# Patient Record
Sex: Female | Born: 1975 | Race: White | Hispanic: No | Marital: Married | Smoking: Never smoker
Health system: Southern US, Community
[De-identification: ages and names within clinical notes are randomized; demographics above are authoritative.]

## PROBLEM LIST (undated history)

## (undated) DIAGNOSIS — R51 Headache: Secondary | ICD-10-CM

## (undated) DIAGNOSIS — R12 Heartburn: Secondary | ICD-10-CM

## (undated) DIAGNOSIS — Z87410 Personal history of cervical dysplasia: Secondary | ICD-10-CM

## (undated) DIAGNOSIS — K219 Gastro-esophageal reflux disease without esophagitis: Secondary | ICD-10-CM

## (undated) DIAGNOSIS — C801 Malignant (primary) neoplasm, unspecified: Secondary | ICD-10-CM

## (undated) DIAGNOSIS — N893 Dysplasia of vagina, unspecified: Secondary | ICD-10-CM

## (undated) DIAGNOSIS — Z86018 Personal history of other benign neoplasm: Secondary | ICD-10-CM

## (undated) DIAGNOSIS — D179 Benign lipomatous neoplasm, unspecified: Secondary | ICD-10-CM

## (undated) DIAGNOSIS — R519 Headache, unspecified: Secondary | ICD-10-CM

## (undated) HISTORY — DX: Benign lipomatous neoplasm, unspecified: D17.9

## (undated) HISTORY — DX: Heartburn: R12

## (undated) HISTORY — PX: DILATION AND CURETTAGE OF UTERUS: SHX78

## (undated) HISTORY — PX: LUMBAR DISC SURGERY: SHX700

## (undated) HISTORY — PX: LAPAROSCOPIC SALPINGO OOPHERECTOMY: SHX5927

## (undated) HISTORY — PX: WISDOM TOOTH EXTRACTION: SHX21

## (undated) HISTORY — DX: Dysplasia of vagina, unspecified: N89.3

## (undated) HISTORY — PX: LAPAROSCOPIC TUBAL LIGATION W/ FILCHIE CLIPS: SHX1938

---

## 1995-06-25 HISTORY — PX: WISDOM TOOTH EXTRACTION: SHX21

## 1999-10-30 ENCOUNTER — Other Ambulatory Visit: Admission: RE | Admit: 1999-10-30 | Discharge: 1999-10-30 | Payer: Self-pay | Admitting: Family Medicine

## 2000-03-21 ENCOUNTER — Encounter: Admission: RE | Admit: 2000-03-21 | Discharge: 2000-03-21 | Payer: Self-pay | Admitting: Family Medicine

## 2000-03-21 ENCOUNTER — Encounter: Payer: Self-pay | Admitting: Family Medicine

## 2000-10-29 ENCOUNTER — Other Ambulatory Visit: Admission: RE | Admit: 2000-10-29 | Discharge: 2000-10-29 | Payer: Self-pay | Admitting: Family Medicine

## 2000-12-17 ENCOUNTER — Other Ambulatory Visit: Admission: RE | Admit: 2000-12-17 | Discharge: 2000-12-17 | Payer: Self-pay | Admitting: *Deleted

## 2000-12-18 ENCOUNTER — Encounter (INDEPENDENT_AMBULATORY_CARE_PROVIDER_SITE_OTHER): Payer: Self-pay

## 2000-12-18 ENCOUNTER — Other Ambulatory Visit: Admission: RE | Admit: 2000-12-18 | Discharge: 2000-12-18 | Payer: Self-pay | Admitting: *Deleted

## 2001-09-15 ENCOUNTER — Other Ambulatory Visit: Admission: RE | Admit: 2001-09-15 | Discharge: 2001-09-15 | Payer: Self-pay | Admitting: Family Medicine

## 2002-03-09 ENCOUNTER — Encounter: Payer: Self-pay | Admitting: Family Medicine

## 2002-03-09 ENCOUNTER — Encounter: Admission: RE | Admit: 2002-03-09 | Discharge: 2002-03-09 | Payer: Self-pay | Admitting: Family Medicine

## 2002-03-15 ENCOUNTER — Other Ambulatory Visit: Admission: RE | Admit: 2002-03-15 | Discharge: 2002-03-15 | Payer: Self-pay | Admitting: Obstetrics and Gynecology

## 2002-04-06 ENCOUNTER — Encounter: Payer: Self-pay | Admitting: Orthopaedic Surgery

## 2002-04-06 ENCOUNTER — Ambulatory Visit (HOSPITAL_COMMUNITY): Admission: RE | Admit: 2002-04-06 | Discharge: 2002-04-06 | Payer: Self-pay | Admitting: Orthopaedic Surgery

## 2002-11-25 ENCOUNTER — Other Ambulatory Visit: Admission: RE | Admit: 2002-11-25 | Discharge: 2002-11-25 | Payer: Self-pay | Admitting: Obstetrics and Gynecology

## 2002-11-26 ENCOUNTER — Other Ambulatory Visit: Admission: RE | Admit: 2002-11-26 | Discharge: 2002-11-26 | Payer: Self-pay | Admitting: Obstetrics & Gynecology

## 2003-03-12 ENCOUNTER — Inpatient Hospital Stay (HOSPITAL_COMMUNITY): Admission: AD | Admit: 2003-03-12 | Discharge: 2003-03-12 | Payer: Self-pay | Admitting: Obstetrics and Gynecology

## 2003-04-28 ENCOUNTER — Inpatient Hospital Stay (HOSPITAL_COMMUNITY): Admission: AD | Admit: 2003-04-28 | Discharge: 2003-04-29 | Payer: Self-pay | Admitting: Obstetrics & Gynecology

## 2003-05-24 ENCOUNTER — Inpatient Hospital Stay (HOSPITAL_COMMUNITY): Admission: AD | Admit: 2003-05-24 | Discharge: 2003-05-27 | Payer: Self-pay | Admitting: Obstetrics and Gynecology

## 2003-06-03 ENCOUNTER — Encounter: Admission: RE | Admit: 2003-06-03 | Discharge: 2003-07-03 | Payer: Self-pay | Admitting: Obstetrics and Gynecology

## 2003-12-29 ENCOUNTER — Other Ambulatory Visit: Admission: RE | Admit: 2003-12-29 | Discharge: 2003-12-29 | Payer: Self-pay | Admitting: Obstetrics and Gynecology

## 2004-11-29 ENCOUNTER — Other Ambulatory Visit: Admission: RE | Admit: 2004-11-29 | Discharge: 2004-11-29 | Payer: Self-pay | Admitting: Obstetrics and Gynecology

## 2005-05-25 ENCOUNTER — Emergency Department (HOSPITAL_COMMUNITY): Admission: EM | Admit: 2005-05-25 | Discharge: 2005-05-25 | Payer: Self-pay | Admitting: Emergency Medicine

## 2006-08-30 ENCOUNTER — Encounter
Admission: RE | Admit: 2006-08-30 | Discharge: 2006-08-30 | Payer: Self-pay | Admitting: Physical Medicine and Rehabilitation

## 2006-09-18 ENCOUNTER — Encounter
Admission: RE | Admit: 2006-09-18 | Discharge: 2006-11-06 | Payer: Self-pay | Admitting: Physical Medicine and Rehabilitation

## 2007-01-30 ENCOUNTER — Ambulatory Visit (HOSPITAL_COMMUNITY): Admission: RE | Admit: 2007-01-30 | Discharge: 2007-01-31 | Payer: Self-pay | Admitting: Orthopaedic Surgery

## 2007-01-30 HISTORY — PX: BACK SURGERY: SHX140

## 2007-10-13 ENCOUNTER — Encounter: Admission: RE | Admit: 2007-10-13 | Discharge: 2007-10-13 | Payer: Self-pay | Admitting: Obstetrics & Gynecology

## 2008-12-03 ENCOUNTER — Encounter: Admission: RE | Admit: 2008-12-03 | Discharge: 2008-12-03 | Payer: Self-pay | Admitting: Orthopaedic Surgery

## 2009-01-02 ENCOUNTER — Encounter: Admission: RE | Admit: 2009-01-02 | Discharge: 2009-01-02 | Payer: Self-pay | Admitting: Obstetrics and Gynecology

## 2009-08-05 ENCOUNTER — Ambulatory Visit (HOSPITAL_COMMUNITY): Admission: AD | Admit: 2009-08-05 | Discharge: 2009-08-05 | Payer: Self-pay | Admitting: Obstetrics and Gynecology

## 2009-08-05 HISTORY — PX: DILATION AND CURETTAGE OF UTERUS: SHX78

## 2010-02-12 ENCOUNTER — Encounter: Admission: RE | Admit: 2010-02-12 | Discharge: 2010-02-12 | Payer: Self-pay | Admitting: Obstetrics and Gynecology

## 2010-03-20 ENCOUNTER — Ambulatory Visit (HOSPITAL_COMMUNITY): Admission: RE | Admit: 2010-03-20 | Discharge: 2010-03-20 | Payer: Self-pay | Admitting: Obstetrics and Gynecology

## 2010-03-20 HISTORY — PX: TUBAL LIGATION: SHX77

## 2010-09-06 LAB — CBC
HCT: 44 % (ref 36.0–46.0)
MCV: 92.1 fL (ref 78.0–100.0)
Platelets: 307 10*3/uL (ref 150–400)
RBC: 4.77 MIL/uL (ref 3.87–5.11)
WBC: 7.1 10*3/uL (ref 4.0–10.5)

## 2010-09-06 LAB — SURGICAL PCR SCREEN: MRSA, PCR: NEGATIVE

## 2010-09-06 LAB — PREGNANCY, URINE: Preg Test, Ur: NEGATIVE

## 2010-09-12 LAB — CBC
HCT: 42.1 % (ref 36.0–46.0)
Platelets: 266 10*3/uL (ref 150–400)
WBC: 10 10*3/uL (ref 4.0–10.5)

## 2010-09-12 LAB — DIFFERENTIAL
Basophils Absolute: 0 10*3/uL (ref 0.0–0.1)
Eosinophils Relative: 1 % (ref 0–5)
Lymphocytes Relative: 28 % (ref 12–46)
Lymphs Abs: 2.8 10*3/uL (ref 0.7–4.0)
Neutro Abs: 6.6 10*3/uL (ref 1.7–7.7)
Neutrophils Relative %: 66 % (ref 43–77)

## 2010-09-12 LAB — TYPE AND SCREEN
ABO/RH(D): B POS
Antibody Screen: NEGATIVE

## 2010-09-12 LAB — ABO/RH: ABO/RH(D): B POS

## 2010-10-05 ENCOUNTER — Emergency Department (HOSPITAL_COMMUNITY)
Admission: EM | Admit: 2010-10-05 | Discharge: 2010-10-05 | Disposition: A | Discharge status: Home / Self Care | Payer: Medicaid Other | Attending: Emergency Medicine | Admitting: Emergency Medicine

## 2010-10-05 DIAGNOSIS — R55 Syncope and collapse: Secondary | ICD-10-CM | POA: Insufficient documentation

## 2010-10-05 DIAGNOSIS — R110 Nausea: Secondary | ICD-10-CM | POA: Insufficient documentation

## 2010-10-05 DIAGNOSIS — R42 Dizziness and giddiness: Secondary | ICD-10-CM | POA: Insufficient documentation

## 2010-10-05 DIAGNOSIS — R61 Generalized hyperhidrosis: Secondary | ICD-10-CM | POA: Insufficient documentation

## 2010-10-05 DIAGNOSIS — R5383 Other fatigue: Secondary | ICD-10-CM | POA: Insufficient documentation

## 2010-10-05 DIAGNOSIS — R5381 Other malaise: Secondary | ICD-10-CM | POA: Insufficient documentation

## 2010-10-25 ENCOUNTER — Other Ambulatory Visit (HOSPITAL_COMMUNITY): Payer: Self-pay | Admitting: Family Medicine

## 2010-10-25 DIAGNOSIS — L989 Disorder of the skin and subcutaneous tissue, unspecified: Secondary | ICD-10-CM

## 2010-10-30 ENCOUNTER — Ambulatory Visit (HOSPITAL_COMMUNITY)
Admission: RE | Admit: 2010-10-30 | Discharge: 2010-10-30 | Disposition: A | Discharge status: Home / Self Care | Payer: Medicaid Other | Source: Ambulatory Visit | Attending: Family Medicine | Admitting: Family Medicine

## 2010-10-30 DIAGNOSIS — R229 Localized swelling, mass and lump, unspecified: Secondary | ICD-10-CM | POA: Insufficient documentation

## 2010-10-30 DIAGNOSIS — L989 Disorder of the skin and subcutaneous tissue, unspecified: Secondary | ICD-10-CM

## 2010-11-06 NOTE — Op Note (Signed)
NAMEASHLEY, Maria Velasquez                ACCOUNT NO.:  [id number]   MEDICAL RECORD NO.:  [id number]          PATIENT TYPE:  OIB   LOCATION:  5015                         FACILITY:  [facility]   PHYSICIAN:  Mark C. Ophelia Charter, M.D.    DATE OF BIRTH:  August 09, 1975   DATE OF PROCEDURE:  01/30/2007  DATE OF DISCHARGE:                               OPERATIVE REPORT   PREOPERATIVE DIAGNOSIS:  Left L4-5, L5-S1 herniated nucleus pulposus.   POSTOPERATIVE DIAGNOSIS:  Left L4-5, L5-S1 herniated nucleus pulposus.   PROCEDURES:  1. Left L4-5 microdiskectomy and foraminotomy.  2. Left L5-S1 microdiskectomy and foraminotomy.  (Two-level      procedure).   SURGEON:  Annell Greening, MD.   ANESTHESIA:  GOT plus Marcaine skin local.   ASSISTANT:  Maud Deed, PA-C.   ESTIMATED BLOOD LOSS:  Less than 100 mL.   This 35 year old female has HNP central and left at 4-5, 5-1 with  foraminal stenosis not responsive to a rest, anti-inflammatories, muscle  relaxants, pain medication, physical therapy and repeat epidural steroid  injections.  She has elected to proceed with microdiskectomy and  foraminotomy for relief of the nerve root compression.   PROCEDURE:  After induction of general anesthesia and time-out  procedure, the patient placed in kneeling position on the [place] frame.  Careful padding and positioning.  Area squared with towels after  Betadine preparation with the DuraPrep and Betadine, application  laminectomy sheets, drapes.  Incision was made based on palpable  landmarks.  Exposure of 4-5, 5-1 was performed and a spinal needle was  placed at the 5-1 interspace and Penfield IV was placed down between the  lamina and the 4 and 5 and cross-table lateral x-ray confirmed this  appropriate two levels.  Foraminotomy was performed at both levels, 5-1  level was surgically treated first.  The ligament was divided and then  removed with the [place].  There was significant facet overhang  displacing the nerve  root toward the midline and this was trimmed back.  This nerve root went underneath the pedicle.  There was overhang from  the facets more significant than seen on radiographs.  Disk was bulging  annulus, was incised.  Passes were made with the micropituitary then  regular pituitary.  With the upbiting micropituitary, a large glob of  disk was removed which was left paracentral.  Epstein curettes were  used.  Once the ligament was removed from the gutter and facet overhang  spur, the nerve root was decompressed, a hockey stick was able to sweep  anterior to the dura with no areas of compression.  Small veins were  coagulated.  Once the nerve root was completely free and palpation above  and below, behind and on top of the nerve root had been performed,  a  Taylor retractor was placed at the 4-5 space.  Identical procedure was  performed at this level.  Again there was facet overhang that was  causing nerve root compression in addition to the bulging disk which was  left paracentral.  Annulus was incised and disk material was removed as  well  as the ligaments in the lateral gutter.  There were multiple small  veins requiring use of the bipolar.  The nerve hook was used for  palpation along the lateral edge and bone was removed out to the level  of the pedicle.  Foramen was enlarged, nerve root was free, no bulging  of the disk in the midline after slipping underneath the ligament and  pushing down the disk material in the center of the disk and then  removing it with the pituitaries.  A 180 degree sweep was made anterior  to the dura, no areas of compression, nerve root was free.  After  irrigation with saline solution, operative field was dry.  Deep fascia  was closed with 0 Vicryl, 2-0 Vicryl in subcutaneous tissue, 4-0 Vicryl  subcuticular closure.  Tincture of Benzoin and Steri-Strips, Marcaine  infiltration, postop dressing and transferred to her room.  Instrument,  needle count and  pad count was correct.      Mark C. Ophelia Charter, M.D.  Electronically Signed     MCY/MEDQ  D:  01/30/2007  T:  01/30/2007  Job:  [id number]

## 2010-12-10 ENCOUNTER — Other Ambulatory Visit: Payer: Self-pay | Admitting: Obstetrics and Gynecology

## 2010-12-10 DIAGNOSIS — N6320 Unspecified lump in the left breast, unspecified quadrant: Secondary | ICD-10-CM

## 2010-12-17 ENCOUNTER — Ambulatory Visit
Admission: RE | Admit: 2010-12-17 | Discharge: 2010-12-17 | Disposition: A | Discharge status: Home / Self Care | Payer: Medicaid Other | Source: Ambulatory Visit | Attending: Obstetrics and Gynecology | Admitting: Obstetrics and Gynecology

## 2010-12-17 DIAGNOSIS — N6320 Unspecified lump in the left breast, unspecified quadrant: Secondary | ICD-10-CM

## 2011-01-18 ENCOUNTER — Other Ambulatory Visit: Payer: Self-pay | Admitting: Obstetrics and Gynecology

## 2011-01-18 DIAGNOSIS — Z1231 Encounter for screening mammogram for malignant neoplasm of breast: Secondary | ICD-10-CM

## 2011-02-14 ENCOUNTER — Ambulatory Visit
Admission: RE | Admit: 2011-02-14 | Discharge: 2011-02-14 | Disposition: A | Discharge status: Home / Self Care | Payer: Medicaid Other | Source: Ambulatory Visit | Attending: Obstetrics and Gynecology | Admitting: Obstetrics and Gynecology

## 2011-02-14 DIAGNOSIS — Z1231 Encounter for screening mammogram for malignant neoplasm of breast: Secondary | ICD-10-CM

## 2011-04-08 LAB — COMPREHENSIVE METABOLIC PANEL
AST: 21
Albumin: 4.4
Alkaline Phosphatase: 74
Chloride: 104
GFR calc Af Amer: >60
Potassium: 4.4
Sodium: 138
Total Bilirubin: 0.7
Total Protein: 7

## 2011-04-08 LAB — URINE MICROSCOPIC-ADD ON

## 2011-04-08 LAB — DIFFERENTIAL
Basophils Absolute: 0
Basophils Relative: 0
Eosinophils Relative: 1
Monocytes Absolute: 0.5
Monocytes Relative: 6
Neutro Abs: 5

## 2011-04-08 LAB — CBC
Platelets: 326
RDW: 12.3
WBC: 8.7

## 2011-04-08 LAB — URINALYSIS, ROUTINE W REFLEX MICROSCOPIC
Bilirubin Urine: NEGATIVE
Hgb urine dipstick: NEGATIVE
Nitrite: NEGATIVE
Specific Gravity, Urine: 1.02
Urobilinogen, UA: 0.2
pH: 6

## 2011-04-08 LAB — APTT: aPTT: 28

## 2011-04-08 LAB — PROTIME-INR: INR: 0.9

## 2012-01-14 ENCOUNTER — Other Ambulatory Visit: Payer: Self-pay | Admitting: Obstetrics and Gynecology

## 2012-01-14 DIAGNOSIS — Z1231 Encounter for screening mammogram for malignant neoplasm of breast: Secondary | ICD-10-CM

## 2012-01-27 ENCOUNTER — Other Ambulatory Visit (HOSPITAL_COMMUNITY): Payer: Self-pay | Admitting: *Deleted

## 2012-01-27 DIAGNOSIS — G43909 Migraine, unspecified, not intractable, without status migrainosus: Secondary | ICD-10-CM

## 2012-01-29 ENCOUNTER — Ambulatory Visit (HOSPITAL_COMMUNITY): Payer: Medicaid Other

## 2012-01-31 ENCOUNTER — Ambulatory Visit (HOSPITAL_COMMUNITY)
Admission: RE | Admit: 2012-01-31 | Discharge: 2012-01-31 | Disposition: A | Discharge status: Home / Self Care | Payer: Medicaid Other | Source: Ambulatory Visit | Attending: *Deleted | Admitting: *Deleted

## 2012-01-31 DIAGNOSIS — R209 Unspecified disturbances of skin sensation: Secondary | ICD-10-CM | POA: Insufficient documentation

## 2012-01-31 DIAGNOSIS — G43909 Migraine, unspecified, not intractable, without status migrainosus: Secondary | ICD-10-CM | POA: Insufficient documentation

## 2012-02-21 ENCOUNTER — Ambulatory Visit: Payer: Medicaid Other

## 2012-02-25 ENCOUNTER — Ambulatory Visit: Payer: Medicaid Other

## 2012-03-09 ENCOUNTER — Ambulatory Visit
Admission: RE | Admit: 2012-03-09 | Discharge: 2012-03-09 | Disposition: A | Discharge status: Home / Self Care | Payer: Medicaid Other | Source: Ambulatory Visit | Attending: Obstetrics and Gynecology | Admitting: Obstetrics and Gynecology

## 2012-03-09 DIAGNOSIS — Z1231 Encounter for screening mammogram for malignant neoplasm of breast: Secondary | ICD-10-CM

## 2013-02-24 ENCOUNTER — Other Ambulatory Visit: Payer: Self-pay | Admitting: Obstetrics and Gynecology

## 2013-02-24 DIAGNOSIS — N63 Unspecified lump in unspecified breast: Secondary | ICD-10-CM

## 2013-03-16 ENCOUNTER — Ambulatory Visit
Admission: RE | Admit: 2013-03-16 | Discharge: 2013-03-16 | Disposition: A | Discharge status: Home / Self Care | Payer: Medicaid Other | Source: Ambulatory Visit | Attending: Obstetrics and Gynecology | Admitting: Obstetrics and Gynecology

## 2013-03-16 ENCOUNTER — Other Ambulatory Visit: Payer: Self-pay | Admitting: Obstetrics and Gynecology

## 2013-03-16 DIAGNOSIS — N63 Unspecified lump in unspecified breast: Secondary | ICD-10-CM

## 2014-03-01 ENCOUNTER — Other Ambulatory Visit: Payer: Self-pay

## 2014-03-01 DIAGNOSIS — Z1231 Encounter for screening mammogram for malignant neoplasm of breast: Secondary | ICD-10-CM

## 2014-03-21 ENCOUNTER — Ambulatory Visit
Admission: RE | Admit: 2014-03-21 | Discharge: 2014-03-21 | Disposition: A | Discharge status: Home / Self Care | Payer: No Typology Code available for payment source | Source: Ambulatory Visit

## 2014-03-21 DIAGNOSIS — Z1231 Encounter for screening mammogram for malignant neoplasm of breast: Secondary | ICD-10-CM

## 2014-11-04 ENCOUNTER — Other Ambulatory Visit: Payer: Self-pay | Admitting: Obstetrics and Gynecology

## 2014-11-12 NOTE — Patient Instructions (Addendum)
   Your procedure is scheduled on:  Wednesday, May 25  Enter through the [employer] [facility] at: 9:45 AM Pick up the phone at the desk and dial [phone] and inform us of your arrival.  Please call this number if you have any problems the morning of surgery: [phone]  Remember: Do not eat or drink after midnight: Tuesday Take these medicines the morning of surgery with a SIP OF WATER:  NONE  Do not wear jewelry, make-up, or FINGER nail polish No metal in your hair or on your body. Do not wear lotions, powders, perfumes.  You may wear deodorant.  Do not bring valuables to the hospital. Contacts, dentures or bridgework may not be worn into surgery.  Leave suitcase in the car. After Surgery it may be brought to your room. For patients being admitted to the hospital, checkout time is 11:00am the day of discharge.  Home with [place] cell [phone].

## 2014-11-15 ENCOUNTER — Encounter (HOSPITAL_COMMUNITY)
Admission: RE | Admit: 2014-11-15 | Discharge: 2014-11-15 | Disposition: A | Discharge status: Home / Self Care | Payer: Medicaid Other | Source: Ambulatory Visit | Attending: Obstetrics and Gynecology | Admitting: Obstetrics and Gynecology

## 2014-11-15 ENCOUNTER — Encounter (HOSPITAL_COMMUNITY): Payer: Self-pay

## 2014-11-15 DIAGNOSIS — D259 Leiomyoma of uterus, unspecified: Secondary | ICD-10-CM | POA: Insufficient documentation

## 2014-11-15 DIAGNOSIS — Z01818 Encounter for other preprocedural examination: Secondary | ICD-10-CM | POA: Insufficient documentation

## 2014-11-15 HISTORY — DX: Headache, unspecified: R51.9

## 2014-11-15 HISTORY — DX: Headache: R51

## 2014-11-15 LAB — CBC
HEMATOCRIT: 41.2 % (ref 36.0–46.0)
HEMOGLOBIN: 14.8 g/dL (ref 12.0–15.0)
MCH: 31.4 pg (ref 26.0–34.0)
MCHC: 35.9 g/dL (ref 30.0–36.0)
MCV: 87.5 fL (ref 78.0–100.0)
PLATELETS: 277 10*3/uL (ref 150–400)
RBC: 4.71 MIL/uL (ref 3.87–5.11)
RDW: 12.6 % (ref 11.5–15.5)
WBC: 7.1 10*3/uL (ref 4.0–10.5)

## 2014-11-15 MED ORDER — DEXTROSE 5 % IV SOLN
2.0000 g | INTRAVENOUS | Status: AC
  Administered 2014-11-16: 2 g via INTRAVENOUS
  Filled 2014-11-15: qty 2

## 2014-11-15 NOTE — H&P (Signed)
39 y.o. yo complains of menometrarrhagia.  Bled since last visit- irregular bleeding before this. Having severe pain with bleeding. Has had irregular bleeding and has thickened endometrium. Allergic to Percocet- feels like bugs crawling. Will do Vicodin. EMB was benign.  Now periods lasting total 6 days and then stop for 2 days- then restart with just spotting for another week. HA coming with periods. Unable to have SA secondary bleeding and pain like he is hitting something. Non smoker. Wanted her to try OCPs in 2013 but didn't take them. Getting married in September.   [place] 8x6x8; three fibroids 2-4 cm. EM 1.62 cm. Ovaries normal with no cysts 2 cm each.   Past Medical History  Diagnosis Date  . SVD (spontaneous vaginal delivery)     x 1  . Headache     otc med prn   Past Surgical History  Procedure Laterality Date  . Dilation and curettage of uterus  08/05/2009    missed abortion  . Back surgery  01/30/2007    L4-L5, L5-S1 herniated disk  . Wisdom tooth extraction    . Tubal ligation  03/20/2010    filshie clips    History   Social History  . Marital Status: Single    Spouse Name: N/A  . Number of Children: N/A  . Years of Education: N/A   Occupational History  . Not on file.   Social History Main Topics  . Smoking status: Never Smoker   . Smokeless tobacco: Never Used  . Alcohol Use: Yes     Comment: socially  . Drug Use: No  . Sexual Activity: Yes    Birth Control/ Protection: Surgical   Other Topics Concern  . Not on file   Social History Narrative  . No narrative on file    No current facility-administered medications on file prior to encounter.   No current outpatient prescriptions on file prior to encounter.    Allergies  Allergen Reactions  . Oxycodone Itching and Other (See Comments)    Causes the sensation of skin crawling.    @VITALS2 @  Lungs: clear to ascultation Cor:  RRR Abdomen:  soft, nontender, nondistended. Ex:  no cords,  erythema Pelvic:  NEFG. 10 week size uterus.   A:  Menometrarrhagia and unable to tolerate OCPs.  Fibroids and desires definitive therapy especially since she has dyspareunia.  For Robo TLH/salpingectomies/poss BSO.   P: All risks, benefits and alternatives d/w patient and she desires to proceed.  Patient has undergone a modified bowel prep and will receive preop antibiotics and SCDs during the operation.     Vauda Salvucci A

## 2014-11-16 ENCOUNTER — Ambulatory Visit (HOSPITAL_COMMUNITY): Payer: Medicaid Other

## 2014-11-16 ENCOUNTER — Encounter (HOSPITAL_COMMUNITY): Payer: Self-pay | Admitting: Anesthesiology

## 2014-11-16 ENCOUNTER — Encounter (HOSPITAL_COMMUNITY)
Admission: RE | Disposition: A | Discharge status: Home / Self Care | Payer: Self-pay | Source: Ambulatory Visit | Attending: Obstetrics and Gynecology

## 2014-11-16 ENCOUNTER — Ambulatory Visit (HOSPITAL_COMMUNITY)
Admission: RE | Admit: 2014-11-16 | Discharge: 2014-11-17 | Disposition: A | Discharge status: Home / Self Care | Payer: Medicaid Other | Source: Ambulatory Visit | Attending: Obstetrics and Gynecology | Admitting: Obstetrics and Gynecology

## 2014-11-16 DIAGNOSIS — N921 Excessive and frequent menstruation with irregular cycle: Secondary | ICD-10-CM | POA: Insufficient documentation

## 2014-11-16 DIAGNOSIS — Z885 Allergy status to narcotic agent status: Secondary | ICD-10-CM | POA: Diagnosis not present

## 2014-11-16 DIAGNOSIS — D259 Leiomyoma of uterus, unspecified: Secondary | ICD-10-CM | POA: Diagnosis not present

## 2014-11-16 DIAGNOSIS — N840 Polyp of corpus uteri: Secondary | ICD-10-CM | POA: Insufficient documentation

## 2014-11-16 DIAGNOSIS — Z9889 Other specified postprocedural states: Secondary | ICD-10-CM

## 2014-11-16 DIAGNOSIS — N838 Other noninflammatory disorders of ovary, fallopian tube and broad ligament: Secondary | ICD-10-CM | POA: Insufficient documentation

## 2014-11-16 HISTORY — PX: ROBOTIC ASSISTED BILATERAL SALPINGO OOPHERECTOMY: SHX6078

## 2014-11-16 HISTORY — PX: ROBOTIC ASSISTED TOTAL HYSTERECTOMY: SHX6085

## 2014-11-16 LAB — PREGNANCY, URINE: Preg Test, Ur: NEGATIVE

## 2014-11-16 SURGERY — ROBOTIC ASSISTED TOTAL HYSTERECTOMY
Anesthesia: Choice

## 2014-11-16 MED ORDER — NEOSTIGMINE METHYLSULFATE 10 MG/10ML IV SOLN
INTRAVENOUS | Status: AC
  Filled 2014-11-16: qty 1

## 2014-11-16 MED ORDER — ONDANSETRON HCL 4 MG PO TABS: 4.0000 mg | ORAL_TABLET | Freq: Four times a day (QID) | ORAL | Status: DC | PRN

## 2014-11-16 MED ORDER — MIDAZOLAM HCL 2 MG/2ML IJ SOLN
INTRAMUSCULAR | Status: AC
  Filled 2014-11-16: qty 2

## 2014-11-16 MED ORDER — ONDANSETRON HCL 4 MG/2ML IJ SOLN
INTRAMUSCULAR | Status: AC
  Filled 2014-11-16: qty 2

## 2014-11-16 MED ORDER — FENTANYL CITRATE (PF) 250 MCG/5ML IJ SOLN
INTRAMUSCULAR | Status: AC
  Filled 2014-11-16: qty 5

## 2014-11-16 MED ORDER — FENTANYL CITRATE (PF) 100 MCG/2ML IJ SOLN
INTRAMUSCULAR | Status: AC
  Administered 2014-11-16: 25 ug via INTRAVENOUS
  Filled 2014-11-16: qty 2

## 2014-11-16 MED ORDER — SODIUM CHLORIDE 0.9 % IJ SOLN
INTRAMUSCULAR | Status: AC
  Filled 2014-11-16: qty 50

## 2014-11-16 MED ORDER — BUPIVACAINE LIPOSOME 1.3 % IJ SUSP
INTRAMUSCULAR | Status: DC | PRN
  Administered 2014-11-16: 50 mL

## 2014-11-16 MED ORDER — METOCLOPRAMIDE HCL 5 MG/ML IJ SOLN: 10.0000 mg | Freq: Once | INTRAMUSCULAR | Status: DC | PRN

## 2014-11-16 MED ORDER — ONDANSETRON HCL 4 MG/2ML IJ SOLN: 4.0000 mg | Freq: Four times a day (QID) | INTRAMUSCULAR | Status: DC | PRN

## 2014-11-16 MED ORDER — SCOPOLAMINE 1 MG/3DAYS TD PT72
1.0000 | MEDICATED_PATCH | Freq: Once | TRANSDERMAL | Status: DC
  Administered 2014-11-16: 1.5 mg via TRANSDERMAL

## 2014-11-16 MED ORDER — FENTANYL CITRATE (PF) 100 MCG/2ML IJ SOLN
INTRAMUSCULAR | Status: DC | PRN
  Administered 2014-11-16: 50 ug via INTRAVENOUS
  Administered 2014-11-16: 100 ug via INTRAVENOUS
  Administered 2014-11-16: 50 ug via INTRAVENOUS
  Administered 2014-11-16: 100 ug via INTRAVENOUS
  Administered 2014-11-16 (×2): 50 ug via INTRAVENOUS

## 2014-11-16 MED ORDER — STERILE WATER FOR IRRIGATION IR SOLN
Status: DC | PRN
  Administered 2014-11-16: 1000 mL

## 2014-11-16 MED ORDER — IBUPROFEN 800 MG PO TABS
800.0000 mg | ORAL_TABLET | Freq: Three times a day (TID) | ORAL | Status: DC | PRN
  Administered 2014-11-16 – 2014-11-17 (×3): 800 mg via ORAL
  Filled 2014-11-16 (×3): qty 1

## 2014-11-16 MED ORDER — DEXAMETHASONE SODIUM PHOSPHATE 10 MG/ML IJ SOLN
INTRAMUSCULAR | Status: DC | PRN
  Administered 2014-11-16: 4 mg via INTRAVENOUS

## 2014-11-16 MED ORDER — FENTANYL CITRATE (PF) 100 MCG/2ML IJ SOLN
INTRAMUSCULAR | Status: AC
  Filled 2014-11-16: qty 2

## 2014-11-16 MED ORDER — DEXAMETHASONE SODIUM PHOSPHATE 4 MG/ML IJ SOLN
INTRAMUSCULAR | Status: AC
  Filled 2014-11-16: qty 1

## 2014-11-16 MED ORDER — LIDOCAINE HCL (CARDIAC) 20 MG/ML IV SOLN
INTRAVENOUS | Status: DC | PRN
  Administered 2014-11-16: 50 mg via INTRAVENOUS

## 2014-11-16 MED ORDER — MENTHOL 3 MG MT LOZG
1.0000 | LOZENGE | OROMUCOSAL | Status: DC | PRN
  Filled 2014-11-16: qty 9

## 2014-11-16 MED ORDER — BUPIVACAINE LIPOSOME 1.3 % IJ SUSP
20.0000 mL | Freq: Once | INTRAMUSCULAR | Status: DC
  Filled 2014-11-16: qty 20

## 2014-11-16 MED ORDER — GLYCOPYRROLATE 0.2 MG/ML IJ SOLN
INTRAMUSCULAR | Status: DC | PRN
  Administered 2014-11-16: .8 mg via INTRAVENOUS

## 2014-11-16 MED ORDER — GLYCOPYRROLATE 0.2 MG/ML IJ SOLN
INTRAMUSCULAR | Status: AC
  Filled 2014-11-16: qty 5

## 2014-11-16 MED ORDER — FENTANYL CITRATE (PF) 100 MCG/2ML IJ SOLN
25.0000 ug | INTRAMUSCULAR | Status: DC | PRN
  Administered 2014-11-16 (×2): 50 ug via INTRAVENOUS
  Administered 2014-11-16 (×2): 25 ug via INTRAVENOUS

## 2014-11-16 MED ORDER — HYDROCODONE-ACETAMINOPHEN 5-325 MG PO TABS
1.0000 | ORAL_TABLET | ORAL | Status: DC | PRN
  Administered 2014-11-16 – 2014-11-17 (×4): 2 via ORAL
  Filled 2014-11-16 (×4): qty 2

## 2014-11-16 MED ORDER — ROCURONIUM BROMIDE 100 MG/10ML IV SOLN
INTRAVENOUS | Status: DC | PRN
  Administered 2014-11-16: 50 mg via INTRAVENOUS
  Administered 2014-11-16: 10 mg via INTRAVENOUS

## 2014-11-16 MED ORDER — PROPOFOL 10 MG/ML IV BOLUS
INTRAVENOUS | Status: AC
  Filled 2014-11-16: qty 20

## 2014-11-16 MED ORDER — LACTATED RINGERS IR SOLN
Status: DC | PRN
  Administered 2014-11-16: 3000 mL

## 2014-11-16 MED ORDER — NEOSTIGMINE METHYLSULFATE 10 MG/10ML IV SOLN
INTRAVENOUS | Status: DC | PRN
Start: 2014-11-16 — End: 2014-11-16
  Administered 2014-11-16: 4 mg via INTRAVENOUS

## 2014-11-16 MED ORDER — ONDANSETRON HCL 4 MG/2ML IJ SOLN
INTRAMUSCULAR | Status: DC | PRN
  Administered 2014-11-16: 4 mg via INTRAVENOUS

## 2014-11-16 MED ORDER — KETOROLAC TROMETHAMINE 30 MG/ML IJ SOLN
INTRAMUSCULAR | Status: DC | PRN
Start: 2014-11-16 — End: 2014-11-16
  Administered 2014-11-16: 30 mg via INTRAVENOUS

## 2014-11-16 MED ORDER — KETOROLAC TROMETHAMINE 30 MG/ML IJ SOLN: 30.0000 mg | Freq: Four times a day (QID) | INTRAMUSCULAR | Status: DC

## 2014-11-16 MED ORDER — ROCURONIUM BROMIDE 100 MG/10ML IV SOLN
INTRAVENOUS | Status: AC
  Filled 2014-11-16: qty 1

## 2014-11-16 MED ORDER — SCOPOLAMINE 1 MG/3DAYS TD PT72
MEDICATED_PATCH | TRANSDERMAL | Status: AC
  Administered 2014-11-16: 1.5 mg via TRANSDERMAL
  Filled 2014-11-16: qty 1

## 2014-11-16 MED ORDER — LIDOCAINE HCL (CARDIAC) 20 MG/ML IV SOLN
INTRAVENOUS | Status: AC
  Filled 2014-11-16: qty 5

## 2014-11-16 MED ORDER — KETOROLAC TROMETHAMINE 30 MG/ML IJ SOLN
INTRAMUSCULAR | Status: AC
  Filled 2014-11-16: qty 1

## 2014-11-16 MED ORDER — MEPERIDINE HCL 25 MG/ML IJ SOLN: 6.2500 mg | INTRAMUSCULAR | Status: DC | PRN

## 2014-11-16 MED ORDER — PROPOFOL 10 MG/ML IV BOLUS
INTRAVENOUS | Status: DC | PRN
  Administered 2014-11-16: 200 mg via INTRAVENOUS

## 2014-11-16 MED ORDER — LACTATED RINGERS IV SOLN
INTRAVENOUS | Status: DC
  Administered 2014-11-16 (×2): via INTRAVENOUS
  Administered 2014-11-16: 10 mL/h via INTRAVENOUS

## 2014-11-16 MED ORDER — MIDAZOLAM HCL 2 MG/2ML IJ SOLN
INTRAMUSCULAR | Status: DC | PRN
  Administered 2014-11-16: 2 mg via INTRAVENOUS

## 2014-11-16 SURGICAL SUPPLY — 77 items
APL SKNCLS STERI-STRIP NONHPOA (GAUZE/BANDAGES/DRESSINGS) ×2
BARRIER ADHS 3X4 INTERCEED (GAUZE/BANDAGES/DRESSINGS) ×3 IMPLANT
BENZOIN TINCTURE PRP APPL 2/3 (GAUZE/BANDAGES/DRESSINGS) ×3 IMPLANT
BRR ADH 4X3 ABS CNTRL BYND (GAUZE/BANDAGES/DRESSINGS) ×2
CHLORAPREP W/TINT 26ML (MISCELLANEOUS) ×3 IMPLANT
CLOTH BEACON ORANGE TIMEOUT ST (SAFETY) ×3 IMPLANT
CONT PATH 16OZ SNAP LID 3702 (MISCELLANEOUS) ×3 IMPLANT
COVER BACK TABLE 60X90IN (DRAPES) ×6 IMPLANT
COVER TIP SHEARS 8 DVNC (MISCELLANEOUS) ×2 IMPLANT
COVER TIP SHEARS 8MM DA VINCI (MISCELLANEOUS) ×1
DECANTER SPIKE VIAL GLASS SM (MISCELLANEOUS) ×3 IMPLANT
DRAPE WARM FLUID 44X44 (DRAPE) ×3 IMPLANT
DRSG COVADERM PLUS 2X2 (GAUZE/BANDAGES/DRESSINGS) ×12 IMPLANT
DRSG OPSITE POSTOP 3X4 (GAUZE/BANDAGES/DRESSINGS) ×3 IMPLANT
DURAPREP 26ML APPLICATOR (WOUND CARE) ×3 IMPLANT
ELECT REM PT RETURN 9FT ADLT (ELECTROSURGICAL) ×3
ELECTRODE REM PT RTRN 9FT ADLT (ELECTROSURGICAL) ×2 IMPLANT
GAUZE VASELINE 3X9 (GAUZE/BANDAGES/DRESSINGS) IMPLANT
GLOVE BIO SURGEON STRL SZ 6.5 (GLOVE) ×3 IMPLANT
GLOVE BIO SURGEON STRL SZ7 (GLOVE) ×6 IMPLANT
GLOVE BIOGEL PI IND STRL 6.5 (GLOVE) ×2 IMPLANT
GLOVE BIOGEL PI IND STRL 7.0 (GLOVE) ×4 IMPLANT
GLOVE BIOGEL PI INDICATOR 6.5 (GLOVE) ×1
GLOVE BIOGEL PI INDICATOR 7.0 (GLOVE) ×2
GLOVE ECLIPSE 6.5 STRL STRAW (GLOVE) ×9 IMPLANT
GRASPER BIPOLAR FEN DA VINCI (INSTRUMENTS)
GRASPER BPLR FEN DVNC (INSTRUMENTS) IMPLANT
GYRUS RUMI II 2.5CM BLUE (DISPOSABLE)
GYRUS RUMI II 3.5CM BLUE (DISPOSABLE)
GYRUS RUMI II 4.0CM BLUE (DISPOSABLE)
KIT ACCESSORY DA VINCI DISP (KITS) ×1
KIT ACCESSORY DVNC DISP (KITS) ×2 IMPLANT
LEGGING LITHOTOMY PAIR STRL (DRAPES) ×3 IMPLANT
LIQUID BAND (GAUZE/BANDAGES/DRESSINGS) ×3 IMPLANT
MANIPULATOR UTERINE 4.5 ZUMI (MISCELLANEOUS) IMPLANT
NEEDLE INSUFFLATION 120MM (ENDOMECHANICALS) ×3 IMPLANT
NS IRRIG 1000ML POUR BTL (IV SOLUTION) ×9 IMPLANT
OCCLUDER COLPOPNEUMO (BALLOONS) ×6 IMPLANT
PACK ROBOT WH (CUSTOM PROCEDURE TRAY) ×3 IMPLANT
PACK ROBOTIC GOWN (GOWN DISPOSABLE) ×3 IMPLANT
PAD POSITIONER PINK NONSTERILE (MISCELLANEOUS) ×3 IMPLANT
PAD PREP 24X48 CUFFED NSTRL (MISCELLANEOUS) ×6 IMPLANT
RUMI II 3.0CM BLUE KOH-EFFICIE (DISPOSABLE) ×3 IMPLANT
RUMI II GYRUS 2.5CM BLUE (DISPOSABLE) IMPLANT
RUMI II GYRUS 3.5CM BLUE (DISPOSABLE) IMPLANT
RUMI II GYRUS 4.0CM BLUE (DISPOSABLE) IMPLANT
SET CYSTO W/LG BORE CLAMP LF (SET/KITS/TRAYS/PACK) ×3 IMPLANT
SET IRRIG TUBING LAPAROSCOPIC (IRRIGATION / IRRIGATOR) ×3 IMPLANT
SET TRI-LUMEN FLTR TB AIRSEAL (TUBING) ×3 IMPLANT
STRIP CLOSURE SKIN 1/2X4 (GAUZE/BANDAGES/DRESSINGS) ×3 IMPLANT
SUT DVC VLOC 180 0 12IN GS21 (SUTURE)
SUT VIC AB 0 CT1 27 (SUTURE) ×15
SUT VIC AB 0 CT1 27XBRD ANBCTR (SUTURE) ×10 IMPLANT
SUT VIC AB 2-0 CT2 27 (SUTURE) ×6 IMPLANT
SUT VIC AB 2-0 UR6 27 (SUTURE) ×3 IMPLANT
SUT VICRYL 0 UR6 27IN ABS (SUTURE) ×6 IMPLANT
SUT VICRYL RAPIDE 3 0 (SUTURE) ×6 IMPLANT
SUT VLOC 180 0 9IN  GS21 (SUTURE)
SUT VLOC 180 0 9IN GS21 (SUTURE) IMPLANT
SUTURE DVC VLC 180 0 12IN GS21 (SUTURE) IMPLANT
SYR 50ML LL SCALE MARK (SYRINGE) ×3 IMPLANT
SYSTEM CONVERTIBLE TROCAR (TROCAR) IMPLANT
TIP RUMI ORANGE 6.7MMX12CM (TIP) IMPLANT
TIP UTERINE 5.1X6CM LAV DISP (MISCELLANEOUS) IMPLANT
TIP UTERINE 6.7X10CM GRN DISP (MISCELLANEOUS) ×3 IMPLANT
TIP UTERINE 6.7X6CM WHT DISP (MISCELLANEOUS) IMPLANT
TIP UTERINE 6.7X8CM BLUE DISP (MISCELLANEOUS) IMPLANT
TOWEL OR 17X24 6PK STRL BLUE (TOWEL DISPOSABLE) ×6 IMPLANT
TRAY FOLEY CATH SILVER 14FR (SET/KITS/TRAYS/PACK) ×3 IMPLANT
TROCAR DILATING TIP 12MM 150MM (ENDOMECHANICALS) ×6 IMPLANT
TROCAR DISP BLADELESS 8 DVNC (TROCAR) ×2 IMPLANT
TROCAR DISP BLADELESS 8MM (TROCAR) ×1
TROCAR OPTI TIP 12M 100M (ENDOMECHANICALS) IMPLANT
TROCAR PORT AIRSEAL 5X120 (TROCAR) ×3 IMPLANT
TROCAR XCEL 12X100 BLDLESS (ENDOMECHANICALS) ×3 IMPLANT
TROCAR XCEL NON-BLD 5MMX100MML (ENDOMECHANICALS) ×3 IMPLANT
WATER STERILE IRR 1000ML POUR (IV SOLUTION) ×9 IMPLANT

## 2014-11-16 NOTE — Progress Notes (Signed)
There has been no change in the patients history, status or exam since the history and physical.  There were no vitals filed for this visit.  Lab Results  Component Value Date   WBC 7.1 11/15/2014   HGB 14.8 11/15/2014   HCT 41.2 11/15/2014   MCV 87.5 11/15/2014   PLT 277 11/15/2014    Perez Dirico A

## 2014-11-16 NOTE — Brief Op Note (Signed)
11/16/2014  11:57 AM  PATIENT:  Maria Velasquez  39 y.o. female  PRE-OPERATIVE DIAGNOSIS:  FIBROIDS  POST-OPERATIVE DIAGNOSIS:  fibroids  PROCEDURE:  Procedure(s) with comments: ROBOTIC ASSISTED TOTAL HYSTERECTOMY (N/A) - TO FOLLOW FIRDT ROBOTIC CASE. tIME NEEDS TO BE REDUCED TO 3 HOURS. ROBOTIC ASSISTED BILATERAL SALPINGECTOMY (Bilateral)  SURGEON:  Surgeon(s) and Role:    * Bobbye Charleston, MD - Primary    * Allyn Kenner, DO - Assisting   ANESTHESIA:   general  EBL:  Total I/O In: 1000 [I.V.:1000] Out: 200 [Blood:200]  LOCAL MEDICATIONS USED:  OTHER Exparel  SPECIMEN:  Source of Specimen:  uterus, cervix, bilateral tubes  DISPOSITION OF SPECIMEN:  PATHOLOGY  COUNTS:  YES  TOURNIQUET:  * No tourniquets in log *  DICTATION: .Note written in EPIC  PLAN OF CARE: Admit for overnight observation  PATIENT DISPOSITION:  PACU - hemodynamically stable.   Delay start of Pharmacological VTE agent (>24hrs) due to surgical blood loss or risk of bleeding: not applicable  Complications:  None.  Findings:  8 weeks size uterus.  R ovary was normal; R tube had small clear plaques.  L ovary had a small cyst.  The ureters were identified during multiple points of the case and were always out of the field of dissection.  On cystoscopy, the bladder was intact and bilateral spill was seen from each ureteral orriface.    Medications:  Ancef.  Exparel.    Technique:  After adequate anesthesia was achieved the patient was positioned, prepped and draped in usual sterile fashion.  A speculum was placed in the vagina and the cervix dilated with pratt dilators.  The 8 cm Rumi and 3 cm Koh ring were assembled and placed in proper fashion.  The  Speculum was removed and the bladder catheterized with a foley.    Attention was turned to the abdomen where a 1 cm incision was made 1 cm above the umbilicus.  The veress needle was introduced without aspiration of bowel contents or blood and the  abdomen insufflated. The long 12 mm trocar was placed and the other three trocar sites were marked out, all approximately 10 cm from each other and the camera.  Two 8.5 mm trocars were placed on either side of the camera port and a 5 mm assistant port was placed 3 cm above the L iliac crest.  All trocars were inserted under direct visualization of the camera.  The patient was placed in trendelenburg and then the Robot docked.  The PK forceps were placed on arm 2 and the Hot shears on arm 1 and introduced under direct visualization of the camera.  I then broke scrub and sat down at the console.  The above findings were noted and the ureters identified well out of the field of dissection.  The ureters were identified and dissected and follwed to the uterine arteries. The right fallopian tube was isolated and cauterized with the PK.  The Utero-ovarian ligament was then divided with the PK cautery and shears.  The posterior broad ligament was then divided with the hot shears until the uterosacral ligament.  The Broad and cardinal ligaments were then cauterized against the cervix to the level of the Koh ring, securing the uterine artery.  Each pedicle was then incised with the shears.  The anterior leaf was then incised at the reflection of the vessico-uterine junction and the lateral bladder retracted inferiorly after the round ligament had been divided with the PK forceps.  The  left tube was cauterized with the PK and divided with the shears;  then the left utero-ovarian ligament divided with the PK forceps and the scissors.  The round ligament was divided as well and the posterior leaf of the broad ligament then divided with the hot shears. The broad and cardinal ligaments were then cauterized on the left in the same way.   At the level of the internal os, the uterine arteries were bilaterally cauterized with the PK.  The ureters were identified well out of the field of dissection.  .   The bladder was then able  to be retracted inferiorly and the vesico-uterine fascia was incised in the midline until the bladder was removed one cm below the Koh ring.  The hot shears then circumferentially incised the vagina at the level of the reflection on the [facility] ring.  Once the uterus and cervix were amputated, cautery was used to insure hemostasis of the cuff.  Once hemostasis was achieved, the instruments were changed to the long forceps and mega suture cut needle driver and the cuff was closed with a running stitches of 0-vicryl V loc.  Cautery was used to ensure hemostasis of the left pedicles very superficially after a 2-0 vicryl suture was attempted to help hemostasis.  The ureters were peristalsing bilaterally well and very lateral to the areas of operation.    The Robot was then undocked and I scrubbed back in.    The skin incisions were closed with subcuticular stitches of 3-0 vicryl Rapide and Dermabond.  All instruments were removed from the vagina and cystoscopy performed, revealing an intact bladder and  spill of urine from each ureteral orifice.  The cystoscope was removed and the patient taken to the recovery room in stable condition.  Maria Velasquez A

## 2014-11-16 NOTE — Anesthesia Postprocedure Evaluation (Signed)
  Anesthesia Post-op Note  Patient: Maria Velasquez  Procedure(s) Performed: Procedure(s) with comments: ROBOTIC ASSISTED TOTAL HYSTERECTOMY (N/A) - TO FOLLOW FIRDT ROBOTIC CASE. tIME NEEDS TO BE REDUCED TO 3 HOURS. ROBOTIC ASSISTED BILATERAL SALPINGECTOMY (Bilateral)  Patient Location: PACU  Anesthesia Type:General  Level of Consciousness: awake, alert  and oriented  Airway and Oxygen Therapy: Patient Spontanous Breathing  Post-op Pain: mild  Post-op Assessment: Post-op Vital signs reviewed, Patient's Cardiovascular Status Stable, Respiratory Function Stable, Patent Airway, No signs of Nausea or vomiting and Pain level controlled  Post-op Vital Signs: Reviewed and stable  Last Vitals:  Filed Vitals:   11/16/14 1345  BP: 117/64  Pulse: 73  Temp:   Resp: 16    Complications: No apparent anesthesia complications

## 2014-11-16 NOTE — Anesthesia Procedure Notes (Signed)
Procedure Name: Intubation Date/Time: 11/16/2014 10:14 AM Performed by: Bobbye Charleston Pre-anesthesia Checklist: Patient identified, Emergency Drugs available, Suction available and Patient being monitored Patient Re-evaluated:Patient Re-evaluated prior to inductionOxygen Delivery Method: Circle system utilized Preoxygenation: Pre-oxygenation with 100% oxygen Intubation Type: IV induction Ventilation: Mask ventilation without difficulty Laryngoscope Size: Miller and 2 Grade View: Grade I Tube type: Oral Tube size: 7.0 mm Number of attempts: 1 Airway Equipment and Method: Stylet Secured at: 20 cm Tube secured with: Tape Dental Injury: Teeth and Oropharynx as per pre-operative assessment

## 2014-11-16 NOTE — Transfer of Care (Signed)
Immediate Anesthesia Transfer of Care Note  Patient: Maria Velasquez  Procedure(s) Performed: Procedure(s) with comments: ROBOTIC ASSISTED TOTAL HYSTERECTOMY (N/A) - TO FOLLOW FIRDT ROBOTIC CASE. tIME NEEDS TO BE REDUCED TO 3 HOURS. ROBOTIC ASSISTED BILATERAL SALPINGECTOMY (Bilateral)  Patient Location: PACU  Anesthesia Type:General  Level of Consciousness: awake, alert  and oriented  Airway & Oxygen Therapy: Patient Spontanous Breathing and Patient connected to nasal cannula oxygen  Post-op Assessment: Report given to RN and Post -op Vital signs reviewed and stable  Post vital signs: Reviewed and stable  Last Vitals:  Filed Vitals:   11/16/14 1208  BP: 119/67  Pulse: 84  Temp: 37 C  Resp: 16    Complications: No apparent anesthesia complications

## 2014-11-16 NOTE — Op Note (Signed)
11/16/2014  11:57 AM  PATIENT:  Maria Velasquez  39 y.o. female  PRE-OPERATIVE DIAGNOSIS:  FIBROIDS  POST-OPERATIVE DIAGNOSIS:  fibroids  PROCEDURE:  Procedure(s) with comments: ROBOTIC ASSISTED TOTAL HYSTERECTOMY (N/A) - TO FOLLOW FIRDT ROBOTIC CASE. tIME NEEDS TO BE REDUCED TO 3 HOURS. ROBOTIC ASSISTED BILATERAL SALPINGECTOMY (Bilateral)  SURGEON:  Surgeon(s) and Role:    * Bobbye Charleston, MD - Primary    * Allyn Kenner, DO - Assisting   ANESTHESIA:   general  EBL:  Total I/O In: 1000 [I.V.:1000] Out: 200 [Blood:200]  LOCAL MEDICATIONS USED:  OTHER Exparel  SPECIMEN:  Source of Specimen:  uterus, cervix, bilateral tubes  DISPOSITION OF SPECIMEN:  PATHOLOGY  COUNTS:  YES  TOURNIQUET:  * No tourniquets in log *  DICTATION: .Note written in EPIC  PLAN OF CARE: Admit for overnight observation  PATIENT DISPOSITION:  PACU - hemodynamically stable.   Delay start of Pharmacological VTE agent (>24hrs) due to surgical blood loss or risk of bleeding: not applicable  Complications:  None.  Findings:  8 weeks size uterus.  R ovary was normal; R tube had small clear plaques.  L ovary had a small cyst.  The ureters were identified during multiple points of the case and were always out of the field of dissection.  On cystoscopy, the bladder was intact and bilateral spill was seen from each ureteral orriface.    Medications:  Ancef.  Exparel.    Technique:  After adequate anesthesia was achieved the patient was positioned, prepped and draped in usual sterile fashion.  A speculum was placed in the vagina and the cervix dilated with pratt dilators.  The 8 cm Rumi and 3 cm Koh ring were assembled and placed in proper fashion.  The  Speculum was removed and the bladder catheterized with a foley.    Attention was turned to the abdomen where a 1 cm incision was made 1 cm above the umbilicus.  The veress needle was introduced without aspiration of bowel contents or blood and the  abdomen insufflated. The long 12 mm trocar was placed and the other three trocar sites were marked out, all approximately 10 cm from each other and the camera.  Two 8.5 mm trocars were placed on either side of the camera port and a 5 mm assistant port was placed 3 cm above the L iliac crest.  All trocars were inserted under direct visualization of the camera.  The patient was placed in trendelenburg and then the Robot docked.  The PK forceps were placed on arm 2 and the Hot shears on arm 1 and introduced under direct visualization of the camera.  I then broke scrub and sat down at the console.  The above findings were noted and the ureters identified well out of the field of dissection.  The ureters were identified and dissected and follwed to the uterine arteries. The right fallopian tube was isolated and cauterized with the PK.  The Utero-ovarian ligament was then divided with the PK cautery and shears.  The posterior broad ligament was then divided with the hot shears until the uterosacral ligament.  The Broad and cardinal ligaments were then cauterized against the cervix to the level of the Koh ring, securing the uterine artery.  Each pedicle was then incised with the shears.  The anterior leaf was then incised at the reflection of the vessico-uterine junction and the lateral bladder retracted inferiorly after the round ligament had been divided with the PK forceps.  The  left tube was cauterized with the PK and divided with the shears;  then the left utero-ovarian ligament divided with the PK forceps and the scissors.  The round ligament was divided as well and the posterior leaf of the broad ligament then divided with the hot shears. The broad and cardinal ligaments were then cauterized on the left in the same way.   At the level of the internal os, the uterine arteries were bilaterally cauterized with the PK.  The ureters were identified well out of the field of dissection.  .   The bladder was then able  to be retracted inferiorly and the vesico-uterine fascia was incised in the midline until the bladder was removed one cm below the Koh ring.  The hot shears then circumferentially incised the vagina at the level of the reflection on the [facility] ring.  Once the uterus and cervix were amputated, cautery was used to insure hemostasis of the cuff.  Once hemostasis was achieved, the instruments were changed to the long forceps and mega suture cut needle driver and the cuff was closed with a running stitches of 0-vicryl V loc.  Cautery was used to ensure hemostasis of the left pedicles very superficially after a 2-0 vicryl suture was attempted to help hemostasis.  The ureters were peristalsing bilaterally well and very lateral to the areas of operation.    The Robot was then undocked and I scrubbed back in.    The skin incisions were closed with subcuticular stitches of 3-0 vicryl Rapide and Dermabond.  All instruments were removed from the vagina and cystoscopy performed, revealing an intact bladder and  spill of urine from each ureteral orifice.  The cystoscope was removed and the patient taken to the recovery room in stable condition.  Danny Zimny A

## 2014-11-16 NOTE — Anesthesia Preprocedure Evaluation (Addendum)
Anesthesia Evaluation  Patient identified by MRN, date of birth, ID band Patient awake    Reviewed: Allergy & Precautions, NPO status , Patient's Chart, lab work & pertinent test results  Airway Mallampati: II  TM Distance: >3 FB Neck ROM: Full    Dental no notable dental hx. (+) Teeth Intact   Pulmonary neg pulmonary ROS,  breath sounds clear to auscultation  Pulmonary exam normal       Cardiovascular negative cardio ROS Normal cardiovascular examRhythm:Regular Rate:Normal     Neuro/Psych  Headaches,    GI/Hepatic negative GI ROS, Neg liver ROS,   Endo/Other  negative endocrine ROS  Renal/GU negative Renal ROS  negative genitourinary   Musculoskeletal negative musculoskeletal ROS (+)   Abdominal   Peds  Hematology negative hematology ROS (+)   Anesthesia Other Findings   Reproductive/Obstetrics Symptomatic Uterine fibroids DUB                            Anesthesia Physical Anesthesia Plan  ASA: II  Anesthesia Plan:    Post-op Pain Management:    Induction: Intravenous  Airway Management Planned: Oral ETT  Additional Equipment:   Intra-op Plan:   Post-operative Plan: Extubation in OR  Informed Consent: I have reviewed the patients History and Physical, chart, labs and discussed the procedure including the risks, benefits and alternatives for the proposed anesthesia with the patient or authorized representative who has indicated his/her understanding and acceptance.   Dental advisory given  Plan Discussed with: CRNA, Anesthesiologist and Surgeon  Anesthesia Plan Comments:         Anesthesia Quick Evaluation

## 2014-11-17 ENCOUNTER — Encounter (HOSPITAL_COMMUNITY): Payer: Self-pay | Admitting: Obstetrics and Gynecology

## 2014-11-17 DIAGNOSIS — D259 Leiomyoma of uterus, unspecified: Secondary | ICD-10-CM | POA: Diagnosis not present

## 2014-11-17 LAB — COMPREHENSIVE METABOLIC PANEL
ALK PHOS: 53 U/L (ref 38–126)
ALT: 11 U/L — ABNORMAL LOW (ref 14–54)
AST: 18 U/L (ref 15–41)
Albumin: 3.2 g/dL — ABNORMAL LOW (ref 3.5–5.0)
Anion gap: 5 (ref 5–15)
BILIRUBIN TOTAL: 0.9 mg/dL (ref 0.3–1.2)
BUN: 6 mg/dL (ref 6–20)
CO2: 26 mmol/L (ref 22–32)
CREATININE: 0.74 mg/dL (ref 0.44–1.00)
Calcium: 8.2 mg/dL — ABNORMAL LOW (ref 8.9–10.3)
Chloride: 107 mmol/L (ref 101–111)
GFR calc Af Amer: >60 mL/min (ref >60)
GFR calc non Af Amer: >60 mL/min (ref >60)
Glucose, Bld: 117 mg/dL — ABNORMAL HIGH (ref 65–99)
Potassium: 3.5 mmol/L (ref 3.5–5.1)
Sodium: 138 mmol/L (ref 135–145)
Total Protein: 5.5 g/dL — ABNORMAL LOW (ref 6.5–8.1)

## 2014-11-17 MED ORDER — HYDROCODONE-ACETAMINOPHEN 5-325 MG PO TABS: 1.0000 | ORAL_TABLET | ORAL | Status: DC | PRN

## 2014-11-17 NOTE — Discharge Summary (Signed)
Physician Discharge Summary  Patient ID: Maria Velasquez MRN: [id number] DOB/AGE: Nov 12, 1975 39 y.o.  Admit date: 11/16/2014 Discharge date: 11/17/2014  Admission Diagnoses:symptomatic fibroid uterus  Discharge Diagnoses: same Active Problems:   Postoperative state   Discharged Condition: good  Hospital Course: Uncomplicated surgery and post op.  Consults: None  Significant Diagnostic Studies: labs:  Results for orders placed or performed during the hospital encounter of 11/16/14 (from the past 24 hour(s))  Pregnancy, urine     Status: None   Collection Time: 11/16/14  9:14 AM  Result Value Ref Range   Preg Test, Ur NEGATIVE NEGATIVE  Comprehensive metabolic panel     Status: Abnormal   Collection Time: 11/17/14  5:24 AM  Result Value Ref Range   Sodium 138 135 - 145 mmol/L   Potassium 3.5 3.5 - 5.1 mmol/L   Chloride 107 101 - 111 mmol/L   CO2 26 22 - 32 mmol/L   Glucose, Bld 117 (H) 65 - 99 mg/dL   BUN 6 6 - 20 mg/dL   Creatinine, Ser 4.09 0.44 - 1.00 mg/dL   Calcium 8.2 (L) 8.9 - 10.3 mg/dL   Total Protein 5.5 (L) 6.5 - 8.1 g/dL   Albumin 3.2 (L) 3.5 - 5.0 g/dL   AST 18 15 - 41 U/L   ALT 11 (L) 14 - 54 U/L   Alkaline Phosphatase 53 38 - 126 U/L   Total Bilirubin 0.9 0.3 - 1.2 mg/dL   GFR calc non Af Amer >60 >60 mL/min   GFR calc Af Amer >60 >60 mL/min   Anion gap 5 5 - 15    Treatments: surgery: Robotic TLH/B salpingectomies/cysto  Discharge Exam: Blood pressure 112/65, pulse 58, temperature 99.4 F (37.4 C), temperature source Oral, resp. rate 18, height 5' 2.5" (1.588 m), weight 74.844 kg (165 lb), last menstrual period 10/19/2014, SpO2 98 %.   Disposition: 01-Home or Self Care  Discharge Instructions    Call MD for:  temperature >100.4    Complete by:  As directed      Diet - low sodium heart healthy    Complete by:  As directed      Discharge instructions    Complete by:  As directed   No driving on narcotics, no sexual activity for 2 weeks.     Increase activity slowly    Complete by:  As directed      May shower / Bathe    Complete by:  As directed   Shower, no bath for 2 weeks.     Remove dressing in 24 hours    Complete by:  As directed      Sexual Activity Restrictions    Complete by:  As directed   No sexual activity for 2 weeks.            Medication List    TAKE these medications        HYDROcodone-acetaminophen 5-325 MG per tablet  Commonly known as:  NORCO/VICODIN  Take 1-2 tablets by mouth every 4 (four) hours as needed for severe pain.     ibuprofen 200 MG tablet  Commonly known as:  ADVIL,MOTRIN  Take 200-800 mg by mouth 2 (two) times daily as needed for headache or mild pain.     phentermine 15 MG capsule  Take 15 mg by mouth every morning.           Follow-up Information    Follow up with Rogerio Boutelle A, MD In 2 weeks.  Specialty:  Obstetrics and Gynecology   Contact information:   [address] [place] [address] [phone]       Signed: Alyviah Crandle A 11/17/2014, 6:59 AM

## 2014-11-17 NOTE — Progress Notes (Signed)
Patient is eating, ambulating, and voiding.  Pain control is good.  BP 112/65 mmHg  Pulse 58  Temp(Src) 99.4 F (37.4 C) (Oral)  Resp 18  Ht 5' 2.5" (1.588 m)  Wt 74.844 kg (165 lb)  BMI 29.68 kg/m2  SpO2 98%  LMP 10/19/2014  lungs:   clear to auscultation cor:    RRR Abdomen:  soft, appropriate tenderness, incisions intact and without erythema or exudate. ex:    no cords   Results for orders placed or performed during the hospital encounter of 11/16/14 (from the past 24 hour(s))  Pregnancy, urine     Status: None   Collection Time: 11/16/14  9:14 AM  Result Value Ref Range   Preg Test, Ur NEGATIVE NEGATIVE  Comprehensive metabolic panel     Status: Abnormal   Collection Time: 11/17/14  5:24 AM  Result Value Ref Range   Sodium 138 135 - 145 mmol/L   Potassium 3.5 3.5 - 5.1 mmol/L   Chloride 107 101 - 111 mmol/L   CO2 26 22 - 32 mmol/L   Glucose, Bld 117 (H) 65 - 99 mg/dL   BUN 6 6 - 20 mg/dL   Creatinine, Ser 0.74 0.44 - 1.00 mg/dL   Calcium 8.2 (L) 8.9 - 10.3 mg/dL   Total Protein 5.5 (L) 6.5 - 8.1 g/dL   Albumin 3.2 (L) 3.5 - 5.0 g/dL   AST 18 15 - 41 U/L   ALT 11 (L) 14 - 54 U/L   Alkaline Phosphatase 53 38 - 126 U/L   Total Bilirubin 0.9 0.3 - 1.2 mg/dL   GFR calc non Af Amer >60 >60 mL/min   GFR calc Af Amer >60 >60 mL/min   Anion gap 5 5 - 15    A/P  Routine care.  Expect d/c per plan.

## 2014-11-17 NOTE — Progress Notes (Signed)
Pt discharged to home with fiancee.  Condition stable.  Pt ambulated to car with RN.  No equipment for home ordered at discharge.

## 2015-03-27 ENCOUNTER — Other Ambulatory Visit: Payer: Self-pay

## 2015-03-27 DIAGNOSIS — Z1231 Encounter for screening mammogram for malignant neoplasm of breast: Secondary | ICD-10-CM

## 2015-04-04 ENCOUNTER — Ambulatory Visit
Admission: RE | Admit: 2015-04-04 | Discharge: 2015-04-04 | Disposition: A | Discharge status: Home / Self Care | Payer: No Typology Code available for payment source | Source: Ambulatory Visit

## 2015-04-04 DIAGNOSIS — Z1231 Encounter for screening mammogram for malignant neoplasm of breast: Secondary | ICD-10-CM

## 2016-02-29 ENCOUNTER — Other Ambulatory Visit: Payer: Self-pay | Admitting: Obstetrics and Gynecology

## 2016-02-29 DIAGNOSIS — Z1231 Encounter for screening mammogram for malignant neoplasm of breast: Secondary | ICD-10-CM

## 2016-04-12 ENCOUNTER — Ambulatory Visit
Admission: RE | Admit: 2016-04-12 | Discharge: 2016-04-12 | Disposition: A | Discharge status: Home / Self Care | Payer: Federal, State, Local not specified - PPO | Source: Ambulatory Visit | Attending: Obstetrics and Gynecology | Admitting: Obstetrics and Gynecology

## 2016-04-12 ENCOUNTER — Ambulatory Visit: Payer: Self-pay

## 2016-04-12 DIAGNOSIS — Z1231 Encounter for screening mammogram for malignant neoplasm of breast: Secondary | ICD-10-CM

## 2017-03-21 ENCOUNTER — Other Ambulatory Visit: Payer: Self-pay | Admitting: Obstetrics and Gynecology

## 2017-03-21 DIAGNOSIS — Z1231 Encounter for screening mammogram for malignant neoplasm of breast: Secondary | ICD-10-CM

## 2017-04-14 ENCOUNTER — Ambulatory Visit: Payer: Federal, State, Local not specified - PPO

## 2017-05-05 ENCOUNTER — Ambulatory Visit
Admission: RE | Admit: 2017-05-05 | Discharge: 2017-05-05 | Disposition: A | Discharge status: Home / Self Care | Payer: Federal, State, Local not specified - PPO | Source: Ambulatory Visit | Attending: Obstetrics and Gynecology | Admitting: Obstetrics and Gynecology

## 2017-05-05 DIAGNOSIS — Z1231 Encounter for screening mammogram for malignant neoplasm of breast: Secondary | ICD-10-CM

## 2017-12-29 ENCOUNTER — Other Ambulatory Visit: Payer: Self-pay

## 2017-12-29 ENCOUNTER — Emergency Department (HOSPITAL_COMMUNITY): Payer: Federal, State, Local not specified - PPO

## 2017-12-29 ENCOUNTER — Encounter (HOSPITAL_COMMUNITY): Payer: Self-pay | Admitting: *Deleted

## 2017-12-29 ENCOUNTER — Emergency Department (HOSPITAL_COMMUNITY)
Admission: EM | Admit: 2017-12-29 | Discharge: 2017-12-29 | Disposition: A | Discharge status: Home / Self Care | Payer: Federal, State, Local not specified - PPO | Attending: Emergency Medicine | Admitting: Emergency Medicine

## 2017-12-29 DIAGNOSIS — K5901 Slow transit constipation: Secondary | ICD-10-CM

## 2017-12-29 DIAGNOSIS — Z79899 Other long term (current) drug therapy: Secondary | ICD-10-CM | POA: Diagnosis not present

## 2017-12-29 DIAGNOSIS — R1084 Generalized abdominal pain: Secondary | ICD-10-CM | POA: Insufficient documentation

## 2017-12-29 LAB — COMPREHENSIVE METABOLIC PANEL
ALT: 23 U/L (ref 0–44)
AST: 22 U/L (ref 15–41)
Albumin: 4 g/dL (ref 3.5–5.0)
Alkaline Phosphatase: 74 U/L (ref 38–126)
Anion gap: 6 (ref 5–15)
BUN: 16 mg/dL (ref 6–20)
CALCIUM: 8.7 mg/dL — AB (ref 8.9–10.3)
CO2: 23 mmol/L (ref 22–32)
CREATININE: 0.73 mg/dL (ref 0.44–1.00)
Chloride: 109 mmol/L (ref 98–111)
GFR calc Af Amer: >60 mL/min (ref >60)
Glucose, Bld: 103 mg/dL — ABNORMAL HIGH (ref 70–99)
POTASSIUM: 3.8 mmol/L (ref 3.5–5.1)
Sodium: 138 mmol/L (ref 135–145)
TOTAL PROTEIN: 7.1 g/dL (ref 6.5–8.1)
Total Bilirubin: 0.7 mg/dL (ref 0.3–1.2)

## 2017-12-29 LAB — URINALYSIS, ROUTINE W REFLEX MICROSCOPIC
Bacteria, UA: NONE SEEN
Bilirubin Urine: NEGATIVE
GLUCOSE, UA: NEGATIVE mg/dL
Hgb urine dipstick: NEGATIVE
Ketones, ur: 5 mg/dL — AB
NITRITE: NEGATIVE
Protein, ur: NEGATIVE mg/dL
Specific Gravity, Urine: 1.018 (ref 1.005–1.030)
pH: 5 (ref 5.0–8.0)

## 2017-12-29 LAB — CBC
HEMATOCRIT: 42.5 % (ref 36.0–46.0)
Hemoglobin: 14.8 g/dL (ref 12.0–15.0)
MCH: 31.3 pg (ref 26.0–34.0)
MCHC: 34.8 g/dL (ref 30.0–36.0)
MCV: 89.9 fL (ref 78.0–100.0)
PLATELETS: 272 10*3/uL (ref 150–400)
RBC: 4.73 MIL/uL (ref 3.87–5.11)
RDW: 12.6 % (ref 11.5–15.5)
WBC: 11 10*3/uL — AB (ref 4.0–10.5)

## 2017-12-29 LAB — LIPASE, BLOOD: Lipase: 34 U/L (ref 11–51)

## 2017-12-29 MED ORDER — ONDANSETRON HCL 4 MG/2ML IJ SOLN
4.0000 mg | Freq: Once | INTRAMUSCULAR | Status: AC
  Administered 2017-12-29: 4 mg via INTRAVENOUS
  Filled 2017-12-29: qty 2

## 2017-12-29 MED ORDER — PEG 3350-KCL-NABCB-NACL-NASULF 236 G PO SOLR: ORAL | 0 refills | Status: DC

## 2017-12-29 MED ORDER — MORPHINE SULFATE (PF) 4 MG/ML IV SOLN
4.0000 mg | Freq: Once | INTRAVENOUS | Status: DC
  Filled 2017-12-29: qty 1

## 2017-12-29 MED ORDER — IOPAMIDOL (ISOVUE-300) INJECTION 61%
100.0000 mL | Freq: Once | INTRAVENOUS | Status: AC | PRN
  Administered 2017-12-29: 100 mL via INTRAVENOUS

## 2017-12-29 NOTE — ED Triage Notes (Signed)
Pt c/o right lower abdominal pain that started at 2030 earlier this evening; pt states she feels nauseous but no vomiting and states she has difficulty with urinating and bowel movements

## 2017-12-29 NOTE — ED Provider Notes (Signed)
[facility] EMERGENCY DEPARTMENT Provider Note   CSN: [id number] Arrival date & time: 12/29/17  0207     History   Chief Complaint Chief Complaint  Patient presents with  . Abdominal Pain    HPI Maria Velasquez is a 42 y.o. female.  Patient presents to the emergency department for evaluation of abdominal pain.  Patient reports that her pain began around 830 tonight.  She first had pain in the upper abdomen area but now it is more in the right lower abdomen.  She has had nausea but no vomiting.  She reports that she did have a fever at home but took Tylenol before coming to the ER.     Past Medical History:  Diagnosis Date  . Headache    otc med prn  . SVD (spontaneous vaginal delivery)    x 1    Patient Active Problem List   Diagnosis Date Noted  . Postoperative state 11/16/2014    Past Surgical History:  Procedure Laterality Date  . BACK SURGERY  01/30/2007   L4-L5, L5-S1 herniated disk  . DILATION AND CURETTAGE OF UTERUS  08/05/2009   missed abortion  . ROBOTIC ASSISTED BILATERAL SALPINGO OOPHERECTOMY Bilateral 11/16/2014   Procedure: ROBOTIC ASSISTED BILATERAL SALPINGECTOMY;  Surgeon: Bobbye Charleston, MD;  Location: [facility];  Service: Gynecology;  Laterality: Bilateral;  . ROBOTIC ASSISTED TOTAL HYSTERECTOMY N/A 11/16/2014   Procedure: ROBOTIC ASSISTED TOTAL HYSTERECTOMY;  Surgeon: Bobbye Charleston, MD;  Location: [facility];  Service: Gynecology;  Laterality: N/A;  TO FOLLOW FIRDT ROBOTIC CASE. tIME NEEDS TO BE REDUCED TO 3 HOURS.  . TUBAL LIGATION  03/20/2010   filshie clips  . WISDOM TOOTH EXTRACTION       OB History   None      Home Medications    Prior to Admission medications   Medication Sig Start Date End Date Taking? Authorizing Provider  HYDROcodone-acetaminophen (NORCO/VICODIN) 5-325 MG per tablet Take 1-2 tablets by mouth every 4 (four) hours as needed for severe pain. 11/17/14   Bobbye Charleston, MD  ibuprofen (ADVIL,MOTRIN) 200 MG tablet Take  200-800 mg by mouth 2 (two) times daily as needed for headache or mild pain.    [provider]  phentermine 15 MG capsule Take 15 mg by mouth every morning.    [provider]  polyethylene glycol (GOLYTELY) 236 g solution Drink 8 ounces at a time, every 1-2 hours until you have a bowel movement 12/29/17   Draeden Kellman, Gwenyth Allegra, MD    Family History Family History  Problem Relation Age of Onset  . Breast cancer Mother     Social History Social History   Tobacco Use  . Smoking status: Never Smoker  . Smokeless tobacco: Never Used  Substance Use Topics  . Alcohol use: Yes    Comment: socially  . Drug use: No     Allergies   Oxycodone   Review of Systems Review of Systems  Gastrointestinal: Positive for abdominal pain and nausea.  All other systems reviewed and are negative.    Physical Exam Updated Vital Signs BP 130/84   Pulse 86   Temp 98 F (36.7 C) (Oral)   Resp 17   Ht 5\' 3"  (1.6 m)   Wt 78 kg (172 lb)   LMP 10/19/2014 (Approximate)   SpO2 99%   BMI 30.47 kg/m   Physical Exam  Constitutional: She is oriented to person, place, and time. She appears well-developed and well-nourished. No distress.  HENT:  Head:  Normocephalic and atraumatic.  Right Ear: Hearing normal.  Left Ear: Hearing normal.  Nose: Nose normal.  Mouth/Throat: Oropharynx is clear and moist and mucous membranes are normal.  Eyes: Pupils are equal, round, and reactive to light. Conjunctivae and EOM are normal.  Neck: Normal range of motion. Neck supple.  Cardiovascular: Regular rhythm, S1 normal and S2 normal. Exam reveals no gallop and no friction rub.  No murmur heard. Pulmonary/Chest: Effort normal and breath sounds normal. No respiratory distress. She exhibits no tenderness.  Abdominal: Soft. Normal appearance and bowel sounds are normal. There is no hepatosplenomegaly. There is tenderness (Right-sided tenderness, right lower much greater than right upper) in the  right upper quadrant and right lower quadrant. There is no rebound, no guarding, no tenderness at McBurney's point and negative Murphy's sign. No hernia.  Musculoskeletal: Normal range of motion.  Neurological: She is alert and oriented to person, place, and time. She has normal strength. No cranial nerve deficit or sensory deficit. Coordination normal. GCS eye subscore is 4. GCS verbal subscore is 5. GCS motor subscore is 6.  Skin: Skin is warm, dry and intact. No rash noted. No cyanosis.  Psychiatric: She has a normal mood and affect. Her speech is normal and behavior is normal. Thought content normal.  Nursing note and vitals reviewed.    ED Treatments / Results  Labs (all labs ordered are listed, but only abnormal results are displayed) Labs Reviewed  COMPREHENSIVE METABOLIC PANEL - Abnormal; Notable for the following components:      Result Value   Glucose, Bld 103 (*)    Calcium 8.7 (*)    All other components within normal limits  CBC - Abnormal; Notable for the following components:   WBC 11.0 (*)    All other components within normal limits  URINALYSIS, ROUTINE W REFLEX MICROSCOPIC - Abnormal; Notable for the following components:   Ketones, ur 5 (*)    Leukocytes, UA TRACE (*)    All other components within normal limits  LIPASE, BLOOD    EKG None  Radiology Ct Abdomen Pelvis W Contrast  Result Date: 12/29/2017 CLINICAL DATA:  Right-sided abdominal pain.  Nausea. EXAM: CT ABDOMEN AND PELVIS WITH CONTRAST TECHNIQUE: Multidetector CT imaging of the abdomen and pelvis was performed using the standard protocol following bolus administration of intravenous contrast. CONTRAST:  157mL ISOVUE-300 IOPAMIDOL (ISOVUE-300) INJECTION 61% COMPARISON:  None. FINDINGS: Lower chest: Linear atelectasis in both lower lobes. No confluent consolidation. Heart is normal in size. Hepatobiliary: Innumerable subcentimeter hypodensities scattered throughout the liver may be small cysts or biliary  hamartomas. Gallbladder partially distended. No calcified gallstone. No biliary dilatation. Pancreas: No ductal dilatation or inflammation. Spleen: Normal in size without focal abnormality. Adrenals/Urinary Tract: No adrenal nodule. No hydronephrosis or perinephric edema. Homogeneous renal enhancement with symmetric excretion on delayed phase imaging. Urinary bladder is nondistended and not well evaluated. Stomach/Bowel: Normal appendix, for example coronal image 59, axial image 68. No bowel wall thickening, inflammatory change or obstruction. Fecalization of distal small bowel contents. Moderate diffuse colonic stool burden. No colonic inflammatory change. Vascular/Lymphatic: No significant vascular findings are present. No enlarged abdominal or pelvic lymph nodes. Reproductive: Post hysterectomy. Small peripherally enhancing cyst in the right ovary consistent with corpus luteum. Left ovary is normal. Other: No free air, free fluid, or intra-abdominal fluid collection. Tiny fat containing umbilical hernia. Musculoskeletal: There are no acute or suspicious osseous abnormalities. IMPRESSION: 1. Moderate colonic stool burden with fecalization of small bowel contents suggesting constipation and slow  transit. No obstruction or bowel inflammation. Normal appendix. 2. Multiple subcentimeter low-density lesions throughout the liver may represent small cysts or biliary hamartomas. Electronically Signed   By: Jeb Levering M.D.   On: 12/29/2017 05:23    Procedures Procedures (including critical care time)  Medications Ordered in ED Medications  morphine 4 MG/ML injection 4 mg (4 mg Intravenous Refused 12/29/17 0345)  ondansetron (ZOFRAN) injection 4 mg (4 mg Intravenous Given 12/29/17 0319)  iopamidol (ISOVUE-300) 61 % injection 100 mL (100 mLs Intravenous Contrast Given 12/29/17 0419)     Initial Impression / Assessment and Plan / ED Course  I have reviewed the triage vital signs and the nursing  notes.  Pertinent labs & imaging results that were available during my care of the patient were reviewed by me and considered in my medical decision making (see chart for details).     Presents to the ER for evaluation of abdominal pain.  Patient just got back from a beach vacation.  She reports that she had not had a normal bowel movement during the stay at the beach.  She just started having severe pain this evening which brought her to the ER.  She did have diffuse sided tenderness but she was most tender in the right lower quadrant.  She therefore underwent CT scan.  CT scan does not show any evidence of appendicitis.  She does have significant stool burden including fecal is Asian of her small intestine which explains her current symptoms.  This does fit clinically as well.  Patient will be given a prescription for GoLYTELY bowel prep be used today.  Final Clinical Impressions(s) / ED Diagnoses   Final diagnoses:  Generalized abdominal pain  Slow transit constipation    ED Discharge Orders        Ordered    polyethylene glycol (GOLYTELY) 236 g solution     12/29/17 0546       Orpah Greek, MD 12/29/17 [phone]

## 2017-12-29 NOTE — ED Notes (Signed)
Pt ambulatory to waiting room. Pt verbalized understanding of discharge instructions.   

## 2018-03-30 ENCOUNTER — Other Ambulatory Visit: Payer: Self-pay | Admitting: Obstetrics and Gynecology

## 2018-03-30 DIAGNOSIS — Z1231 Encounter for screening mammogram for malignant neoplasm of breast: Secondary | ICD-10-CM

## 2018-05-11 ENCOUNTER — Ambulatory Visit
Admission: RE | Admit: 2018-05-11 | Discharge: 2018-05-11 | Disposition: A | Discharge status: Home / Self Care | Payer: Federal, State, Local not specified - PPO | Source: Ambulatory Visit | Attending: Obstetrics and Gynecology | Admitting: Obstetrics and Gynecology

## 2018-05-11 DIAGNOSIS — Z1231 Encounter for screening mammogram for malignant neoplasm of breast: Secondary | ICD-10-CM

## 2018-08-23 DIAGNOSIS — N83519 Torsion of ovary and ovarian pedicle, unspecified side: Secondary | ICD-10-CM

## 2018-08-23 HISTORY — DX: Torsion of ovary and ovarian pedicle, unspecified side: N83.519

## 2018-08-28 ENCOUNTER — Observation Stay (HOSPITAL_COMMUNITY)
Admission: EM | Admit: 2018-08-28 | Discharge: 2018-08-28 | Disposition: A | Discharge status: Home / Self Care | Payer: Commercial Managed Care - PPO | Attending: Obstetrics and Gynecology | Admitting: Obstetrics and Gynecology

## 2018-08-28 ENCOUNTER — Ambulatory Visit: Admit: 2018-08-28 | Payer: Commercial Managed Care - PPO | Admitting: Obstetrics and Gynecology

## 2018-08-28 ENCOUNTER — Emergency Department (HOSPITAL_COMMUNITY): Payer: Commercial Managed Care - PPO

## 2018-08-28 ENCOUNTER — Emergency Department (HOSPITAL_COMMUNITY): Payer: Commercial Managed Care - PPO | Admitting: Anesthesiology

## 2018-08-28 ENCOUNTER — Encounter (HOSPITAL_COMMUNITY): Payer: Self-pay

## 2018-08-28 ENCOUNTER — Encounter (HOSPITAL_COMMUNITY)
Admission: EM | Disposition: A | Discharge status: Home / Self Care | Payer: Self-pay | Source: Home / Self Care | Attending: Emergency Medicine

## 2018-08-28 DIAGNOSIS — N83209 Unspecified ovarian cyst, unspecified side: Secondary | ICD-10-CM

## 2018-08-28 DIAGNOSIS — R102 Pelvic and perineal pain unspecified side: Secondary | ICD-10-CM

## 2018-08-28 DIAGNOSIS — N83519 Torsion of ovary and ovarian pedicle, unspecified side: Secondary | ICD-10-CM

## 2018-08-28 DIAGNOSIS — Z90710 Acquired absence of both cervix and uterus: Secondary | ICD-10-CM | POA: Insufficient documentation

## 2018-08-28 DIAGNOSIS — N83511 Torsion of right ovary and ovarian pedicle: Principal | ICD-10-CM | POA: Insufficient documentation

## 2018-08-28 DIAGNOSIS — N8311 Corpus luteum cyst of right ovary: Secondary | ICD-10-CM | POA: Insufficient documentation

## 2018-08-28 DIAGNOSIS — R109 Unspecified abdominal pain: Secondary | ICD-10-CM | POA: Diagnosis present

## 2018-08-28 HISTORY — DX: Unspecified ovarian cyst, unspecified side: N83.209

## 2018-08-28 LAB — CBC WITH DIFFERENTIAL/PLATELET
Abs Immature Granulocytes: 0.06 10*3/uL (ref 0.00–0.07)
BASOS PCT: 0 %
Basophils Absolute: 0 10*3/uL (ref 0.0–0.1)
EOS PCT: 1 %
Eosinophils Absolute: 0.1 10*3/uL (ref 0.0–0.5)
HCT: 43.8 % (ref 36.0–46.0)
Hemoglobin: 14.5 g/dL (ref 12.0–15.0)
Immature Granulocytes: 0 %
Lymphocytes Relative: 17 %
Lymphs Abs: 2.9 10*3/uL (ref 0.7–4.0)
MCH: 29.4 pg (ref 26.0–34.0)
MCHC: 33.1 g/dL (ref 30.0–36.0)
MCV: 88.7 fL (ref 80.0–100.0)
Monocytes Absolute: 0.8 10*3/uL (ref 0.1–1.0)
Monocytes Relative: 4 %
NEUTROS ABS: 13.5 10*3/uL — AB (ref 1.7–7.7)
NRBC: 0 % (ref 0.0–0.2)
Neutrophils Relative %: 78 %
PLATELETS: 321 10*3/uL (ref 150–400)
RBC: 4.94 MIL/uL (ref 3.87–5.11)
RDW: 12.6 % (ref 11.5–15.5)
WBC: 17.3 10*3/uL — AB (ref 4.0–10.5)

## 2018-08-28 LAB — COMPREHENSIVE METABOLIC PANEL
ALT: 14 U/L (ref 0–44)
ANION GAP: 9 (ref 5–15)
AST: 18 U/L (ref 15–41)
Albumin: 4.2 g/dL (ref 3.5–5.0)
Alkaline Phosphatase: 78 U/L (ref 38–126)
BUN: 11 mg/dL (ref 6–20)
CHLORIDE: 105 mmol/L (ref 98–111)
CO2: 22 mmol/L (ref 22–32)
Calcium: 9.4 mg/dL (ref 8.9–10.3)
Creatinine, Ser: 0.8 mg/dL (ref 0.44–1.00)
GFR calc non Af Amer: >60 mL/min (ref >60)
Glucose, Bld: 118 mg/dL — ABNORMAL HIGH (ref 70–99)
POTASSIUM: 3.3 mmol/L — AB (ref 3.5–5.1)
SODIUM: 136 mmol/L (ref 135–145)
Total Bilirubin: 0.6 mg/dL (ref 0.3–1.2)
Total Protein: 7.1 g/dL (ref 6.5–8.1)

## 2018-08-28 LAB — URINALYSIS, ROUTINE W REFLEX MICROSCOPIC
BILIRUBIN URINE: NEGATIVE
Glucose, UA: NEGATIVE mg/dL
Hgb urine dipstick: NEGATIVE
KETONES UR: NEGATIVE mg/dL
LEUKOCYTE UA: NEGATIVE
NITRITE: NEGATIVE
Protein, ur: NEGATIVE mg/dL
SPECIFIC GRAVITY, URINE: 1.009 (ref 1.005–1.030)
pH: 6 (ref 5.0–8.0)

## 2018-08-28 LAB — PREGNANCY, URINE: PREG TEST UR: NEGATIVE

## 2018-08-28 LAB — TYPE AND SCREEN
ABO/RH(D): B POS
ANTIBODY SCREEN: NEGATIVE

## 2018-08-28 LAB — LIPASE, BLOOD: Lipase: 29 U/L (ref 11–51)

## 2018-08-28 SURGERY — OOPHORECTOMY, LAPAROSCOPIC
Anesthesia: General | Site: Abdomen | Laterality: Right

## 2018-08-28 MED ORDER — MIDAZOLAM HCL 5 MG/5ML IJ SOLN
INTRAMUSCULAR | Status: DC | PRN
  Administered 2018-08-28: 2 mg via INTRAVENOUS

## 2018-08-28 MED ORDER — CEFAZOLIN SODIUM-DEXTROSE 2-4 GM/100ML-% IV SOLN
2.0000 g | INTRAVENOUS | Status: AC
  Administered 2018-08-28: 2 g via INTRAVENOUS

## 2018-08-28 MED ORDER — SUGAMMADEX SODIUM 200 MG/2ML IV SOLN
INTRAVENOUS | Status: AC
  Filled 2018-08-28: qty 2

## 2018-08-28 MED ORDER — HYDROCODONE-ACETAMINOPHEN 5-325 MG PO TABS: 1.0000 | ORAL_TABLET | Freq: Four times a day (QID) | ORAL | 0 refills | Status: DC | PRN

## 2018-08-28 MED ORDER — EPHEDRINE 5 MG/ML INJ
INTRAVENOUS | Status: AC
  Filled 2018-08-28: qty 20

## 2018-08-28 MED ORDER — FENTANYL CITRATE (PF) 100 MCG/2ML IJ SOLN
50.0000 ug | Freq: Once | INTRAMUSCULAR | Status: AC
  Administered 2018-08-28: 50 ug via INTRAVENOUS
  Filled 2018-08-28: qty 2

## 2018-08-28 MED ORDER — BUPIVACAINE HCL (PF) 0.5 % IJ SOLN
INTRAMUSCULAR | Status: AC
  Filled 2018-08-28: qty 30

## 2018-08-28 MED ORDER — ROCURONIUM BROMIDE 10 MG/ML (PF) SYRINGE
PREFILLED_SYRINGE | INTRAVENOUS | Status: AC
  Filled 2018-08-28: qty 10

## 2018-08-28 MED ORDER — PROMETHAZINE HCL 25 MG/ML IJ SOLN: 6.2500 mg | INTRAMUSCULAR | Status: DC | PRN

## 2018-08-28 MED ORDER — ONDANSETRON HCL 4 MG/2ML IJ SOLN
INTRAMUSCULAR | Status: AC
  Filled 2018-08-28: qty 2

## 2018-08-28 MED ORDER — MIDAZOLAM HCL 2 MG/2ML IJ SOLN
INTRAMUSCULAR | Status: AC
  Filled 2018-08-28: qty 2

## 2018-08-28 MED ORDER — IOHEXOL 300 MG/ML  SOLN
100.0000 mL | Freq: Once | INTRAMUSCULAR | Status: AC | PRN
  Administered 2018-08-28: 100 mL via INTRAVENOUS

## 2018-08-28 MED ORDER — KETOROLAC TROMETHAMINE 30 MG/ML IJ SOLN
INTRAMUSCULAR | Status: AC
  Filled 2018-08-28: qty 1

## 2018-08-28 MED ORDER — HYDROMORPHONE HCL 1 MG/ML IJ SOLN
1.0000 mg | Freq: Once | INTRAMUSCULAR | Status: AC
  Administered 2018-08-28: 1 mg via INTRAVENOUS
  Filled 2018-08-28: qty 1

## 2018-08-28 MED ORDER — HYDROCODONE-ACETAMINOPHEN 5-325 MG PO TABS
ORAL_TABLET | ORAL | Status: AC
  Filled 2018-08-28: qty 2

## 2018-08-28 MED ORDER — BUPIVACAINE HCL (PF) 0.5 % IJ SOLN
INTRAMUSCULAR | Status: DC | PRN
  Administered 2018-08-28: 16 mL

## 2018-08-28 MED ORDER — SUGAMMADEX SODIUM 200 MG/2ML IV SOLN
INTRAVENOUS | Status: DC | PRN
  Administered 2018-08-28: 200 mg via INTRAVENOUS

## 2018-08-28 MED ORDER — HYDROMORPHONE HCL 1 MG/ML IJ SOLN
1.0000 mg | INTRAMUSCULAR | Status: DC | PRN
  Administered 2018-08-28: 1 mg via INTRAVENOUS
  Filled 2018-08-28: qty 1

## 2018-08-28 MED ORDER — HYDROMORPHONE HCL 1 MG/ML IJ SOLN
0.2500 mg | INTRAMUSCULAR | Status: DC | PRN
  Administered 2018-08-28 (×2): 0.5 mg via INTRAVENOUS
  Filled 2018-08-28 (×2): qty 0.5

## 2018-08-28 MED ORDER — FENTANYL CITRATE (PF) 100 MCG/2ML IJ SOLN
INTRAMUSCULAR | Status: DC | PRN
  Administered 2018-08-28: 50 ug via INTRAVENOUS
  Administered 2018-08-28: 100 ug via INTRAVENOUS
  Administered 2018-08-28: 50 ug via INTRAVENOUS

## 2018-08-28 MED ORDER — KETOROLAC TROMETHAMINE 30 MG/ML IJ SOLN
30.0000 mg | Freq: Once | INTRAMUSCULAR | Status: AC
  Administered 2018-08-28: 30 mg via INTRAVENOUS
  Filled 2018-08-28: qty 1

## 2018-08-28 MED ORDER — PROPOFOL 10 MG/ML IV BOLUS
INTRAVENOUS | Status: DC | PRN
  Administered 2018-08-28: 20 mg via INTRAVENOUS
  Administered 2018-08-28: 150 mg via INTRAVENOUS

## 2018-08-28 MED ORDER — FENTANYL CITRATE (PF) 100 MCG/2ML IJ SOLN
INTRAMUSCULAR | Status: AC
  Filled 2018-08-28: qty 4

## 2018-08-28 MED ORDER — ONDANSETRON HCL 4 MG/2ML IJ SOLN
4.0000 mg | Freq: Once | INTRAMUSCULAR | Status: AC
  Administered 2018-08-28: 4 mg via INTRAVENOUS
  Filled 2018-08-28: qty 2

## 2018-08-28 MED ORDER — HYDROCODONE-ACETAMINOPHEN 5-325 MG PO TABS: 1.0000 | ORAL_TABLET | ORAL | 0 refills | Status: DC | PRN

## 2018-08-28 MED ORDER — HYDROCODONE-ACETAMINOPHEN 5-325 MG PO TABS
2.0000 | ORAL_TABLET | Freq: Once | ORAL | Status: AC
  Administered 2018-08-28: 2 via ORAL

## 2018-08-28 MED ORDER — LACTATED RINGERS IV SOLN
INTRAVENOUS | Status: DC
  Administered 2018-08-28: 1000 mL via INTRAVENOUS

## 2018-08-28 MED ORDER — MIDAZOLAM HCL 2 MG/2ML IJ SOLN: 0.5000 mg | Freq: Once | INTRAMUSCULAR | Status: DC | PRN

## 2018-08-28 MED ORDER — ROCURONIUM BROMIDE 100 MG/10ML IV SOLN
INTRAVENOUS | Status: DC | PRN
  Administered 2018-08-28: 40 mg via INTRAVENOUS

## 2018-08-28 MED ORDER — ARTIFICIAL TEARS OPHTHALMIC OINT
TOPICAL_OINTMENT | OPHTHALMIC | Status: AC
  Filled 2018-08-28: qty 3.5

## 2018-08-28 MED ORDER — 0.9 % SODIUM CHLORIDE (POUR BTL) OPTIME
TOPICAL | Status: DC | PRN
  Administered 2018-08-28: 1000 mL

## 2018-08-28 MED ORDER — SODIUM CHLORIDE 0.9 % IV BOLUS
1000.0000 mL | Freq: Once | INTRAVENOUS | Status: AC
  Administered 2018-08-28: 1000 mL via INTRAVENOUS

## 2018-08-28 MED ORDER — SUCCINYLCHOLINE CHLORIDE 200 MG/10ML IV SOSY
PREFILLED_SYRINGE | INTRAVENOUS | Status: AC
  Filled 2018-08-28: qty 10

## 2018-08-28 MED ORDER — KETOROLAC TROMETHAMINE 30 MG/ML IJ SOLN
30.0000 mg | Freq: Once | INTRAMUSCULAR | Status: AC
  Administered 2018-08-28: 30 mg via INTRAVENOUS

## 2018-08-28 MED ORDER — SODIUM CHLORIDE 0.9 % IR SOLN
Status: DC | PRN
  Administered 2018-08-28: 3000 mL

## 2018-08-28 MED ORDER — CEFAZOLIN SODIUM-DEXTROSE 2-4 GM/100ML-% IV SOLN
INTRAVENOUS | Status: AC
  Filled 2018-08-28: qty 100

## 2018-08-28 MED ORDER — PROPOFOL 10 MG/ML IV BOLUS
INTRAVENOUS | Status: AC
  Filled 2018-08-28: qty 20

## 2018-08-28 MED ORDER — SUCCINYLCHOLINE CHLORIDE 20 MG/ML IJ SOLN
INTRAMUSCULAR | Status: DC | PRN
  Administered 2018-08-28: 140 mg via INTRAVENOUS

## 2018-08-28 SURGICAL SUPPLY — 53 items
APL PRP STRL LF DISP 70% ISPRP (MISCELLANEOUS) ×1
APL SKNCLS STERI-STRIP NONHPOA (GAUZE/BANDAGES/DRESSINGS) ×1
BENZOIN TINCTURE PRP APPL 2/3 (GAUZE/BANDAGES/DRESSINGS) ×2 IMPLANT
BLADE SURG SZ11 CARB STEEL (BLADE) ×2 IMPLANT
CHLORAPREP W/TINT 26 (MISCELLANEOUS) ×2 IMPLANT
CLOTH BEACON ORANGE TIMEOUT ST (SAFETY) ×2 IMPLANT
COVER LIGHT HANDLE STERIS (MISCELLANEOUS) ×4 IMPLANT
COVER WAND RF STERILE (DRAPES) ×2 IMPLANT
DECANTER SPIKE VIAL GLASS SM (MISCELLANEOUS) ×2 IMPLANT
DRSG TEGADERM 2-3/8X2-3/4 SM (GAUZE/BANDAGES/DRESSINGS) ×2 IMPLANT
ELECT REM PT RETURN 9FT ADLT (ELECTROSURGICAL) ×2
ELECTRODE REM PT RTRN 9FT ADLT (ELECTROSURGICAL) ×1 IMPLANT
FILTER SMOKE EVAC LAPAROSHD (FILTER) ×2 IMPLANT
GAUZE SPONGE 4X4 12PLY STRL (GAUZE/BANDAGES/DRESSINGS) ×2 IMPLANT
GLOVE BIO SURGEON STRL SZ7 (GLOVE) ×2 IMPLANT
GLOVE BIOGEL PI IND STRL 7.0 (GLOVE) ×1 IMPLANT
GLOVE BIOGEL PI IND STRL 9 (GLOVE) ×1 IMPLANT
GLOVE BIOGEL PI INDICATOR 7.0 (GLOVE) ×1
GLOVE BIOGEL PI INDICATOR 9 (GLOVE) ×1
GLOVE ECLIPSE 9.0 STRL (GLOVE) ×2 IMPLANT
GOWN SPEC L3 XXLG W/TWL (GOWN DISPOSABLE) ×4 IMPLANT
GOWN STRL REUS W/TWL LRG LVL3 (GOWN DISPOSABLE) ×2 IMPLANT
INST SET LAPROSCOPIC GYN AP (KITS) ×2 IMPLANT
IV NS IRRIG 3000ML ARTHROMATIC (IV SOLUTION) ×2 IMPLANT
KIT TURNOVER CYSTO (KITS) ×2 IMPLANT
MANIFOLD NEPTUNE II (INSTRUMENTS) ×2 IMPLANT
NEEDLE HYPO 18GX1.5 BLUNT FILL (NEEDLE) IMPLANT
NEEDLE HYPO 25X1 1.5 SAFETY (NEEDLE) ×2 IMPLANT
NEEDLE INSUFFLATION 14GA 120MM (NEEDLE) ×2 IMPLANT
NS IRRIG 1000ML POUR BTL (IV SOLUTION) ×2 IMPLANT
PACK PERI GYN (CUSTOM PROCEDURE TRAY) ×2 IMPLANT
PAD ARMBOARD 7.5X6 YLW CONV (MISCELLANEOUS) ×2 IMPLANT
SET BASIN LINEN APH (SET/KITS/TRAYS/PACK) ×2 IMPLANT
SET TUBE IRRIG SUCTION NO TIP (IRRIGATION / IRRIGATOR) ×2 IMPLANT
SHEARS HARMONIC ACE PLUS 36CM (ENDOMECHANICALS) ×2 IMPLANT
SLEEVE ENDOPATH XCEL 5M (ENDOMECHANICALS) IMPLANT
SOLUTION ANTI FOG 6CC (MISCELLANEOUS) ×2 IMPLANT
STRIP CLOSURE SKIN 1/4X3 (GAUZE/BANDAGES/DRESSINGS) ×2 IMPLANT
SUT VIC AB 4-0 PS2 27 (SUTURE) ×2 IMPLANT
SUT VICRYL 0 UR6 27IN ABS (SUTURE) ×2 IMPLANT
SYR 10ML LL (SYRINGE) ×4 IMPLANT
SYR BULB IRRIGATION 50ML (SYRINGE) ×2 IMPLANT
SYR CONTROL 10ML LL (SYRINGE) ×2 IMPLANT
SYS BAG RETRIEVAL 10MM (BASKET) ×2
SYSTEM BAG RETRIEVAL 10MM (BASKET) ×1 IMPLANT
TRAY FOLEY CATH SILVER 16FR (SET/KITS/TRAYS/PACK) ×2 IMPLANT
TRAY FOLEY MTR SLVR 16FR STAT (SET/KITS/TRAYS/PACK) ×2 IMPLANT
TROCAR ENDO BLADELESS 11MM (ENDOMECHANICALS) ×2 IMPLANT
TROCAR XCEL NON-BLD 5MMX100MML (ENDOMECHANICALS) ×2 IMPLANT
TROCAR Z-THAD FIOS HNDL 12X100 (TROCAR) ×2 IMPLANT
TUBING DYE INJECTION 900-617 (MISCELLANEOUS) IMPLANT
TUBING INSUFFLATION (TUBING) ×2 IMPLANT
WARMER LAPAROSCOPE (MISCELLANEOUS) ×2 IMPLANT

## 2018-08-28 NOTE — ED Triage Notes (Signed)
Pt c/o lower abd pain onset 2030, states she has been fairly regular with bm's, actually had a little diarrhea on Sunday, mild nausea, no vomiting.  Pt has had severe constipation in the past

## 2018-08-28 NOTE — ED Notes (Signed)
Call to preop  No response

## 2018-08-28 NOTE — H&P (Signed)
Signed         Show:Clear all [x] Manual[x] Template[] Copied  Added by: [x] Jonnie Kind, MD  [] Hover for details Reason for Consult:severely painful right ovarian cyst, r/o torsion Referring Physician: ED physician  Maria Velasquez is an 43 y.o. female. She is s/p laparoscopic hysterectomy, by Dr Philis Pique, and she is admitted from the ED for Laparoscopic removal of right ovarian cyst that is enlarged, acutely painful since 8 pm last night, with CT confirmation of ovarian enlargement and u/s measurements that show some internal flow, making complete torsion unlikely, but partial or incomplete torsion cannot be ruled out. The patient received IV dilaudid, with transient reduced pain, but finds the pain levels excessive for outpatient monitoring and desires excision.  The opposite ovary is unremarkable. The patient is counseled over surgery, including specific discussion of risks of complications such as inability to perform the procedure laparoscopically, requiring laparotomy, the possibility of adhesions, or other complications involving adjacent organs.    Pertinent Gynecological History: Menses:  Bleeding:  Contraception: status post hysterectomy DES exposure: unknown Blood transfusions: none Sexually transmitted diseases: no past history Previous GYN Procedures: laparoscopic hysterectomy dr Philis Pique. 2016 Last mammogram: normal Date: 04/2018 Last pap: normal Date:  OB History: G, P           Menstrual History: Menarche age:  Patient's last menstrual period was 10/19/2014 (approximate).      Past Medical History:  Diagnosis Date  . Headache    otc med prn  . SVD (spontaneous vaginal delivery)    x 1         Past Surgical History:  Procedure Laterality Date  . BACK SURGERY  01/30/2007   L4-L5, L5-S1 herniated disk  . DILATION AND CURETTAGE OF UTERUS  08/05/2009   missed abortion  . ROBOTIC ASSISTED BILATERAL SALPINGO OOPHERECTOMY Bilateral 11/16/2014     Procedure: ROBOTIC ASSISTED BILATERAL SALPINGECTOMY;  Surgeon: Bobbye Charleston, MD;  Location: [facility];  Service: Gynecology;  Laterality: Bilateral;  . ROBOTIC ASSISTED TOTAL HYSTERECTOMY N/A 11/16/2014   Procedure: ROBOTIC ASSISTED TOTAL HYSTERECTOMY;  Surgeon: Bobbye Charleston, MD;  Location: [facility];  Service: Gynecology;  Laterality: N/A;  TO FOLLOW FIRDT ROBOTIC CASE. tIME NEEDS TO BE REDUCED TO 3 HOURS.  . TUBAL LIGATION  03/20/2010   filshie clips  . WISDOM TOOTH EXTRACTION           Family History  Problem Relation Age of Onset  . Breast cancer Mother     Social History:  reports that she has never smoked. She has never used smokeless tobacco. She reports current alcohol use. She reports that she does not use drugs.  Allergies:       Allergies  Allergen Reactions  . Oxycodone Itching and Other (See Comments)    Causes the sensation of skin crawling.    Medications: I have reviewed the patient's current medications.  ROS  Blood pressure 115/70, pulse 77, temperature 97.6 F (36.4 C), temperature source Oral, resp. rate 17, height 5\' 3"  (1.6 m), weight 75.8 kg, last menstrual period 10/19/2014, SpO2 100 %. Physical Exam  Constitutional: She is oriented to person, place, and time. She appears well-developed and well-nourished. She appears distressed.  HENT:  Head: Normocephalic and atraumatic.  Eyes: Pupils are equal, round, and reactive to light.  Neck: Neck supple. No tracheal deviation present. No thyromegaly present.  Cardiovascular: Normal rate.  Respiratory: Effort normal.  GI: Soft. There is abdominal tenderness. There is no rebound.  Rlq tenderness to palpation  Genitourinary:  Vagina normal.   Musculoskeletal: Normal range of motion.  Neurological: She is alert and oriented to person, place, and time.  Skin: Skin is warm and dry.  Psychiatric: She has a normal mood and affect. Her behavior is normal. Judgment and thought content normal.           Results for orders placed or performed during the hospital encounter of 08/28/18 (from the past 48 hour(s))  CBC with Differential/Platelet     Status: Abnormal   Collection Time: 08/28/18  1:54 AM  Result Value Ref Range   WBC 17.3 (H) 4.0 - 10.5 K/uL   RBC 4.94 3.87 - 5.11 MIL/uL   Hemoglobin 14.5 12.0 - 15.0 g/dL   HCT 43.8 36.0 - 46.0 %   MCV 88.7 80.0 - 100.0 fL   MCH 29.4 26.0 - 34.0 pg   MCHC 33.1 30.0 - 36.0 g/dL   RDW 12.6 11.5 - 15.5 %   Platelets 321 150 - 400 K/uL   nRBC 0.0 0.0 - 0.2 %   Neutrophils Relative % 78 %   Neutro Abs 13.5 (H) 1.7 - 7.7 K/uL   Lymphocytes Relative 17 %   Lymphs Abs 2.9 0.7 - 4.0 K/uL   Monocytes Relative 4 %   Monocytes Absolute 0.8 0.1 - 1.0 K/uL   Eosinophils Relative 1 %   Eosinophils Absolute 0.1 0.0 - 0.5 K/uL   Basophils Relative 0 %   Basophils Absolute 0.0 0.0 - 0.1 K/uL   Immature Granulocytes 0 %   Abs Immature Granulocytes 0.06 0.00 - 0.07 K/uL    Comment: Performed at [facility], [address] [place], [place] [address]  Comprehensive metabolic panel     Status: Abnormal   Collection Time: 08/28/18  1:54 AM  Result Value Ref Range   Sodium 136 135 - 145 mmol/L   Potassium 3.3 (L) 3.5 - 5.1 mmol/L   Chloride 105 98 - 111 mmol/L   CO2 22 22 - 32 mmol/L   Glucose, Bld 118 (H) 70 - 99 mg/dL   BUN 11 6 - 20 mg/dL   Creatinine, Ser 0.80 0.44 - 1.00 mg/dL   Calcium 9.4 8.9 - 10.3 mg/dL   Total Protein 7.1 6.5 - 8.1 g/dL   Albumin 4.2 3.5 - 5.0 g/dL   AST 18 15 - 41 U/L   ALT 14 0 - 44 U/L   Alkaline Phosphatase 78 38 - 126 U/L   Total Bilirubin 0.6 0.3 - 1.2 mg/dL   GFR calc non Af Amer >60 >60 mL/min   GFR calc Af Amer >60 >60 mL/min   Anion gap 9 5 - 15    Comment: Performed at [facility], [address] [place], [place] [address]  Lipase, blood     Status: None   Collection Time: 08/28/18  1:54 AM  Result Value Ref Range   Lipase 29 11 - 51 U/L     Comment: Performed at [facility], [address] [place],  [address]  Urinalysis, Routine w reflex microscopic     Status: None   Collection Time: 08/28/18  3:27 AM  Result Value Ref Range   Color, Urine YELLOW YELLOW   APPearance CLEAR CLEAR   Specific Gravity, Urine 1.009 1.005 - 1.030   pH 6.0 5.0 - 8.0   Glucose, UA NEGATIVE NEGATIVE mg/dL   Hgb urine dipstick NEGATIVE NEGATIVE   Bilirubin Urine NEGATIVE NEGATIVE   Ketones, ur NEGATIVE NEGATIVE mg/dL   Protein, ur NEGATIVE NEGATIVE  mg/dL   Nitrite NEGATIVE NEGATIVE   Leukocytes,Ua NEGATIVE NEGATIVE    Comment: Performed at [facility], [address] [place], [place] [address]  Pregnancy, urine     Status: None   Collection Time: 08/28/18  3:27 AM  Result Value Ref Range   Preg Test, Ur NEGATIVE NEGATIVE    Comment:        THE SENSITIVITY OF THIS METHODOLOGY IS >20 mIU/mL. Performed at [facility], [address] [place], [place] [address]     US Transvaginal Non-ob  Result Date: 08/28/2018 CLINICAL DATA:  Right adnexal mass EXAM: TRANSABDOMINAL AND TRANSVAGINAL ULTRASOUND OF PELVIS DOPPLER ULTRASOUND OF OVARIES TECHNIQUE: Both transabdominal and transvaginal ultrasound examinations of the pelvis were performed. Transabdominal technique was performed for global imaging of the pelvis including uterus, ovaries, adnexal regions, and pelvic cul-de-sac. It was necessary to proceed with endovaginal exam following the transabdominal exam to visualize the ovaries. Color and duplex Doppler ultrasound was utilized to evaluate blood flow to the ovaries. COMPARISON:  Abdominal CT from earlier today FINDINGS: Uterus Surgically absent Right ovary Measurements: 6 x 7 x 4 cm = volume: 90 mL. The ovary has a heterogeneous appearance without discrete mass. Follicles are seen peripherally. Small volume fluid about the right ovary with particulate like echoes. Left ovary Not visualized but normal size on prior abdominal  CT Pulsed Doppler evaluation of both visible right ovary demonstrates normal low-resistance arterial and venous waveforms. IMPRESSION: Only the enlarged right ovary was visualized sonographically and normal ovarian blood flow is present. The ovary is significantly enlarged without discrete underlying mass lesion. Torsion/detorsion is a consideration. There could also be an occult underlying hemorrhagic cyst given there is some small volume particulate pelvic fluid about the right adnexa. A neoplasm is not favored given the CT appearance on 12/29/2017 abdominal CT, but need short term follow-up ultrasound to document normalization. Electronically Signed   By: Monte Fantasia M.D.   On: 08/28/2018 06:08   US Pelvis Complete  Result Date: 08/28/2018 CLINICAL DATA:  Right adnexal mass EXAM: TRANSABDOMINAL AND TRANSVAGINAL ULTRASOUND OF PELVIS DOPPLER ULTRASOUND OF OVARIES TECHNIQUE: Both transabdominal and transvaginal ultrasound examinations of the pelvis were performed. Transabdominal technique was performed for global imaging of the pelvis including uterus, ovaries, adnexal regions, and pelvic cul-de-sac. It was necessary to proceed with endovaginal exam following the transabdominal exam to visualize the ovaries. Color and duplex Doppler ultrasound was utilized to evaluate blood flow to the ovaries. COMPARISON:  Abdominal CT from earlier today FINDINGS: Uterus Surgically absent Right ovary Measurements: 6 x 7 x 4 cm = volume: 90 mL. The ovary has a heterogeneous appearance without discrete mass. Follicles are seen peripherally. Small volume fluid about the right ovary with particulate like echoes. Left ovary Not visualized but normal size on prior abdominal CT Pulsed Doppler evaluation of both visible right ovary demonstrates normal low-resistance arterial and venous waveforms. IMPRESSION: Only the enlarged right ovary was visualized sonographically and normal ovarian blood flow is present. The ovary is  significantly enlarged without discrete underlying mass lesion. Torsion/detorsion is a consideration. There could also be an occult underlying hemorrhagic cyst given there is some small volume particulate pelvic fluid about the right adnexa. A neoplasm is not favored given the CT appearance on 12/29/2017 abdominal CT, but need short term follow-up ultrasound to document normalization. Electronically Signed   By: Monte Fantasia M.D.   On: 08/28/2018 06:08   Ct Abdomen Pelvis W Contrast  Result Date: 08/28/2018 CLINICAL DATA:  Lower  abdominal pain onset 2030 hours. EXAM: CT ABDOMEN AND PELVIS WITH CONTRAST TECHNIQUE: Multidetector CT imaging of the abdomen and pelvis was performed using the standard protocol following bolus administration of intravenous contrast. CONTRAST:  1101mL OMNIPAQUE IOHEXOL 300 MG/ML  SOLN COMPARISON:  01/08/2018 FINDINGS: LOWER CHEST: Lung bases are clear. Included heart size is normal. No pericardial effusion. HEPATOBILIARY: Tiny too small to characterize hypodensities within the liver statistically consistent with cysts. No biliary dilatation. No enhancing mass. The gallbladder is contracted without stones. PANCREAS: Normal. SPLEEN: Normal. ADRENALS/URINARY TRACT: Kidneys are orthotopic, demonstrating symmetric enhancement. No nephrolithiasis, hydronephrosis or solid renal masses. Urinary bladder is partially distended and unremarkable. Normal adrenal glands. STOMACH/BOWEL: The stomach, small and large bowel are normal in course and caliber without inflammatory changes. Normal appendix. VASCULAR/LYMPHATIC: Aortoiliac vessels are normal in course and caliber. No lymphadenopathy by CT size criteria. REPRODUCTIVE: Right adnexal heterogeneous masslike abnormality 5.9 x 3.5 x 4 cm likely representing the right ovary. Scattered areas of hypodensity may represent small follicles or cysts within. The possibility of torsion is not excluded and should be correlated clinically and if necessary by  ultrasound. The left ovary is unremarkable. Status post hysterectomy and bilateral salpingectomy by report in 2016. OTHER: No intraperitoneal free fluid or free air. MUSCULOSKELETAL: Degenerative disc disease L5-S1. IMPRESSION: 1. Heterogeneous, enlarged appearing right adnexal mass likely representing the right ovary measuring 5.9 x 3.5 x 4 cm. Correlation with ultrasound is recommended for further characterization and to exclude the possibility of torsion accounting for its enlargement. The left ovary is normal appearance. The patient is status post hysterectomy. 2. Multiple subcentimeter low-density lesions throughout the liver consistent small cysts or biliary hamartomas. Electronically Signed   By: Ashley Royalty M.D.   On: 08/28/2018 03:54   [place] Art/ven Flow Abd Pelv Doppler  Result Date: 08/28/2018 CLINICAL DATA:  Right adnexal mass EXAM: TRANSABDOMINAL AND TRANSVAGINAL ULTRASOUND OF PELVIS DOPPLER ULTRASOUND OF OVARIES TECHNIQUE: Both transabdominal and transvaginal ultrasound examinations of the pelvis were performed. Transabdominal technique was performed for global imaging of the pelvis including uterus, ovaries, adnexal regions, and pelvic cul-de-sac. It was necessary to proceed with endovaginal exam following the transabdominal exam to visualize the ovaries. Color and duplex Doppler ultrasound was utilized to evaluate blood flow to the ovaries. COMPARISON:  Abdominal CT from earlier today FINDINGS: Uterus Surgically absent Right ovary Measurements: 6 x 7 x 4 cm = volume: 90 mL. The ovary has a heterogeneous appearance without discrete mass. Follicles are seen peripherally. Small volume fluid about the right ovary with particulate like echoes. Left ovary Not visualized but normal size on prior abdominal CT Pulsed Doppler evaluation of both visible right ovary demonstrates normal low-resistance arterial and venous waveforms. IMPRESSION: Only the enlarged right ovary was visualized sonographically and  normal ovarian blood flow is present. The ovary is significantly enlarged without discrete underlying mass lesion. Torsion/detorsion is a consideration. There could also be an occult underlying hemorrhagic cyst given there is some small volume particulate pelvic fluid about the right adnexa. A neoplasm is not favored given the CT appearance on 12/29/2017 abdominal CT, but need short term follow-up ultrasound to document normalization. Electronically Signed   By: Monte Fantasia M.D.   On: 08/28/2018 06:08    Assessment/Plan: Hemorhagic Ovary on right,  Rule out incomplete torsion  Counselled over requested removal of right ovary( and tube if present.) NPO To OR as add on this early afternoon. Jonnie Kind 08/28/2018

## 2018-08-28 NOTE — Anesthesia Procedure Notes (Signed)
Procedure Name: Intubation Date/Time: 08/28/2018 1:49 PM Performed by: Vista Deck, CRNA Pre-anesthesia Checklist: Patient identified, Patient being monitored, Timeout performed, Emergency Drugs available and Suction available Patient Re-evaluated:Patient Re-evaluated prior to induction Oxygen Delivery Method: Circle System Utilized Preoxygenation: Pre-oxygenation with 100% oxygen Induction Type: IV induction Ventilation: Mask ventilation without difficulty Laryngoscope Size: Mac and 3 Grade View: Grade I Tube type: Oral Tube size: 7.0 mm Number of attempts: 1 Airway Equipment and Method: stylet and Oral airway Placement Confirmation: ETT inserted through vocal cords under direct vision,  positive ETCO2 and breath sounds checked- equal and bilateral Secured at: 21 cm Tube secured with: Tape Dental Injury: Teeth and Oropharynx as per pre-operative assessment

## 2018-08-28 NOTE — Transfer of Care (Signed)
Immediate Anesthesia Transfer of Care Note  Patient: Maria Velasquez  Procedure(s) Performed: LAPAROSCOPIC OOPHORECTOMY (Right Abdomen)  Patient Location: PACU  Anesthesia Type:General  Level of Consciousness: awake and patient cooperative  Airway & Oxygen Therapy: Patient Spontanous Breathing and non-rebreather face mask  Post-op Assessment: Report given to RN and Post -op Vital signs reviewed and stable  Post vital signs: Reviewed and stable  Last Vitals:  Vitals Value Taken Time  BP    Temp    Pulse    Resp    SpO2      Last Pain:  Vitals:   08/28/18 1243  TempSrc: Oral  PainSc: 5       Patients Stated Pain Goal: 8 (76/[id number]/41 [id number])  Complications: No apparent anesthesia complications

## 2018-08-28 NOTE — ED Provider Notes (Signed)
[facility] EMERGENCY DEPARTMENT Provider Note   CSN: [id number] Arrival date & time: 08/28/18  0120    History   Chief Complaint Chief Complaint  Patient presents with  . Abdominal Pain    HPI Maria Velasquez is a 43 y.o. female.     Patient presents with severe lower abdominal pain onset about 8:30 PM.  States the pain is constant and radiates to her right low back.  She reports trying to use an enema at home tonight because she thought she could have been constipated but did not have much relief.  She actually had diarrhea about 4 days ago and had a normal bowel movement 2 days ago.  She states she has been fairly regular normally.  She denies vomiting but has had nausea.  No pain with urination or blood in the urine.  No vaginal bleeding or discharge.  No chest pain or shortness of breath.  She reports this pain is similar to when she was seen in July and diagnosed with constipation.  Patient with history of hysterectomy and bilateral salpingo-oophorectomy.  Still has appendix.  The history is provided by the patient.  Abdominal Pain  Associated symptoms: constipation, diarrhea, dysuria and nausea   Associated symptoms: no chest pain, no shortness of breath and no vomiting     Past Medical History:  Diagnosis Date  . Headache    otc med prn  . SVD (spontaneous vaginal delivery)    x 1    Patient Active Problem List   Diagnosis Date Noted  . Postoperative state 11/16/2014    Past Surgical History:  Procedure Laterality Date  . BACK SURGERY  01/30/2007   L4-L5, L5-S1 herniated disk  . DILATION AND CURETTAGE OF UTERUS  08/05/2009   missed abortion  . ROBOTIC ASSISTED BILATERAL SALPINGO OOPHERECTOMY Bilateral 11/16/2014   Procedure: ROBOTIC ASSISTED BILATERAL SALPINGECTOMY;  Surgeon: Bobbye Charleston, MD;  Location: [facility];  Service: Gynecology;  Laterality: Bilateral;  . ROBOTIC ASSISTED TOTAL HYSTERECTOMY N/A 11/16/2014   Procedure: ROBOTIC ASSISTED TOTAL HYSTERECTOMY;   Surgeon: Bobbye Charleston, MD;  Location: [facility];  Service: Gynecology;  Laterality: N/A;  TO FOLLOW FIRDT ROBOTIC CASE. tIME NEEDS TO BE REDUCED TO 3 HOURS.  . TUBAL LIGATION  03/20/2010   filshie clips  . WISDOM TOOTH EXTRACTION       OB History   No obstetric history on file.      Home Medications    Prior to Admission medications   Medication Sig Start Date End Date Taking? Authorizing Provider  HYDROcodone-acetaminophen (NORCO/VICODIN) 5-325 MG per tablet Take 1-2 tablets by mouth every 4 (four) hours as needed for severe pain. 11/17/14   Bobbye Charleston, MD  ibuprofen (ADVIL,MOTRIN) 200 MG tablet Take 200-800 mg by mouth 2 (two) times daily as needed for headache or mild pain.    [provider]  phentermine 15 MG capsule Take 15 mg by mouth every morning.    [provider]  polyethylene glycol (GOLYTELY) 236 g solution Drink 8 ounces at a time, every 1-2 hours until you have a bowel movement 12/29/17   Pollina, Gwenyth Allegra, MD    Family History Family History  Problem Relation Age of Onset  . Breast cancer Mother     Social History Social History   Tobacco Use  . Smoking status: Never Smoker  . Smokeless tobacco: Never Used  Substance Use Topics  . Alcohol use: Yes    Comment: socially  . Drug use: No  Allergies   Oxycodone   Review of Systems Review of Systems  Constitutional: Positive for activity change and appetite change.  HENT: Negative for congestion.   Respiratory: Negative for chest tightness and shortness of breath.   Cardiovascular: Negative for chest pain.  Gastrointestinal: Positive for abdominal pain, constipation, diarrhea and nausea. Negative for vomiting.  Genitourinary: Positive for dysuria and frequency. Negative for urgency.  Musculoskeletal: Positive for back pain. Negative for arthralgias and myalgias.  Skin: Negative for rash.  Neurological: Negative for dizziness, weakness and headaches.   all other systems  are negative except as noted in the HPI and PMH.     Physical Exam Updated Vital Signs BP 119/77 (BP Location: Left Arm)   Pulse 74   Temp 97.6 F (36.4 C) (Oral)   Resp 18   Ht 5\' 3"  (1.6 m)   Wt 75.8 kg   LMP 10/19/2014 (Approximate)   SpO2 100%   BMI 29.58 kg/m   Physical Exam Vitals signs and nursing note reviewed.  Constitutional:      General: She is not in acute distress.    Appearance: She is well-developed.     Comments: Uncomfortable  HENT:     Head: Normocephalic and atraumatic.     Mouth/Throat:     Pharynx: No oropharyngeal exudate.  Eyes:     Conjunctiva/sclera: Conjunctivae normal.     Pupils: Pupils are equal, round, and reactive to light.  Neck:     Musculoskeletal: Normal range of motion and neck supple.     Comments: No meningismus. Cardiovascular:     Rate and Rhythm: Normal rate and regular rhythm.     Heart sounds: Normal heart sounds. No murmur.  Pulmonary:     Effort: Pulmonary effort is normal. No respiratory distress.     Breath sounds: Normal breath sounds.  Abdominal:     Palpations: Abdomen is soft.     Tenderness: There is abdominal tenderness. There is guarding. There is no rebound.     Comments: Periumbilical and right lower quadrant guarding with tenderness.  Musculoskeletal: Normal range of motion.        General: Tenderness present.     Comments: Right paraspinal lumbar tenderness  Skin:    General: Skin is warm.  Neurological:     Mental Status: She is alert and oriented to person, place, and time.     Cranial Nerves: No cranial nerve deficit.     Motor: No abnormal muscle tone.     Coordination: Coordination normal.     Comments: No ataxia on finger to nose bilaterally. No pronator drift. 5/5 strength throughout. CN 2-12 intact.Equal grip strength. Sensation intact.   Psychiatric:        Behavior: Behavior normal.      ED Treatments / Results  Labs (all labs ordered are listed, but only abnormal results are  displayed) Labs Reviewed  CBC WITH DIFFERENTIAL/PLATELET - Abnormal; Notable for the following components:      Result Value   WBC 17.3 (*)    Neutro Abs 13.5 (*)    All other components within normal limits  COMPREHENSIVE METABOLIC PANEL - Abnormal; Notable for the following components:   Potassium 3.3 (*)    Glucose, Bld 118 (*)    All other components within normal limits  URINALYSIS, ROUTINE W REFLEX MICROSCOPIC  PREGNANCY, URINE  LIPASE, BLOOD    EKG None  Radiology US Transvaginal Non-ob  Result Date: 08/28/2018 CLINICAL DATA:  Right adnexal mass EXAM: TRANSABDOMINAL AND  TRANSVAGINAL ULTRASOUND OF PELVIS DOPPLER ULTRASOUND OF OVARIES TECHNIQUE: Both transabdominal and transvaginal ultrasound examinations of the pelvis were performed. Transabdominal technique was performed for global imaging of the pelvis including uterus, ovaries, adnexal regions, and pelvic cul-de-sac. It was necessary to proceed with endovaginal exam following the transabdominal exam to visualize the ovaries. Color and duplex Doppler ultrasound was utilized to evaluate blood flow to the ovaries. COMPARISON:  Abdominal CT from earlier today FINDINGS: Uterus Surgically absent Right ovary Measurements: 6 x 7 x 4 cm = volume: 90 mL. The ovary has a heterogeneous appearance without discrete mass. Follicles are seen peripherally. Small volume fluid about the right ovary with particulate like echoes. Left ovary Not visualized but normal size on prior abdominal CT Pulsed Doppler evaluation of both visible right ovary demonstrates normal low-resistance arterial and venous waveforms. IMPRESSION: Only the enlarged right ovary was visualized sonographically and normal ovarian blood flow is present. The ovary is significantly enlarged without discrete underlying mass lesion. Torsion/detorsion is a consideration. There could also be an occult underlying hemorrhagic cyst given there is some small volume particulate pelvic fluid about  the right adnexa. A neoplasm is not favored given the CT appearance on 12/29/2017 abdominal CT, but need short term follow-up ultrasound to document normalization. Electronically Signed   By: Monte Fantasia M.D.   On: 08/28/2018 06:08   US Pelvis Complete  Result Date: 08/28/2018 CLINICAL DATA:  Right adnexal mass EXAM: TRANSABDOMINAL AND TRANSVAGINAL ULTRASOUND OF PELVIS DOPPLER ULTRASOUND OF OVARIES TECHNIQUE: Both transabdominal and transvaginal ultrasound examinations of the pelvis were performed. Transabdominal technique was performed for global imaging of the pelvis including uterus, ovaries, adnexal regions, and pelvic cul-de-sac. It was necessary to proceed with endovaginal exam following the transabdominal exam to visualize the ovaries. Color and duplex Doppler ultrasound was utilized to evaluate blood flow to the ovaries. COMPARISON:  Abdominal CT from earlier today FINDINGS: Uterus Surgically absent Right ovary Measurements: 6 x 7 x 4 cm = volume: 90 mL. The ovary has a heterogeneous appearance without discrete mass. Follicles are seen peripherally. Small volume fluid about the right ovary with particulate like echoes. Left ovary Not visualized but normal size on prior abdominal CT Pulsed Doppler evaluation of both visible right ovary demonstrates normal low-resistance arterial and venous waveforms. IMPRESSION: Only the enlarged right ovary was visualized sonographically and normal ovarian blood flow is present. The ovary is significantly enlarged without discrete underlying mass lesion. Torsion/detorsion is a consideration. There could also be an occult underlying hemorrhagic cyst given there is some small volume particulate pelvic fluid about the right adnexa. A neoplasm is not favored given the CT appearance on 12/29/2017 abdominal CT, but need short term follow-up ultrasound to document normalization. Electronically Signed   By: Monte Fantasia M.D.   On: 08/28/2018 06:08   Ct Abdomen Pelvis W  Contrast  Result Date: 08/28/2018 CLINICAL DATA:  Lower abdominal pain onset 2030 hours. EXAM: CT ABDOMEN AND PELVIS WITH CONTRAST TECHNIQUE: Multidetector CT imaging of the abdomen and pelvis was performed using the standard protocol following bolus administration of intravenous contrast. CONTRAST:  164mL OMNIPAQUE IOHEXOL 300 MG/ML  SOLN COMPARISON:  01/08/2018 FINDINGS: LOWER CHEST: Lung bases are clear. Included heart size is normal. No pericardial effusion. HEPATOBILIARY: Tiny too small to characterize hypodensities within the liver statistically consistent with cysts. No biliary dilatation. No enhancing mass. The gallbladder is contracted without stones. PANCREAS: Normal. SPLEEN: Normal. ADRENALS/URINARY TRACT: Kidneys are orthotopic, demonstrating symmetric enhancement. No nephrolithiasis, hydronephrosis or solid renal masses.  Urinary bladder is partially distended and unremarkable. Normal adrenal glands. STOMACH/BOWEL: The stomach, small and large bowel are normal in course and caliber without inflammatory changes. Normal appendix. VASCULAR/LYMPHATIC: Aortoiliac vessels are normal in course and caliber. No lymphadenopathy by CT size criteria. REPRODUCTIVE: Right adnexal heterogeneous masslike abnormality 5.9 x 3.5 x 4 cm likely representing the right ovary. Scattered areas of hypodensity may represent small follicles or cysts within. The possibility of torsion is not excluded and should be correlated clinically and if necessary by ultrasound. The left ovary is unremarkable. Status post hysterectomy and bilateral salpingectomy by report in 2016. OTHER: No intraperitoneal free fluid or free air. MUSCULOSKELETAL: Degenerative disc disease L5-S1. IMPRESSION: 1. Heterogeneous, enlarged appearing right adnexal mass likely representing the right ovary measuring 5.9 x 3.5 x 4 cm. Correlation with ultrasound is recommended for further characterization and to exclude the possibility of torsion accounting for its  enlargement. The left ovary is normal appearance. The patient is status post hysterectomy. 2. Multiple subcentimeter low-density lesions throughout the liver consistent small cysts or biliary hamartomas. Electronically Signed   By: Ashley Royalty M.D.   On: 08/28/2018 03:54   [place] Art/ven Flow Abd Pelv Doppler  Result Date: 08/28/2018 CLINICAL DATA:  Right adnexal mass EXAM: TRANSABDOMINAL AND TRANSVAGINAL ULTRASOUND OF PELVIS DOPPLER ULTRASOUND OF OVARIES TECHNIQUE: Both transabdominal and transvaginal ultrasound examinations of the pelvis were performed. Transabdominal technique was performed for global imaging of the pelvis including uterus, ovaries, adnexal regions, and pelvic cul-de-sac. It was necessary to proceed with endovaginal exam following the transabdominal exam to visualize the ovaries. Color and duplex Doppler ultrasound was utilized to evaluate blood flow to the ovaries. COMPARISON:  Abdominal CT from earlier today FINDINGS: Uterus Surgically absent Right ovary Measurements: 6 x 7 x 4 cm = volume: 90 mL. The ovary has a heterogeneous appearance without discrete mass. Follicles are seen peripherally. Small volume fluid about the right ovary with particulate like echoes. Left ovary Not visualized but normal size on prior abdominal CT Pulsed Doppler evaluation of both visible right ovary demonstrates normal low-resistance arterial and venous waveforms. IMPRESSION: Only the enlarged right ovary was visualized sonographically and normal ovarian blood flow is present. The ovary is significantly enlarged without discrete underlying mass lesion. Torsion/detorsion is a consideration. There could also be an occult underlying hemorrhagic cyst given there is some small volume particulate pelvic fluid about the right adnexa. A neoplasm is not favored given the CT appearance on 12/29/2017 abdominal CT, but need short term follow-up ultrasound to document normalization. Electronically Signed   By: Monte Fantasia  M.D.   On: 08/28/2018 06:08    Procedures Procedures (including critical care time)  Medications Ordered in ED Medications  fentaNYL (SUBLIMAZE) injection 50 mcg (50 mcg Intravenous Given 08/28/18 0230)  ondansetron (ZOFRAN) injection 4 mg (4 mg Intravenous Given 08/28/18 0230)  sodium chloride 0.9 % bolus 1,000 mL (1,000 mLs Intravenous New Bag/Given 08/28/18 0228)     Initial Impression / Assessment and Plan / ED Course  I have reviewed the triage vital signs and the nursing notes.  Pertinent labs & imaging results that were available during my care of the patient were reviewed by me and considered in my medical decision making (see chart for details).       Patient with sudden onset of lower abdominal pain with nausea.  History of constipation in the past with similar pain but states she has been regular.  No urinary or vaginal symptoms.  Patient appears uncomfortable.  Her urinalysis is negative CT scan obtained to evaluate for kidney stone versus appendicitis. Pain was rather acute onset.  CT shows right adnexal mass which is large and torsion cannot be ruled out.  We will proceed with emergent ultrasound.  Patient's chart documents that patient had bilateral salpingo-oophorectomy in 2016 with her hysterectomy.  However patient reports that her ovaries were left in place.  Ultrasound shows normal blood flow but enlarged ovary with concern for torsion/detorsion.  This was discussed with Dr. Glo Herring of gynecology who came to evaluate patient.  Patient has seen Dr. Philis Pique of [facility] in the past.   Dr. Glo Herring planning on likely taking patient to the OR. Continue NPO and pain control.   CRITICAL CARE Performed by: Ezequiel Essex Total critical care time: 60 minutes Critical care time was exclusive of separately billable procedures and treating other patients. Critical care was necessary to treat or prevent imminent or life-threatening deterioration. Critical care  was time spent personally by me on the following activities: development of treatment plan with patient and/or surrogate as well as nursing, discussions with consultants, evaluation of patient's response to treatment, examination of patient, obtaining history from patient or surrogate, ordering and performing treatments and interventions, ordering and review of laboratory studies, ordering and review of radiographic studies, pulse oximetry and re-evaluation of patient's condition.   Final Clinical Impressions(s) / ED Diagnoses   Final diagnoses:  Pelvic pain in female  Ovarian torsion    ED Discharge Orders    None       Prosper Paff, Annie Main, MD 08/28/18 [phone]

## 2018-08-28 NOTE — Consult Note (Signed)
Reason for Consult:severely painful right ovarian cyst, r/o torsion Referring Physician: ED physician  Maria Velasquez is an 43 y.o. female. She is s/p laparoscopic hysterectomy, by Dr Philis Pique, and she is admitted from the ED for Laparoscopic removal of right ovarian cyst that is enlarged, acutely painful since 8 pm last night, with CT confirmation of ovarian enlargement and u/s measurements that show some internal flow, making complete torsion unlikely, but partial or incomplete torsion cannot be ruled out. The patient received IV dilaudid, with transient reduced pain, but finds the pain levels excessive for outpatient monitoring and desires excision.  The opposite ovary is unremarkable. The patient is counseled over surgery, including specific discussion of risks of complications such as inability to perform the procedure laparoscopically, requiring laparotomy, the possibility of adhesions, or other complications involving adjacent organs.    Pertinent Gynecological History: Menses:  Bleeding:  Contraception: status post hysterectomy DES exposure: unknown Blood transfusions: none Sexually transmitted diseases: no past history Previous GYN Procedures: laparoscopic hysterectomy dr Philis Pique. 2016 Last mammogram: normal Date: 04/2018 Last pap: normal Date:  OB History: G, P   Menstrual History: Menarche age:  Patient's last menstrual period was 10/19/2014 (approximate).    Past Medical History:  Diagnosis Date  . Headache    otc med prn  . SVD (spontaneous vaginal delivery)    x 1    Past Surgical History:  Procedure Laterality Date  . BACK SURGERY  01/30/2007   L4-L5, L5-S1 herniated disk  . DILATION AND CURETTAGE OF UTERUS  08/05/2009   missed abortion  . ROBOTIC ASSISTED BILATERAL SALPINGO OOPHERECTOMY Bilateral 11/16/2014   Procedure: ROBOTIC ASSISTED BILATERAL SALPINGECTOMY;  Surgeon: Bobbye Charleston, MD;  Location: [facility];  Service: Gynecology;  Laterality: Bilateral;  .  ROBOTIC ASSISTED TOTAL HYSTERECTOMY N/A 11/16/2014   Procedure: ROBOTIC ASSISTED TOTAL HYSTERECTOMY;  Surgeon: Bobbye Charleston, MD;  Location: [facility];  Service: Gynecology;  Laterality: N/A;  TO FOLLOW FIRDT ROBOTIC CASE. tIME NEEDS TO BE REDUCED TO 3 HOURS.  . TUBAL LIGATION  03/20/2010   filshie clips  . WISDOM TOOTH EXTRACTION      Family History  Problem Relation Age of Onset  . Breast cancer Mother     Social History:  reports that she has never smoked. She has never used smokeless tobacco. She reports current alcohol use. She reports that she does not use drugs.  Allergies:  Allergies  Allergen Reactions  . Oxycodone Itching and Other (See Comments)    Causes the sensation of skin crawling.    Medications: I have reviewed the patient's current medications.  ROS  Blood pressure 115/70, pulse 77, temperature 97.6 F (36.4 C), temperature source Oral, resp. rate 17, height 5\' 3"  (1.6 m), weight 75.8 kg, last menstrual period 10/19/2014, SpO2 100 %. Physical Exam  Constitutional: She is oriented to person, place, and time. She appears well-developed and well-nourished. She appears distressed.  HENT:  Head: Normocephalic and atraumatic.  Eyes: Pupils are equal, round, and reactive to light.  Neck: Neck supple. No tracheal deviation present. No thyromegaly present.  Cardiovascular: Normal rate.  Respiratory: Effort normal.  GI: Soft. There is abdominal tenderness. There is no rebound.  Rlq tenderness to palpation  Genitourinary:    Vagina normal.   Musculoskeletal: Normal range of motion.  Neurological: She is alert and oriented to person, place, and time.  Skin: Skin is warm and dry.  Psychiatric: She has a normal mood and affect. Her behavior is normal. Judgment and thought content normal.  Results for orders placed or performed during the hospital encounter of 08/28/18 (from the past 48 hour(s))  CBC with Differential/Platelet     Status: Abnormal   Collection Time:  08/28/18  1:54 AM  Result Value Ref Range   WBC 17.3 (H) 4.0 - 10.5 K/uL   RBC 4.94 3.87 - 5.11 MIL/uL   Hemoglobin 14.5 12.0 - 15.0 g/dL   HCT 43.8 36.0 - 46.0 %   MCV 88.7 80.0 - 100.0 fL   MCH 29.4 26.0 - 34.0 pg   MCHC 33.1 30.0 - 36.0 g/dL   RDW 12.6 11.5 - 15.5 %   Platelets 321 150 - 400 K/uL   nRBC 0.0 0.0 - 0.2 %   Neutrophils Relative % 78 %   Neutro Abs 13.5 (H) 1.7 - 7.7 K/uL   Lymphocytes Relative 17 %   Lymphs Abs 2.9 0.7 - 4.0 K/uL   Monocytes Relative 4 %   Monocytes Absolute 0.8 0.1 - 1.0 K/uL   Eosinophils Relative 1 %   Eosinophils Absolute 0.1 0.0 - 0.5 K/uL   Basophils Relative 0 %   Basophils Absolute 0.0 0.0 - 0.1 K/uL   Immature Granulocytes 0 %   Abs Immature Granulocytes 0.06 0.00 - 0.07 K/uL    Comment: Performed at [facility], [address], [place], [place] [address]  Comprehensive metabolic panel     Status: Abnormal   Collection Time: 08/28/18  1:54 AM  Result Value Ref Range   Sodium 136 135 - 145 mmol/L   Potassium 3.3 (L) 3.5 - 5.1 mmol/L   Chloride 105 98 - 111 mmol/L   CO2 22 22 - 32 mmol/L   Glucose, Bld 118 (H) 70 - 99 mg/dL   BUN 11 6 - 20 mg/dL   Creatinine, Ser 0.80 0.44 - 1.00 mg/dL   Calcium 9.4 8.9 - 10.3 mg/dL   Total Protein 7.1 6.5 - 8.1 g/dL   Albumin 4.2 3.5 - 5.0 g/dL   AST 18 15 - 41 U/L   ALT 14 0 - 44 U/L   Alkaline Phosphatase 78 38 - 126 U/L   Total Bilirubin 0.6 0.3 - 1.2 mg/dL   GFR calc non Af Amer >60 >60 mL/min   GFR calc Af Amer >60 >60 mL/min   Anion gap 9 5 - 15    Comment: Performed at [facility], [address], [place], [place] [address]  Lipase, blood     Status: None   Collection Time: 08/28/18  1:54 AM  Result Value Ref Range   Lipase 29 11 - 51 U/L    Comment: Performed at [facility], [address], [place], [place] [address]  Urinalysis, Routine w reflex microscopic     Status: None   Collection Time: 08/28/18  3:27 AM  Result Value Ref Range   Color, Urine YELLOW YELLOW   APPearance  CLEAR CLEAR   Specific Gravity, Urine 1.009 1.005 - 1.030   pH 6.0 5.0 - 8.0   Glucose, UA NEGATIVE NEGATIVE mg/dL   Hgb urine dipstick NEGATIVE NEGATIVE   Bilirubin Urine NEGATIVE NEGATIVE   Ketones, ur NEGATIVE NEGATIVE mg/dL   Protein, ur NEGATIVE NEGATIVE mg/dL   Nitrite NEGATIVE NEGATIVE   Leukocytes,Ua NEGATIVE NEGATIVE    Comment: Performed at [facility], [address], [place], [place] [address]  Pregnancy, urine     Status: None   Collection Time: 08/28/18  3:27 AM  Result Value Ref Range   Preg Test, Ur NEGATIVE NEGATIVE  Comment:        THE SENSITIVITY OF THIS METHODOLOGY IS >20 mIU/mL. Performed at [facility], [address] [place], [place] [address]     US Transvaginal Non-ob  Result Date: 08/28/2018 CLINICAL DATA:  Right adnexal mass EXAM: TRANSABDOMINAL AND TRANSVAGINAL ULTRASOUND OF PELVIS DOPPLER ULTRASOUND OF OVARIES TECHNIQUE: Both transabdominal and transvaginal ultrasound examinations of the pelvis were performed. Transabdominal technique was performed for global imaging of the pelvis including uterus, ovaries, adnexal regions, and pelvic cul-de-sac. It was necessary to proceed with endovaginal exam following the transabdominal exam to visualize the ovaries. Color and duplex Doppler ultrasound was utilized to evaluate blood flow to the ovaries. COMPARISON:  Abdominal CT from earlier today FINDINGS: Uterus Surgically absent Right ovary Measurements: 6 x 7 x 4 cm = volume: 90 mL. The ovary has a heterogeneous appearance without discrete mass. Follicles are seen peripherally. Small volume fluid about the right ovary with particulate like echoes. Left ovary Not visualized but normal size on prior abdominal CT Pulsed Doppler evaluation of both visible right ovary demonstrates normal low-resistance arterial and venous waveforms. IMPRESSION: Only the enlarged right ovary was visualized sonographically and normal ovarian blood flow is present. The ovary is significantly  enlarged without discrete underlying mass lesion. Torsion/detorsion is a consideration. There could also be an occult underlying hemorrhagic cyst given there is some small volume particulate pelvic fluid about the right adnexa. A neoplasm is not favored given the CT appearance on 12/29/2017 abdominal CT, but need short term follow-up ultrasound to document normalization. Electronically Signed   By: Monte Fantasia M.D.   On: 08/28/2018 06:08   US Pelvis Complete  Result Date: 08/28/2018 CLINICAL DATA:  Right adnexal mass EXAM: TRANSABDOMINAL AND TRANSVAGINAL ULTRASOUND OF PELVIS DOPPLER ULTRASOUND OF OVARIES TECHNIQUE: Both transabdominal and transvaginal ultrasound examinations of the pelvis were performed. Transabdominal technique was performed for global imaging of the pelvis including uterus, ovaries, adnexal regions, and pelvic cul-de-sac. It was necessary to proceed with endovaginal exam following the transabdominal exam to visualize the ovaries. Color and duplex Doppler ultrasound was utilized to evaluate blood flow to the ovaries. COMPARISON:  Abdominal CT from earlier today FINDINGS: Uterus Surgically absent Right ovary Measurements: 6 x 7 x 4 cm = volume: 90 mL. The ovary has a heterogeneous appearance without discrete mass. Follicles are seen peripherally. Small volume fluid about the right ovary with particulate like echoes. Left ovary Not visualized but normal size on prior abdominal CT Pulsed Doppler evaluation of both visible right ovary demonstrates normal low-resistance arterial and venous waveforms. IMPRESSION: Only the enlarged right ovary was visualized sonographically and normal ovarian blood flow is present. The ovary is significantly enlarged without discrete underlying mass lesion. Torsion/detorsion is a consideration. There could also be an occult underlying hemorrhagic cyst given there is some small volume particulate pelvic fluid about the right adnexa. A neoplasm is not favored given  the CT appearance on 12/29/2017 abdominal CT, but need short term follow-up ultrasound to document normalization. Electronically Signed   By: Monte Fantasia M.D.   On: 08/28/2018 06:08   Ct Abdomen Pelvis W Contrast  Result Date: 08/28/2018 CLINICAL DATA:  Lower abdominal pain onset 2030 hours. EXAM: CT ABDOMEN AND PELVIS WITH CONTRAST TECHNIQUE: Multidetector CT imaging of the abdomen and pelvis was performed using the standard protocol following bolus administration of intravenous contrast. CONTRAST:  150mL OMNIPAQUE IOHEXOL 300 MG/ML  SOLN COMPARISON:  01/08/2018 FINDINGS: LOWER CHEST: Lung bases are clear. Included heart size is normal. No  pericardial effusion. HEPATOBILIARY: Tiny too small to characterize hypodensities within the liver statistically consistent with cysts. No biliary dilatation. No enhancing mass. The gallbladder is contracted without stones. PANCREAS: Normal. SPLEEN: Normal. ADRENALS/URINARY TRACT: Kidneys are orthotopic, demonstrating symmetric enhancement. No nephrolithiasis, hydronephrosis or solid renal masses. Urinary bladder is partially distended and unremarkable. Normal adrenal glands. STOMACH/BOWEL: The stomach, small and large bowel are normal in course and caliber without inflammatory changes. Normal appendix. VASCULAR/LYMPHATIC: Aortoiliac vessels are normal in course and caliber. No lymphadenopathy by CT size criteria. REPRODUCTIVE: Right adnexal heterogeneous masslike abnormality 5.9 x 3.5 x 4 cm likely representing the right ovary. Scattered areas of hypodensity may represent small follicles or cysts within. The possibility of torsion is not excluded and should be correlated clinically and if necessary by ultrasound. The left ovary is unremarkable. Status post hysterectomy and bilateral salpingectomy by report in 2016. OTHER: No intraperitoneal free fluid or free air. MUSCULOSKELETAL: Degenerative disc disease L5-S1. IMPRESSION: 1. Heterogeneous, enlarged appearing right  adnexal mass likely representing the right ovary measuring 5.9 x 3.5 x 4 cm. Correlation with ultrasound is recommended for further characterization and to exclude the possibility of torsion accounting for its enlargement. The left ovary is normal appearance. The patient is status post hysterectomy. 2. Multiple subcentimeter low-density lesions throughout the liver consistent small cysts or biliary hamartomas. Electronically Signed   By: Ashley Royalty M.D.   On: 08/28/2018 03:54   [place] Art/ven Flow Abd Pelv Doppler  Result Date: 08/28/2018 CLINICAL DATA:  Right adnexal mass EXAM: TRANSABDOMINAL AND TRANSVAGINAL ULTRASOUND OF PELVIS DOPPLER ULTRASOUND OF OVARIES TECHNIQUE: Both transabdominal and transvaginal ultrasound examinations of the pelvis were performed. Transabdominal technique was performed for global imaging of the pelvis including uterus, ovaries, adnexal regions, and pelvic cul-de-sac. It was necessary to proceed with endovaginal exam following the transabdominal exam to visualize the ovaries. Color and duplex Doppler ultrasound was utilized to evaluate blood flow to the ovaries. COMPARISON:  Abdominal CT from earlier today FINDINGS: Uterus Surgically absent Right ovary Measurements: 6 x 7 x 4 cm = volume: 90 mL. The ovary has a heterogeneous appearance without discrete mass. Follicles are seen peripherally. Small volume fluid about the right ovary with particulate like echoes. Left ovary Not visualized but normal size on prior abdominal CT Pulsed Doppler evaluation of both visible right ovary demonstrates normal low-resistance arterial and venous waveforms. IMPRESSION: Only the enlarged right ovary was visualized sonographically and normal ovarian blood flow is present. The ovary is significantly enlarged without discrete underlying mass lesion. Torsion/detorsion is a consideration. There could also be an occult underlying hemorrhagic cyst given there is some small volume particulate pelvic fluid about  the right adnexa. A neoplasm is not favored given the CT appearance on 12/29/2017 abdominal CT, but need short term follow-up ultrasound to document normalization. Electronically Signed   By: Monte Fantasia M.D.   On: 08/28/2018 06:08    Assessment/Plan: Hemorhagic Ovary on right,  Rule out incomplete torsion  Counselled over requested removal of right ovary( and tube if present.) NPO To OR as add on this early afternoon. Jonnie Kind 08/28/2018

## 2018-08-28 NOTE — Op Note (Signed)
Please see the brief operative note for surgical details 

## 2018-08-28 NOTE — Anesthesia Preprocedure Evaluation (Addendum)
Anesthesia Evaluation  Patient identified by MRN, date of birth, ID band Patient awake    Reviewed: Allergy & Precautions, NPO status , Patient's Chart, lab work & pertinent test results  Airway Mallampati: II  TM Distance: >3 FB Neck ROM: Full    Dental no notable dental hx. (+) Teeth Intact   Pulmonary neg pulmonary ROS,    Pulmonary exam normal breath sounds clear to auscultation       Cardiovascular Exercise Tolerance: Good negative cardio ROS Normal cardiovascular examI Rhythm:Regular Rate:Normal     Neuro/Psych  Headaches, negative psych ROS   GI/Hepatic negative GI ROS, Neg liver ROS,   Endo/Other  negative endocrine ROS  Renal/GU negative Renal ROS  negative genitourinary   Musculoskeletal negative musculoskeletal ROS (+)   Abdominal   Peds negative pediatric ROS (+)  Hematology negative hematology ROS (+)   Anesthesia Other Findings   Reproductive/Obstetrics negative OB ROS                             Anesthesia Physical Anesthesia Plan  ASA: II  Anesthesia Plan: General   Post-op Pain Management:    Induction: Intravenous  PONV Risk Score and Plan:   Airway Management Planned: Oral ETT  Additional Equipment:   Intra-op Plan:   Post-operative Plan: Extubation in OR  Informed Consent: I have reviewed the patients History and Physical, chart, labs and discussed the procedure including the risks, benefits and alternatives for the proposed anesthesia with the patient or authorized representative who has indicated his/her understanding and acceptance.     Dental advisory given  Plan Discussed with: CRNA  Anesthesia Plan Comments:         Anesthesia Quick Evaluation

## 2018-08-28 NOTE — Discharge Instructions (Signed)
Diagnostic Laparoscopy Diagnostic laparoscopy is a procedure to diagnose diseases in the abdomen. It might be done for a variety of reasons, such as to look for scar tissue, cancer, or a reason for abdomen (abdominal) pain. During the procedure, a thin, flexible tube that has a light and a camera on the end (laparoscope) is inserted through an incision in the abdomen. The image from the camera is shown on a monitor to help your surgeon see inside your body. Tell a health care provider about:  Any allergies you have.  All medicines you are taking, including vitamins, herbs, eye drops, creams, and over-the-counter medicines.  Any problems you or family members have had with anesthetic medicines.  Any blood disorders you have.  Any surgeries you have had.  Any medical conditions you have. What are the risks? Generally, this is a safe procedure. However, problems may occur, including:  Infection.  Bleeding.  Allergic reactions to medicines or dyes.  Damage to abdominal structures or organs, such as the intestines, liver, stomach, or spleen. What happens before the procedure? Medicines  Ask your health care provider about: ? Changing or stopping your regular medicines. This is especially important if you are taking diabetes medicines or blood thinners. ? Taking medicines such as aspirin and ibuprofen. These medicines can thin your blood. Do not take these medicines unless your health care provider tells you to take them. ? Taking over-the-counter medicines, vitamins, herbs, and supplements.  You may be given antibiotic medicine to help prevent infection. Staying hydrated Follow instructions from your health care provider about hydration, which may include:  Up to 2 hours before the procedure - you may continue to drink clear liquids, such as water, clear fruit juice, black coffee, and plain tea. Eating and drinking restrictions Follow instructions from your health care provider  about eating and drinking, which may include:  8 hours before the procedure - stop eating heavy meals or foods such as meat, fried foods, or fatty foods.  6 hours before the procedure - stop eating light meals or foods, such as toast or cereal.  6 hours before the procedure - stop drinking milk or drinks that contain milk.  2 hours before the procedure - stop drinking clear liquids. General instructions  Ask your health care provider how your surgical site will be marked or identified.  You may be asked to shower with a germ-killing soap.  Plan to have someone take you home from the hospital or clinic.  Plan to have a responsible adult care for you for at least 24 hours after you leave the hospital or clinic. This is important. What happens during the procedure?   To lower your risk of infection: ? Your health care team will wash or sanitize their hands. ? Hair may be removed from the surgical area. ? Your skin will be washed with soap.  An IV will be inserted into one of your veins.  You will be given a medicine to make you fall asleep (general anesthetic). You may also be given a medicine to help you relax (sedative).  A breathing tube will be placed down your throat to help you breathe during the procedure.  Your abdomen will be filled with an air-like gas so it expands. This will give the surgeon more room to operate and will make your organs easier to see.  Many small incisions will be made in your abdomen.  A laparoscope and other surgical instruments will be inserted into your abdomen through the  incisions.  A tissue sample may be removed from an organ for examination (biopsy). This will depend on the reason why you are having this procedure.  The laparoscope and other instruments will be removed from your abdomen.  The gas will be released.  Your incisions will be closed with stitches (sutures) and covered with a bandage (dressing).  Your breathing tube will be  removed. The procedure may vary among health care providers and hospitals. What happens after the procedure?   Your blood pressure, heart rate, breathing rate, and blood oxygen level will be monitored until the medicines you were given have worn off.  Do not drive for 24 hours if you were given a sedative during your procedure.  It is up to you to get the results of your procedure. Ask your health care provider, or the department that is doing the procedure, when your results will be ready. Summary  Diagnostic laparoscopy is a way to look for problems in the abdomen using small incisions.  Follow instructions from your health care provider about how to prepare for the procedure.  Plan to have a responsible adult care for you for at least 24 hours after you leave the hospital or clinic. This is important. This information is not intended to replace advice given to you by your health care provider. Make sure you discuss any questions you have with your health care provider. Document Released: 09/16/2000 Document Revised: 05/08/2017 Document Reviewed: 12/04/2016 Elsevier Interactive Patient Education  2019 [employer]  Unilateral Salpingo-Oophorectomy, Care After This sheet gives you information about how to care for yourself after your procedure. Your health care provider may also give you more specific instructions. If you have problems or questions, contact your health care provider. What can I expect after the procedure? After the procedure, it is common to have:  Abdominal pain.  Some occasional vaginal bleeding (spotting).  Tiredness. Follow these instructions at home: Incision care   Keep your incision area and your bandage (dressing) clean and dry.  Follow instructions from your health care provider about how to take care of your incision. Make sure you: ? Wash your hands with soap and water before you change your dressing. If soap and water are not available, use hand  sanitizer. ? Change your dressing as told by your health care provider. ? Leave stitches (sutures), staples, skin glue, or adhesive strips in place. These skin closures may need to stay in place for 2 weeks or longer. If adhesive strip edges start to loosen and curl up, you may trim the loose edges. Do not remove adhesive strips completely unless your health care provider tells you to do that.  Check your incision area every day for signs of infection. Check for: ? Redness, swelling, or pain. ? Fluid or blood. ? Warmth. ? Pus or a bad smell. Activity  Do not drive or use heavy machinery while taking prescription pain medicine.  Do not drive for 24 hours if you received a medicine to help you relax (sedative).  Take frequent, short walks throughout the day. Rest when you get tired. Ask your health care provider what activities are safe for you.  Avoid activities that require great effort. Also, avoid heavy lifting. Do not lift anything that is heavier than 5 lb (2.3 kg), or the limit that your health care provider tells you, until he or she says that it is safe to do so.  Do not douche, use tampons, or have sex until your health care  provider approves. General instructions  To prevent or treat constipation while you are taking prescription pain medicine, your health care provider may recommend that you: ? Drink enough fluid to keep your urine pale yellow. ? Take over-the-counter or prescription medicines. ? Eat foods that are high in fiber, such as fresh fruits and vegetables, whole grains, and beans. ? Limit foods that are high in fat and processed sugars, such as fried and sweet foods.  Take over-the-counter and prescription medicines only as told by your health care provider.  Do not take baths, swim, or use a hot tub until your health care provider approves. Ask your health care provider if you may take showers. You may only be allowed to take sponge baths.  Wear compression  stockings as told by your health care provider. These stockings help to prevent blood clots and reduce swelling in your legs.  Keep all follow-up visits as told by your health care provider. This is important. Contact a health care provider if:  You have pain when you urinate.  You have pus or a bad smelling discharge coming from your vagina.  You have redness, swelling, or pain around your incision.  You have fluid or blood coming from your incision.  Your incision feels warm to the touch.  You have pus or a bad smell coming from your incision.  You have a fever.  Your incision starts to break open.  You have abdominal pain that gets worse or does not get better with medicine.  You develop a rash.  You develop nausea and vomiting.  You feel lightheaded. Get help right away if:  You develop pain in your chest or leg.  You develop shortness of breath.  You faint.  You have increased bleeding from your vagina. Summary  After the procedure, it is common to have pain, tiredness, and occasional bleeding from the vagina.  Follow instructions from your health care provider about how to take care of your incision.  Check your incision every day for signs of infection and report any symptoms to your health care provider.  Follow instructions from your health care provider about activities and restrictions. This information is not intended to replace advice given to you by your health care provider. Make sure you discuss any questions you have with your health care provider. Document Released: 04/06/2009 Document Revised: 09/19/2016 Document Reviewed: 09/19/2016 Elsevier Interactive Patient Education  2019 [employer].

## 2018-08-28 NOTE — Anesthesia Postprocedure Evaluation (Signed)
Anesthesia Post Note  Patient: Maria Velasquez  Procedure(s) Performed: LAPAROSCOPIC OOPHORECTOMY (Right Abdomen)  Patient location during evaluation: PACU Anesthesia Type: General Level of consciousness: awake and alert and patient cooperative Pain management: satisfactory to patient Vital Signs Assessment: post-procedure vital signs reviewed and stable Respiratory status: spontaneous breathing Cardiovascular status: stable Postop Assessment: no apparent nausea or vomiting Anesthetic complications: no     Last Vitals:  Vitals:   08/28/18 1550 08/28/18 1552  BP: 109/63 100/65  Pulse: 77 85  Resp: 16 20  Temp:    SpO2:      Last Pain:  Vitals:   08/28/18 1550  TempSrc:   PainSc: 3                  Woodrow Drab

## 2018-08-28 NOTE — Brief Op Note (Signed)
08/28/2018  3:15 PM  PATIENT:  Maria Velasquez  43 y.o. female  PRE-OPERATIVE DIAGNOSIS:  right ovarian cyst, r/o torsion  POST-OPERATIVE DIAGNOSIS:  right ovarian torsion  PROCEDURE:  Procedure(s): LAPAROSCOPIC OOPHORECTOMY (Right)  SURGEON:  Surgeon(s) and Role:    Maria Kind, MD - Primary  PHYSICIAN ASSISTANT:   ASSISTANTS: none   ANESTHESIA:   local and general  EBL:  100 mL, old blood, minimal acute blood loss  BLOOD ADMINISTERED:none  DRAINS: none   LOCAL MEDICATIONS USED:  MARCAINE    and Amount: 18 ml  SPECIMEN:  Source of Specimen:  Right ovary  DISPOSITION OF SPECIMEN:  PATHOLOGY  COUNTS:  YES  TOURNIQUET:  * No tourniquets in log *  DICTATION: .Dragon Dictation  PLAN OF CARE: Discharge to home after PACU  PATIENT DISPOSITION:  PACU - hemodynamically stable.   Delay start of Pharmacological VTE agent (>24hrs) due to surgical blood loss or risk of bleeding: not applicable Details of procedure: Patient was taken the operating room prepped and draped for abdominal procedure with legs in low lithotomy support, with Foley catheter in place and timeout conducted procedure confirmed by surgical team.  Ancef was administered 2 g intravenous.  Attention was then directed to the umbilicus where a infraumbilical vertical 1 cm skin incision was made with hemostat dissection down the fascia.  Suprapubic transverse 2 cm skin incision was made and right lower quadrant incision was made along the prior robotic hysterectomy incision line in the right lower quadrant.  Veress needle was introduced through the umbilicus while orienting the needle toward the pelvis elevating the abdominal wall and loss-of-resistance technique followed by water droplet technique confirmed intraperitoneal location.  The pneumoperitoneum was achieved under 6 mm intra-abdominal pressure, and then the laparoscopic trocar introduced again orienting toward the pelvis without difficulty.  There was  small amount of old blood visible on the surface of the bowel origin dating from the pelvic structures.  The bowel itself appeared normal.  Photo was taken to document the initial appearance.  Patient was placed in Trendelenburg position the bowel moved away and the right ovary was confirmed to being torsed with the visible loop of twisted tissue right at the pelvic sidewall.  The ureter could be visualized coursing just beneath the IP ligament remnants in the torsion area.  Photodocumentation of the ureter was performed as well.  The ovary was then on kinked and photos taken again (#6) it was apparent that the ovary could be removed and the ureter traced well past the pedicle.  Using the harmonic a 7 through the right lower quadrant trocar site we then performed oophorectomy and placed the ovary in the pelvis.  The left ovary was inspected and was found to be adherent to the sidewall sufficiently that potential for torsion was considered minimal.  Endo Catch bag was placed through the suprapubic trocar site and the specimen scooped and brought to the suprapubic site and extracted through the fascial opening.  A Kelly clamp was used to spread the fascia slightly to allow removal of the specimen inside the Endo Catch bag. Pelvis had been irrigated out and pedicles were inspected and hemostasis confirmed.  The epiploic fat was stuck to the left ovary slightly and this was mobilized and freed up.  The ovary was stuck to the left pelvic sidewall right at the pelvic brim and it was felt unwise to try to mobilize it. Photodocumentation of anatomy was taken. Abdomen was deflated and pedicles inspected  again and confirmed as hemostatic, the trochars were removed, with approximately 75 cc of saline left in the abdomen assist with evacuation of pneumoperitoneum.  She was grasped with Allis clamps while using S retractors to allow excellent visualization of the fascial edges.  The suprapubic and umbilical sites were closed  at the fascia with 0 Vicryl.  All 3 sites were closed at the skin level with subcuticular 4-0 Vicryl. Steri-Strips were applied and pressure dressing applied to the umbilicus and suprapubic site Marcaine was injected at the fascial levels prior to sit skin closure at the all 3 sites. Patient went to recovery room in stable condition will receive Toradol intravenous for pain, has been confirmed that she can take Vicodin.  The patient will be assessed in recovery with plans to send her home today unless pain assessments in recovery room require observation overnight.  Sponge and needle counts were correct, of the radiograph wand was used at the end of the procedure to confirm no evidence of intraperitoneal devices.  Sponge and needle counts were correct

## 2018-08-30 NOTE — Discharge Summary (Signed)
Physician Discharge Summary  Patient ID: Maria Velasquez MRN: [id number] DOB/AGE: 01-26-1976 43 y.o.  Admit date: 08/28/2018 Discharge date: 3/6  Admission Diagnoses:left ovarian cyst , pelvic pain, rule out torsion  Discharge Diagnoses:  Left ovarian torsion  Discharged Condition: good  Hospital Course:  Reason for Consult:severely painful right ovarian cyst, r/o torsion Referring Physician: ED physician  Maria Velasquez is an 43 y.o. female. She is s/p laparoscopic hysterectomy, by Dr Henderson Cloud, and she is admitted from the ED for Laparoscopic removal of right ovarian cyst that is enlarged, acutely painful since 8 pm last night, with CT confirmation of ovarian enlargement and u/s measurements that show some internal flow, making complete torsion unlikely, but partial or incomplete torsion cannot be ruled out. The patient received IV dilaudid, with transient reduced pain, but finds the pain levels excessive for outpatient monitoring and desires excision.  The opposite ovary is unremarkable. The patient is counseled over surgery, including specific discussion of risks of complications such as inability to perform the procedure laparoscopically, requiring laparotomy, the possibility of adhesions, or other complications involving adjacent organs.    Pertinent Gynecological History: Menses:  Bleeding:  Contraception: status post hysterectomy DES exposure: unknown Blood transfusions: none Sexually transmitted diseases: no past history Previous GYN Procedures: laparoscopic hysterectomy dr Henderson Cloud. 2016 Last mammogram: normal Date: 04/2018 Last pap: normal Date:  OB History: G, P           Menstrual History: Menarche age:  Patient's last menstrual period was 10/19/2014 (approximate).      Past Medical History:  Diagnosis Date  . Headache    otc med prn  . SVD (spontaneous vaginal delivery)    x 1         Past Surgical History:  Procedure Laterality Date  . BACK  SURGERY  01/30/2007   L4-L5, L5-S1 herniated disk  . DILATION AND CURETTAGE OF UTERUS  08/05/2009   missed abortion  . ROBOTIC ASSISTED BILATERAL SALPINGO OOPHERECTOMY Bilateral 11/16/2014   Procedure: ROBOTIC ASSISTED BILATERAL SALPINGECTOMY;  Surgeon: Carrington Clamp, MD;  Location: [facility];  Service: Gynecology;  Laterality: Bilateral;  . ROBOTIC ASSISTED TOTAL HYSTERECTOMY N/A 11/16/2014   Procedure: ROBOTIC ASSISTED TOTAL HYSTERECTOMY;  Surgeon: Carrington Clamp, MD;  Location: [facility];  Service: Gynecology;  Laterality: N/A;  TO FOLLOW FIRDT ROBOTIC CASE. tIME NEEDS TO BE REDUCED TO 3 HOURS.  . TUBAL LIGATION  03/20/2010   filshie clips  . WISDOM TOOTH EXTRACTION           Family History  Problem Relation Age of Onset  . Breast cancer Mother     Social History:  reports that she has never smoked. She has never used smokeless tobacco. She reports current alcohol use. She reports that she does not use drugs.  Allergies:       Allergies  Allergen Reactions  . Oxycodone Itching and Other (See Comments)    Causes the sensation of skin crawling.    Medications: I have reviewed the patient's current medications.  ROS  Blood pressure 115/70, pulse 77, temperature 97.6 F (36.4 C), temperature source Oral, resp. rate 17, height 5\' 3"  (1.6 m), weight 75.8 kg, last menstrual period 10/19/2014, SpO2 100 %. Physical Exam  Constitutional: She is oriented to person, place, and time. She appears well-developed and well-nourished. She appears distressed.  HENT:  Head: Normocephalic and atraumatic.  Eyes: Pupils are equal, round, and reactive to light.  Neck: Neck supple. No tracheal deviation present. No thyromegaly present.  Cardiovascular:  Normal rate.  Respiratory: Effort normal.  GI: Soft. There is abdominal tenderness. There is no rebound.  Rlq tenderness to palpation  Genitourinary:    Vagina normal.   Musculoskeletal: Normal range of motion.  Neurological:  She is alert and oriented to person, place, and time.  Skin: Skin is warm and dry.  Psychiatric: She has a normal mood and affect. Her behavior is normal. Judgment and thought content normal.    LabResultsLast48Hours        Results for orders placed or performed during the hospital encounter of 08/28/18 (from the past 48 hour(s))  CBC with Differential/Platelet     Status: Abnormal   Collection Time: 08/28/18  1:54 AM  Result Value Ref Range   WBC 17.3 (H) 4.0 - 10.5 K/uL   RBC 4.94 3.87 - 5.11 MIL/uL   Hemoglobin 14.5 12.0 - 15.0 g/dL   HCT 41.3 24.4 - 01.0 %   MCV 88.7 80.0 - 100.0 fL   MCH 29.4 26.0 - 34.0 pg   MCHC 33.1 30.0 - 36.0 g/dL   RDW 27.2 53.6 - 64.4 %   Platelets 321 150 - 400 K/uL   nRBC 0.0 0.0 - 0.2 %   Neutrophils Relative % 78 %   Neutro Abs 13.5 (H) 1.7 - 7.7 K/uL   Lymphocytes Relative 17 %   Lymphs Abs 2.9 0.7 - 4.0 K/uL   Monocytes Relative 4 %   Monocytes Absolute 0.8 0.1 - 1.0 K/uL   Eosinophils Relative 1 %   Eosinophils Absolute 0.1 0.0 - 0.5 K/uL   Basophils Relative 0 %   Basophils Absolute 0.0 0.0 - 0.1 K/uL   Immature Granulocytes 0 %   Abs Immature Granulocytes 0.06 0.00 - 0.07 K/uL    Comment: Performed at [facility], [address], [place], [place] [address]  Comprehensive metabolic panel     Status: Abnormal   Collection Time: 08/28/18  1:54 AM  Result Value Ref Range   Sodium 136 135 - 145 mmol/L   Potassium 3.3 (L) 3.5 - 5.1 mmol/L   Chloride 105 98 - 111 mmol/L   CO2 22 22 - 32 mmol/L   Glucose, Bld 118 (H) 70 - 99 mg/dL   BUN 11 6 - 20 mg/dL   Creatinine, Ser 2.59 0.44 - 1.00 mg/dL   Calcium 9.4 8.9 - 56.3 mg/dL   Total Protein 7.1 6.5 - 8.1 g/dL   Albumin 4.2 3.5 - 5.0 g/dL   AST 18 15 - 41 U/L   ALT 14 0 - 44 U/L   Alkaline Phosphatase 78 38 - 126 U/L   Total Bilirubin 0.6 0.3 - 1.2 mg/dL   GFR calc non Af Amer >60 >60 mL/min   GFR calc Af Amer >60 >60 mL/min   Anion gap 9  5 - 15    Comment: Performed at [facility], [address] [place], [place] [address]  Lipase, blood     Status: None   Collection Time: 08/28/18  1:54 AM  Result Value Ref Range   Lipase 29 11 - 51 U/L    Comment: Performed at [facility], [address] [place], [place] [address]  Urinalysis, Routine w reflex microscopic     Status: None   Collection Time: 08/28/18  3:27 AM  Result Value Ref Range   Color, Urine YELLOW YELLOW   APPearance CLEAR CLEAR   Specific Gravity, Urine 1.009 1.005 - 1.030   pH 6.0 5.0 - 8.0   Glucose, UA  NEGATIVE NEGATIVE mg/dL   Hgb urine dipstick NEGATIVE NEGATIVE   Bilirubin Urine NEGATIVE NEGATIVE   Ketones, ur NEGATIVE NEGATIVE mg/dL   Protein, ur NEGATIVE NEGATIVE mg/dL   Nitrite NEGATIVE NEGATIVE   Leukocytes,Ua NEGATIVE NEGATIVE    Comment: Performed at [facility], [address], [place], [place] [address]  Pregnancy, urine     Status: None   Collection Time: 08/28/18  3:27 AM  Result Value Ref Range   Preg Test, Ur NEGATIVE NEGATIVE    Comment:        THE SENSITIVITY OF THIS METHODOLOGY IS >20 mIU/mL. Performed at [facility], [address], [place], [place] [address]        ImagingResults(Last48hours)  US Transvaginal Non-ob  Result Date: 08/28/2018 CLINICAL DATA:  Right adnexal mass EXAM: TRANSABDOMINAL AND TRANSVAGINAL ULTRASOUND OF PELVIS DOPPLER ULTRASOUND OF OVARIES TECHNIQUE: Both transabdominal and transvaginal ultrasound examinations of the pelvis were performed. Transabdominal technique was performed for global imaging of the pelvis including uterus, ovaries, adnexal regions, and pelvic cul-de-sac. It was necessary to proceed with endovaginal exam following the transabdominal exam to visualize the ovaries. Color and duplex Doppler ultrasound was utilized to evaluate blood flow to the ovaries. COMPARISON:  Abdominal CT from earlier today FINDINGS: Uterus Surgically absent Right ovary Measurements: 6  x 7 x 4 cm = volume: 90 mL. The ovary has a heterogeneous appearance without discrete mass. Follicles are seen peripherally. Small volume fluid about the right ovary with particulate like echoes. Left ovary Not visualized but normal size on prior abdominal CT Pulsed Doppler evaluation of both visible right ovary demonstrates normal low-resistance arterial and venous waveforms. IMPRESSION: Only the enlarged right ovary was visualized sonographically and normal ovarian blood flow is present. The ovary is significantly enlarged without discrete underlying mass lesion. Torsion/detorsion is a consideration. There could also be an occult underlying hemorrhagic cyst given there is some small volume particulate pelvic fluid about the right adnexa. A neoplasm is not favored given the CT appearance on 12/29/2017 abdominal CT, but need short term follow-up ultrasound to document normalization. Electronically Signed   By: Marnee Spring M.D.   On: 08/28/2018 06:08   US Pelvis Complete  Result Date: 08/28/2018 CLINICAL DATA:  Right adnexal mass EXAM: TRANSABDOMINAL AND TRANSVAGINAL ULTRASOUND OF PELVIS DOPPLER ULTRASOUND OF OVARIES TECHNIQUE: Both transabdominal and transvaginal ultrasound examinations of the pelvis were performed. Transabdominal technique was performed for global imaging of the pelvis including uterus, ovaries, adnexal regions, and pelvic cul-de-sac. It was necessary to proceed with endovaginal exam following the transabdominal exam to visualize the ovaries. Color and duplex Doppler ultrasound was utilized to evaluate blood flow to the ovaries. COMPARISON:  Abdominal CT from earlier today FINDINGS: Uterus Surgically absent Right ovary Measurements: 6 x 7 x 4 cm = volume: 90 mL. The ovary has a heterogeneous appearance without discrete mass. Follicles are seen peripherally. Small volume fluid about the right ovary with particulate like echoes. Left ovary Not visualized but normal size on prior abdominal CT  Pulsed Doppler evaluation of both visible right ovary demonstrates normal low-resistance arterial and venous waveforms. IMPRESSION: Only the enlarged right ovary was visualized sonographically and normal ovarian blood flow is present. The ovary is significantly enlarged without discrete underlying mass lesion. Torsion/detorsion is a consideration. There could also be an occult underlying hemorrhagic cyst given there is some small volume particulate pelvic fluid about the right adnexa. A neoplasm is not favored given the CT appearance on 12/29/2017 abdominal CT, but need short term  follow-up ultrasound to document normalization. Electronically Signed   By: Marnee Spring M.D.   On: 08/28/2018 06:08   Ct Abdomen Pelvis W Contrast  Result Date: 08/28/2018 CLINICAL DATA:  Lower abdominal pain onset 2030 hours. EXAM: CT ABDOMEN AND PELVIS WITH CONTRAST TECHNIQUE: Multidetector CT imaging of the abdomen and pelvis was performed using the standard protocol following bolus administration of intravenous contrast. CONTRAST:  OMNIPAQUE IOHEXOL 300 MG/ML  SOLN COMPARISON:  01/08/2018 FINDINGS: LOWER CHEST: Lung bases are clear. Included heart size is normal. No pericardial effusion. HEPATOBILIARY: Tiny too small to characterize hypodensities within the liver statistically consistent with cysts. No biliary dilatation. No enhancing mass. The gallbladder is contracted without stones. PANCREAS: Normal. SPLEEN: Normal. ADRENALS/URINARY TRACT: Kidneys are orthotopic, demonstrating symmetric enhancement. No nephrolithiasis, hydronephrosis or solid renal masses. Urinary bladder is partially distended and unremarkable. Normal adrenal glands. STOMACH/BOWEL: The stomach, small and large bowel are normal in course and caliber without inflammatory changes. Normal appendix. VASCULAR/LYMPHATIC: Aortoiliac vessels are normal in course and caliber. No lymphadenopathy by CT size criteria. REPRODUCTIVE: Right adnexal heterogeneous  masslike abnormality 5.9 x 3.5 x 4 cm likely representing the right ovary. Scattered areas of hypodensity may represent small follicles or cysts within. The possibility of torsion is not excluded and should be correlated clinically and if necessary by ultrasound. The left ovary is unremarkable. Status post hysterectomy and bilateral salpingectomy by report in 2016. OTHER: No intraperitoneal free fluid or free air. MUSCULOSKELETAL: Degenerative disc disease L5-S1. IMPRESSION: 1. Heterogeneous, enlarged appearing right adnexal mass likely representing the right ovary measuring 5.9 x 3.5 x 4 cm. Correlation with ultrasound is recommended for further characterization and to exclude the possibility of torsion accounting for its enlargement. The left ovary is normal appearance. The patient is status post hysterectomy. 2. Multiple subcentimeter low-density lesions throughout the liver consistent small cysts or biliary hamartomas. Electronically Signed   By: Tollie Eth M.D.   On: 08/28/2018 03:54   [place] Art/ven Flow Abd Pelv Doppler  Result Date: 08/28/2018 CLINICAL DATA:  Right adnexal mass EXAM: TRANSABDOMINAL AND TRANSVAGINAL ULTRASOUND OF PELVIS DOPPLER ULTRASOUND OF OVARIES TECHNIQUE: Both transabdominal and transvaginal ultrasound examinations of the pelvis were performed. Transabdominal technique was performed for global imaging of the pelvis including uterus, ovaries, adnexal regions, and pelvic cul-de-sac. It was necessary to proceed with endovaginal exam following the transabdominal exam to visualize the ovaries. Color and duplex Doppler ultrasound was utilized to evaluate blood flow to the ovaries. COMPARISON:  Abdominal CT from earlier today FINDINGS: Uterus Surgically absent Right ovary Measurements: 6 x 7 x 4 cm = volume: 90 mL. The ovary has a heterogeneous appearance without discrete mass. Follicles are seen peripherally. Small volume fluid about the right ovary with particulate like echoes. Left ovary  Not visualized but normal size on prior abdominal CT Pulsed Doppler evaluation of both visible right ovary demonstrates normal low-resistance arterial and venous waveforms. IMPRESSION: Only the enlarged right ovary was visualized sonographically and normal ovarian blood flow is present. The ovary is significantly enlarged without discrete underlying mass lesion. Torsion/detorsion is a consideration. There could also be an occult underlying hemorrhagic cyst given there is some small volume particulate pelvic fluid about the right adnexa. A neoplasm is not favored given the CT appearance on 12/29/2017 abdominal CT, but need short term follow-up ultrasound to document normalization. Electronically Signed   By: Marnee Spring M.D.   On: 08/28/2018 06:08     Assessment/Plan: Hemorhagic Ovary on right,  Rule out incomplete  torsion  Counselled over requested removal of right ovary( and tube if present.) NPO To OR as add on this early afternoon. Maria Velasquez 08/28/2018          Electronically signed by Maria Burrow, MD at 08/28/2018 8:40 AM    Consults: gynecology  Significant Diagnostic Studies: see above HPI  Treatments: surgery: Laparoscopic right oophorectomy  Discharge Exam: Blood pressure 100/65, pulse 85, temperature 98.7 F (37.1 C), resp. rate 20, height 5\' 3"  (1.6 m), weight 75.8 kg, last menstrual period 10/19/2014, SpO2 100 %. General appearance: alert, cooperative and no distress Head: Normocephalic, without obvious abnormality, atraumatic GI: soft, non-tender; bowel sounds normal; no masses,  no organomegaly and stable postop Extremities: extremities normal, atraumatic, no cyanosis or edema and Homans sign is negative, no sign of DVT  Disposition: home  Discharge Instructions    Call MD for:  severe uncontrolled pain   Complete by:  As directed    Diet - low sodium heart healthy   Complete by:  As directed    Diet - low sodium heart healthy   Complete by:  As  directed    Increase activity slowly   Complete by:  As directed    Increase activity slowly   Complete by:  As directed      Allergies as of 08/28/2018      Reactions   Oxycodone Itching, Other (See Comments)   Causes the sensation of skin crawling.   Oxycodone-acetaminophen Hives   Prednisone Other (See Comments)   Headaches and nausea      Medication List    TAKE these medications   ALPRAZolam 0.5 MG tablet Commonly known as:  XANAX Take 0.5 mg by mouth daily as needed. for anxiety   HYDROcodone-acetaminophen 5-325 MG tablet Commonly known as:  NORCO/VICODIN Take 1-2 tablets by mouth every 4 (four) hours as needed for moderate pain.   HYDROcodone-acetaminophen 5-325 MG tablet Commonly known as:  NORCO/VICODIN Take 1-2 tablets by mouth every 6 (six) hours as needed.   ibuprofen 200 MG tablet Commonly known as:  ADVIL,MOTRIN Take 200-800 mg by mouth as needed for headache or mild pain. Only needs about twice a week   phentermine 37.5 MG tablet Commonly known as:  ADIPEX-P Take 37.5 mg by mouth daily.   tiZANidine 4 MG tablet Commonly known as:  ZANAFLEX Take 2 mg by mouth as needed.      Follow-up Information    Maria Burrow, MD In 1 week.   Specialties:  Obstetrics and Gynecology, Radiology Why:  Postoperative visit Contact information: [address] [place] [address] [phone]           Signed: Tilda Velasquez 08/30/2018, 2:30 PM

## 2018-08-31 ENCOUNTER — Telehealth: Payer: Self-pay | Admitting: Obstetrics and Gynecology

## 2018-08-31 NOTE — OR Nursing (Signed)
Patient requests that we send a note for work to her place of employment.                                          Maria Velasquez was at [facility] where she had surgery done on 08/28/2018. Dr Glo Herring is to see her on Monday 09/04/18 for follow up unless she is feeling better. She is instructed to call [employer] if she wants to return to work before 09/04/2018 and they will see her to determine if that is possible.           If you have any questions please call us at [phone] or contact Dr Mallory Shirk at [phone].                                             Thank You,                                                                                                  Purcell Mouton.

## 2018-08-31 NOTE — Telephone Encounter (Signed)
Phone call to patient; she reports feeling shoulder pain initially , requiring analgesics, otherwise she's doing well. Considering return to work later this week.  Pt is to call office for appt early this week , and can get release to Return To Work at that time.

## 2018-09-02 ENCOUNTER — Other Ambulatory Visit: Payer: Self-pay

## 2018-09-02 ENCOUNTER — Encounter: Payer: Self-pay | Admitting: Obstetrics and Gynecology

## 2018-09-02 ENCOUNTER — Ambulatory Visit (INDEPENDENT_AMBULATORY_CARE_PROVIDER_SITE_OTHER): Payer: Commercial Managed Care - PPO | Admitting: Obstetrics and Gynecology

## 2018-09-02 VITALS — BP 132/87 | HR 80 | Ht 63.0 in | Wt 171.0 lb

## 2018-09-02 DIAGNOSIS — Z9889 Other specified postprocedural states: Secondary | ICD-10-CM

## 2018-09-02 DIAGNOSIS — Z09 Encounter for follow-up examination after completed treatment for conditions other than malignant neoplasm: Secondary | ICD-10-CM

## 2018-09-02 NOTE — Progress Notes (Signed)
Patient ID: Maria Velasquez, female   DOB: December 08, 1975, 43 y.o.   MRN: [id number]  Subjective:  Maria Velasquez is a 43 y.o. female now 5 days status post right laparoscopic oophorectomy.   Is sore and feels pressure at incision site but is doing well. Pain is at 2/10. Is not taking any hydrocodone since Sunday and is not taking any ibuprofen but needs something to get through the day. Was unsure if she could ibuprofen with hydrocodone in her system.   Says she she broke out in a cold sweat Sunday night and at present time of appt face feels flustered. Is unsure but has suspicion of hot flashes  Review of Systems Negative except tender and soreness at incision site   Diet:   normal   Bowel movements : normal.  Pain is controlled without any medications.  Objective:  BP 132/87 (BP Location: Right Arm, Patient Position: Sitting, Cuff Size: Normal)   Pulse 80   Ht 5\' 3"  (1.6 m)   Wt 171 lb (77.6 kg)   LMP 10/19/2014 (Approximate)   BMI 30.29 kg/m  General:Well developed, well nourished.  No acute distress. Abdomen: Bowel sounds normal, soft, non-tender. Pelvic Exam: Not done  Incision(s): Healing well, no drainage, no erythema, no hernia, no swelling, no dehiscence,  Assessment:  Post-Op 5 days s/p right laparoscopic oophorectomy   Doing well postoperatively.   Plan:  1.Wound care discussed : dissolvable stitches 2. . current medications.Ibuprofen 3. Activity restrictions: No lifting if having to strain when breathing 4. return to work: work [employer]ar[employer] provided. 5. Follow up in 2 weeks with Dr Philis Pique, pt has appt.Marland Kitchen Keep scheduled appt in April  6. Discussed hormonal treatments; Vivelle Dot vs pill options if vasomotor sx persist/worsen  By signing my name below, I, Samul Dada, attest that this documentation has been prepared under the direction and in the presence of Jonnie Kind, MD. Electronically Signed: [place]. 09/02/18. 8:52 AM.  I personally  performed the services described in this documentation, which was SCRIBED in my presence. The recorded information has been reviewed and considered accurate. It has been edited as necessary during review. Jonnie Kind, MD

## 2018-09-18 ENCOUNTER — Telehealth: Payer: Self-pay | Admitting: *Deleted

## 2018-09-18 NOTE — Telephone Encounter (Signed)
Pt called requesting medication to treat a UTI. She states that she called her regular GYN and they advised her that she would need to bring a urine sample before treatment. She is asking if we would send an antibiotic since Dr Glo Herring did surgery on her. Advised pt that she should follow the request of her primary GYN provider. Pt verbalized understanding.

## 2018-09-18 NOTE — Telephone Encounter (Signed)
Patient called stating Dr Glo Herring done a surgery on her but she is not a normal patient of his, patient states she thinks she may have a uti and her gyn wants her to take a urine sample there to [place] and patient states she has nothing to put that in and wanted to know if we could treat her for the uti. Please advise [phone] ext [phone]

## 2018-09-19 ENCOUNTER — Telehealth: Payer: Self-pay | Admitting: Obstetrics and Gynecology

## 2018-09-19 MED ORDER — SULFAMETHOXAZOLE-TRIMETHOPRIM 400-80 MG PO TABS: 1.0000 | ORAL_TABLET | Freq: Two times a day (BID) | ORAL | 0 refills | Status: DC

## 2018-09-19 NOTE — Telephone Encounter (Signed)
Septra DS x3d called escribed to [facility].

## 2019-02-10 ENCOUNTER — Other Ambulatory Visit: Payer: Self-pay | Admitting: Obstetrics and Gynecology

## 2019-02-10 DIAGNOSIS — Z1231 Encounter for screening mammogram for malignant neoplasm of breast: Secondary | ICD-10-CM

## 2019-05-19 ENCOUNTER — Other Ambulatory Visit: Payer: Self-pay

## 2019-05-19 ENCOUNTER — Ambulatory Visit
Admission: RE | Admit: 2019-05-19 | Discharge: 2019-05-19 | Disposition: A | Discharge status: Home / Self Care | Payer: Commercial Managed Care - PPO | Source: Ambulatory Visit | Attending: Obstetrics and Gynecology | Admitting: Obstetrics and Gynecology

## 2019-05-19 DIAGNOSIS — Z1231 Encounter for screening mammogram for malignant neoplasm of breast: Secondary | ICD-10-CM

## 2020-04-13 ENCOUNTER — Other Ambulatory Visit: Payer: Self-pay | Admitting: Obstetrics and Gynecology

## 2020-04-13 DIAGNOSIS — Z1231 Encounter for screening mammogram for malignant neoplasm of breast: Secondary | ICD-10-CM

## 2020-05-29 ENCOUNTER — Other Ambulatory Visit: Payer: Self-pay

## 2020-05-29 ENCOUNTER — Ambulatory Visit
Admission: RE | Admit: 2020-05-29 | Discharge: 2020-05-29 | Disposition: A | Discharge status: Home / Self Care | Payer: Commercial Managed Care - PPO | Source: Ambulatory Visit | Attending: Obstetrics and Gynecology | Admitting: Obstetrics and Gynecology

## 2020-05-29 DIAGNOSIS — Z1231 Encounter for screening mammogram for malignant neoplasm of breast: Secondary | ICD-10-CM

## 2020-09-19 ENCOUNTER — Encounter: Payer: Self-pay | Admitting: Internal Medicine

## 2020-10-04 ENCOUNTER — Encounter: Payer: Self-pay | Admitting: Gynecologic Oncology

## 2020-10-05 ENCOUNTER — Inpatient Hospital Stay: Payer: Commercial Managed Care - PPO | Attending: Gynecologic Oncology | Admitting: Gynecologic Oncology

## 2020-10-05 ENCOUNTER — Other Ambulatory Visit: Payer: Self-pay | Admitting: Gynecologic Oncology

## 2020-10-05 ENCOUNTER — Other Ambulatory Visit: Payer: Self-pay

## 2020-10-05 ENCOUNTER — Telehealth: Payer: Self-pay | Admitting: *Deleted

## 2020-10-05 ENCOUNTER — Encounter: Payer: Self-pay | Admitting: Gynecologic Oncology

## 2020-10-05 VITALS — BP 132/82 | HR 66 | Temp 97.4°F | Resp 20 | Wt 176.2 lb

## 2020-10-05 DIAGNOSIS — B9689 Other specified bacterial agents as the cause of diseases classified elsewhere: Secondary | ICD-10-CM

## 2020-10-05 DIAGNOSIS — Z79899 Other long term (current) drug therapy: Secondary | ICD-10-CM | POA: Diagnosis not present

## 2020-10-05 DIAGNOSIS — N893 Dysplasia of vagina, unspecified: Secondary | ICD-10-CM

## 2020-10-05 DIAGNOSIS — N760 Acute vaginitis: Secondary | ICD-10-CM | POA: Diagnosis not present

## 2020-10-05 DIAGNOSIS — A630 Anogenital (venereal) warts: Secondary | ICD-10-CM | POA: Diagnosis not present

## 2020-10-05 DIAGNOSIS — N891 Moderate vaginal dysplasia: Secondary | ICD-10-CM | POA: Insufficient documentation

## 2020-10-05 MED ORDER — SENNOSIDES-DOCUSATE SODIUM 8.6-50 MG PO TABS: 2.0000 | ORAL_TABLET | Freq: Every day | ORAL | 0 refills | Status: DC

## 2020-10-05 MED ORDER — METRONIDAZOLE 500 MG PO TABS: 500.0000 mg | ORAL_TABLET | Freq: Two times a day (BID) | ORAL | 0 refills | Status: AC

## 2020-10-05 MED ORDER — IBUPROFEN 800 MG PO TABS: 800.0000 mg | ORAL_TABLET | Freq: Three times a day (TID) | ORAL | 1 refills | Status: DC | PRN

## 2020-10-05 NOTE — Telephone Encounter (Signed)
PC to patient, informed her Dr. Berline Lopes has sent a prescription for flagyl to her pharmacy to treat her BV.  Prescription is for two tablets/day for a week, patient instructed to finish the entire prescription and to avoid alcohol while taking this medication.  Patient verbalizes understanding.

## 2020-10-05 NOTE — Patient Instructions (Signed)
Preparing for your Surgery  Plan for surgery on Oct 26, 2020 with Dr. Jeral Pinch at [facility]. You will be scheduled for a laser of the vagina, possible excision of the vagina, possible vaginal biopsies.   Pre-operative Testing -You will receive a phone call from presurgical testing at [facility] to discuss pre-operative instructions, labs if needed, and COVID test. The COVID test normally happens 3 days prior to the surgery and they ask that you self quarantine after the test up until surgery to decrease chance of exposure.  -Bring your insurance card, copy of an advanced directive if applicable, medication list  -You should not be taking blood thinners or aspirin at least ten days prior to surgery unless instructed by your surgeon.  -Do not take supplements such as fish oil (omega 3), red yeast rice, turmeric before your surgery. You want to avoid medications with aspirin in them including headache powders such as BC or Goody's), Excedrin migraine.  Day Before Surgery at [facility] will be advised you can have clear liquids up until 3 hours before your surgery.    Your role in recovery Your role is to become active as soon as directed by your doctor, while still giving yourself time to heal.  Rest when you feel tired. You will be asked to do the following in order to speed your recovery:  - Cough and breathe deeply. This helps to clear and expand your lungs and can prevent pneumonia after surgery.  - [place]. Do mild physical activity. Walking or moving your legs help your circulation and body functions return to normal. Do not try to get up or walk alone the first time after surgery.   -If you develop swelling on one leg or the other, pain in the back of your leg, redness/warmth in one of your legs, please call the office or go to the Emergency Room to have a doppler to rule out a blood clot. For shortness of breath, chest  pain-seek care in the Emergency Room as soon as possible. - Actively manage your pain. Managing your pain lets you move in comfort. We will ask you to rate your pain on a scale of zero to 10. It is your responsibility to tell your doctor or nurse where and how much you hurt so your pain can be treated.  Pain Management After Surgery -Make sure that you have Tylenol and Ibuprofen at home to use on a regular basis after surgery for pain control. We recommend alternating the medications every hour to six hours since they work differently and are processed in the body differently for pain relief.  Bowel Regimen -You have been prescribed Sennakot-S to take nightly to prevent constipation especially if you are taking the narcotic pain medication intermittently.  It is important to prevent constipation and drink adequate amounts of liquids. You can stop taking this medication when you are not taking pain medication and you are back on your normal bowel routine.  Risks of Surgery Risks of surgery are low but include bleeding, infection, damage to surrounding structures, re-operation, blood clots, and very rarely death.  AFTER SURGERY INSTRUCTIONS  Activity: 1. Be up and out of the bed during the day.  Take a nap if needed.  You may walk up steps but be careful and use the hand rail.  Stair climbing will tire you more than you think, you may need to stop part way and rest.  2. No lifting or straining for 6 weeks over 10 pounds. No pushing, pulling, straining for 6 weeks.  3. No driving for minimum of 24 hours after the procedure.    4. You can shower as soon as the next day after surgery. No tub baths or submerging your body in water until cleared by your surgeon.   5. No sexual activity and nothing in the vagina for 4-6 weeks.  6. You may experience vaginal spotting or discharge after surgery. The spotting is normal but if you experience heavy bleeding, call our office.  9. Take Tylenol or  ibuprofen for pain.  Monitor your Tylenol intake to a max of 4,000 mg in a 24 hour period. You can alternate these medications after surgery.  Diet: 1. You are cleared to resume your normal (before surgery) diet after your procedure.  2. It is safe to use a laxative, such as Miralax or Colace, if you have difficulty moving your bowels. You have been prescribed Sennakot at bedtime every evening to keep bowel movements regular and to prevent constipation.    Wound Care: 1. Keep clean and dry.  Shower daily.  Reasons to call the Doctor:  Fever - Oral temperature greater than 100.4 degrees Fahrenheit  Foul-smelling vaginal discharge  Difficulty urinating  Nausea and vomiting  Increased pain at the site of the incision that is unrelieved with pain medicine.  Difficulty breathing with or without chest pain  New calf pain especially if only on one side  Sudden, continuing increased vaginal bleeding with or without clots.   Contacts: For questions or concerns you should contact:  Dr. Jeral Pinch at [phone]  Joylene John, NP at [phone]  After Hours: call [phone] and have the GYN Oncologist paged/contacted (after 5 pm or on the weekends).  Messages sent via mychart are for non-urgent matters and are not responded to after hours so for urgent needs, please call the after hours number.

## 2020-10-05 NOTE — Progress Notes (Signed)
GYNECOLOGIC ONCOLOGY NEW PATIENT CONSULTATION   Patient Name: Maria Velasquez  Patient Age: 45 y.o. 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  We discussed recent biopsy findings of vaginal dysplasia.  I discussed treatment options which for vaginal dysplasia include surgical options (excision versus ablation) and topical treatments.  My recommendation, given multifocal areas is for laser ablation.  It is a little bit difficult to tell if the area at the apex of the vagina is desquamative in nature or whether this is due to healing from recent biopsy.  At the time of surgery, we discussed that I may ultimately excise the apical lesion and laser the sidewall lesion.  The goal of surgery is both to treat the dysplasia but also rule out any invasive cancer.  We discussed the role that HPV plays in the development of dysplasia and ultimately cancer.  The patient is still within the age range where she could receive the HPV vaccine.  While that will not prevent disease related to already present HPV, it would help prevent infection with additional strains of HPV moving forward.  I have recommended that she reach out to her gynecologist office to see if this could be covered by her insurance and scheduled with them.  We discussed the plan for exam under anesthesia, possible vaginal biopsies, possible vaginal lesion excision, vaginal laser ablation, and any other indicated procedures.  We discussed the risks of surgery which include but are not limited to bleeding, need for blood transfusion, damage to surrounding structures, need for further surgery, medical complications (including DVT, PE, stroke, cardiac arrest, pneumonia, and  rarely death).  All the patient's questions were answered today.  Perioperative instructions were reviewed with her.  A copy of this note was sent to the patient's referring provider.   Jeral Pinch, MD  Division of Gynecologic Oncology  Department of Obstetrics and Gynecology  [facility]  ___________________________________________  Chief Complaint: No chief complaint on file.   History of Present Illness:  Maria Velasquez is a 45 y.o. y.o. female who is seen in consultation at the request of Dr. Philis Pique for an evaluation of [name].  Patient presents with her husband, Suezanne Jacquet, today for the visit.  She was referred in the setting of a history of HPV and prior dysplasia with recent Pap test and cervical biopsies confirming vaginal dysplasia.  She had a hysterectomy for uterine fibroids in 2016.  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  Patient denies any vaginal bleeding or discharge other than some black-tinged discharge since her vaginoscopy and biopsies.  She has some occasional cramping in her left lower abdomen and is being referred to a gastroenterologist.  Prior pathology and cytology history: Hysterectomy 10/2014: no atypia or malignancy of the cervix noted Pap 02/2013: negative, HPV testing not performed Pap 11/2010: negative, HPV testing not performed Pap 11/2009: negative, HPV testing not performed Pap 09/2008: negative, HPV testing not performed [place] history of a LEEP procedure for cervical dysplasia in approximately 2010 Colposcopy 11/2000: cervical biopsy at 69 o'c with koilocytotic atypia c/w HPV  effect; vaginal fornix biopsy with squamous mucosa, ECC benign endocervical mucosa  Lives in [place] with husband and son. She works at a desk job. She denies  any tobacco use.  Family history is significant for pancreatic cancer in her maternal grandfather, breast cancer in her mother, and colon cancer in her paternal grandfather.  She notes having recent genetic testing (she thinks this was a multi gene panel (and did not have any pathogenic mutations.  PAST MEDICAL HISTORY:  Past Medical History:  Diagnosis Date  . Headache    otc med prn  . Lipoma   . Ovarian torsion 08/2018  . SVD (spontaneous vaginal delivery)    x 1  . Vaginal dysplasia      PAST SURGICAL HISTORY:  Past Surgical History:  Procedure Laterality Date  . BACK SURGERY  01/30/2007   L4-L5, L5-S1 herniated disk  . DILATION AND CURETTAGE OF UTERUS  08/05/2009   missed abortion  . RIGHT OOPHORECTOMY Right   . ROBOTIC ASSISTED BILATERAL SALPINGO OOPHERECTOMY Bilateral 11/16/2014   Procedure: ROBOTIC ASSISTED BILATERAL SALPINGECTOMY;  Surgeon: Bobbye Charleston, MD;  Location: [facility];  Service: Gynecology;  Laterality: Bilateral;  . ROBOTIC ASSISTED TOTAL HYSTERECTOMY N/A 11/16/2014   w/ BS  . TUBAL LIGATION  03/20/2010   filshie clips  . WISDOM TOOTH EXTRACTION      OB/GYN HISTORY:  OB History  Gravida Para Term Preterm AB Living  2 1   1 1 1   SAB IAB Ectopic Multiple Live Births  1       1    # Outcome Date GA Lbr Len/2nd Weight Sex Delivery Anes PTL Lv  2 SAB           1 Preterm     M Vag-Spont   LIV    Patient's last menstrual period was 10/19/2014 (approximate).  Age at menarche: 45 Age at menopause: N/A, menses stopped with her.  Has infrequent hot flashes Hx of HRT: Denies Hx of STDs: HPV Last pap: See HPI History of abnormal pap smears: Yes, per HPI  SCREENING STUDIES:  Last mammogram: 2021  Last colonoscopy: N/A  MEDICATIONS: Outpatient Encounter Medications as of 10/05/2020  Medication Sig  . ALPRAZolam (XANAX) 0.5 MG tablet Take 0.5 mg by mouth daily as needed. for anxiety  . ibuprofen (ADVIL) 800 MG tablet Take 1 tablet (800 mg total) by mouth  every 8 (eight) hours as needed for moderate pain. For AFTER surgery  . ibuprofen (ADVIL,MOTRIN) 200 MG tablet Take 200-800 mg by mouth as needed for headache or mild pain. Only needs about twice a week  . metroNIDAZOLE (FLAGYL) 500 MG tablet Take 1 tablet (500 mg total) by mouth 2 (two) times daily for 7 days.  Marland Kitchen senna-docusate (SENOKOT-S) 8.6-50 MG tablet Take 2 tablets by mouth at bedtime. For AFTER surgery, do not take if having diarrhea  . HYDROcodone-acetaminophen (NORCO/VICODIN) 5-325 MG tablet Take 1-2 tablets by mouth every 4 (four) hours as needed for moderate pain. (Patient not taking: Reported on 10/04/2020)  . phentermine (ADIPEX-P) 37.5 MG tablet Take 37.5 mg by mouth daily. (Patient not taking: Reported on 10/04/2020)  . Simethicone (GAS-X PO) Take by mouth as needed. (Patient not taking: Reported on 10/04/2020)  . sulfamethoxazole-trimethoprim (BACTRIM) 400-80 MG tablet Take 1 tablet by mouth 2 (two) times daily. Twice daily for uti (Patient not taking: Reported on 10/04/2020)  . tiZANidine (ZANAFLEX) 4 MG tablet Take 2 mg by mouth as needed. (Patient not taking: Reported on 10/04/2020)  No facility-administered encounter medications on file as of 10/05/2020.    ALLERGIES:  Allergies  Allergen Reactions  . Ciprofloxacin   . Oxycodone Itching and Other (See Comments)    Causes the sensation of skin crawling.  . Oxycodone-Acetaminophen Hives  . Prednisone Other (See Comments)    Headaches and nausea     FAMILY HISTORY:  Family History  Problem Relation Age of Onset  . Breast cancer Mother   . Pancreatic cancer Maternal Grandfather   . Colon cancer Paternal Grandfather      SOCIAL HISTORY:  Social Connections: Not on file    REVIEW OF SYSTEMS:  Denies appetite changes, fevers, chills, fatigue, unexplained weight changes. Denies hearing loss, neck lumps or masses, mouth sores, ringing in ears or voice changes. Denies cough or wheezing.  Denies shortness of  breath. Denies chest pain or palpitations. Denies leg swelling. Denies abdominal distention, pain, blood in stools, constipation, diarrhea, nausea, vomiting, or early satiety. Denies pain with intercourse, dysuria, frequency, hematuria or incontinence. Denies hot flashes, pelvic pain, vaginal bleeding or vaginal discharge.   Denies joint pain, back pain or muscle pain/cramps. Denies itching, rash, or wounds. Denies dizziness, headaches, numbness or seizures. Denies swollen lymph nodes or glands, denies easy bruising or bleeding. Denies anxiety, depression, confusion, or decreased concentration.  Physical Exam:  Vital Signs for this encounter:  Blood pressure 132/82, pulse 66, temperature (!) 97.4 F (36.3 C), temperature source Tympanic, resp. rate 20, weight 176 lb 4 oz (79.9 kg), last menstrual period 10/19/2014, SpO2 100 %. Body mass index is 31.22 kg/m. General: Alert, oriented, no acute distress.  HEENT: Normocephalic, atraumatic. Sclera anicteric.  Chest: Clear to auscultation bilaterally. No wheezes, rhonchi, or rales. Cardiovascular: Regular rate and rhythm, no murmurs, rubs, or gallops.  Abdomen: Normoactive bowel sounds. Soft, nondistended, nontender to palpation. No masses or hepatosplenomegaly appreciated. No palpable fluid wave. Well healed laparoscopic incisions. Extremities: Grossly normal range of motion. Warm, well perfused. No edema bilaterally.  Skin: No rashes or lesions.  Lymphatics: No cervical, supraclavicular, or inguinal adenopathy.  GU:  Normal external female genitalia. No lesions. No discharge or bleeding.             Bladder/urethra:  No lesions or masses, well supported bladder             Vagina: well rugated vaginal mucosa. Approximately 1cm mildly erythematous and ulcerated lesion along the vaginal cuff at the midline (c/w prior biopsy site), hyperkeratotic lesion along right vaginal sidewall near cuff. No nodularity or masses appreciated on bimanual  exam.             Cervix/uterus: surgically absent.             Adnexa: No masses appreciated.  Rectal: Deferred.  LABORATORY AND RADIOLOGIC DATA:  Outside medical records were reviewed to synthesize the above history, along with the history and physical obtained during the visit.   Lab Results  Component Value Date   WBC 17.3 (H) 08/28/2018   HGB 14.5 08/28/2018   HCT 43.8 08/28/2018   PLT 321 08/28/2018   GLUCOSE 118 (H) 08/28/2018   ALT 14 08/28/2018   AST 18 08/28/2018   NA 136 08/28/2018   K 3.3 (L) 08/28/2018   CL 105 08/28/2018   CREATININE 0.80 08/28/2018   BUN 11 08/28/2018   CO2 22 08/28/2018   INR 0.9 01/29/2007

## 2020-10-05 NOTE — H&P (View-Only) (Signed)
GYNECOLOGIC ONCOLOGY NEW PATIENT CONSULTATION   Patient Name: Maria Velasquez  Patient Age: 45 y.o. 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  We discussed recent biopsy findings of vaginal dysplasia.  I discussed treatment options which for vaginal dysplasia include surgical options (excision versus ablation) and topical treatments.  My recommendation, given multifocal areas is for laser ablation.  It is a little bit difficult to tell if the area at the apex of the vagina is desquamative in nature or whether this is due to healing from recent biopsy.  At the time of surgery, we discussed that I may ultimately excise the apical lesion and laser the sidewall lesion.  The goal of surgery is both to treat the dysplasia but also rule out any invasive cancer.  We discussed the role that HPV plays in the development of dysplasia and ultimately cancer.  The patient is still within the age range where she could receive the HPV vaccine.  While that will not prevent disease related to already present HPV, it would help prevent infection with additional strains of HPV moving forward.  I have recommended that she reach out to her gynecologist office to see if this could be covered by her insurance and scheduled with them.  We discussed the plan for exam under anesthesia, possible vaginal biopsies, possible vaginal lesion excision, vaginal laser ablation, and any other indicated procedures.  We discussed the risks of surgery which include but are not limited to bleeding, need for blood transfusion, damage to surrounding structures, need for further surgery, medical complications (including DVT, PE, stroke, cardiac arrest, pneumonia, and  rarely death).  All the patient's questions were answered today.  Perioperative instructions were reviewed with her.  A copy of this note was sent to the patient's referring provider.   Jeral Pinch, MD  Division of Gynecologic Oncology  Department of Obstetrics and Gynecology  [facility]  ___________________________________________  Chief Complaint: No chief complaint on file.   History of Present Illness:  Maria Velasquez is a 45 y.o. y.o. female who is seen in consultation at the request of Dr. Philis Pique for an evaluation of Maria Velasquez.  Patient presents with her husband, Maria Velasquez, today for the visit.  She was referred in the setting of a history of HPV and prior dysplasia with recent Pap test and cervical biopsies confirming vaginal dysplasia.  She had a hysterectomy for uterine fibroids in 2016.  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  Patient denies any vaginal bleeding or discharge other than some black-tinged discharge since her vaginoscopy and biopsies.  She has some occasional cramping in her left lower abdomen and is being referred to a gastroenterologist.  Prior pathology and cytology history: Hysterectomy 10/2014: no atypia or malignancy of the cervix noted Pap 02/2013: negative, HPV testing not performed Pap 11/2010: negative, HPV testing not performed Pap 11/2009: negative, HPV testing not performed Pap 09/2008: negative, HPV testing not performed [place] history of a LEEP procedure for cervical dysplasia in approximately 2010 Colposcopy 11/2000: cervical biopsy at 10 o'c with koilocytotic atypia c/w HPV  effect; vaginal fornix biopsy with squamous mucosa, ECC benign endocervical mucosa  Lives in [place] with husband and son. She works at a desk job. She denies  any tobacco use.  Family history is significant for pancreatic cancer in her maternal grandfather, breast cancer in her mother, and colon cancer in her paternal grandfather.  She notes having recent genetic testing (she thinks this was a multi gene panel (and did not have any pathogenic mutations.  PAST MEDICAL HISTORY:  Past Medical History:  Diagnosis Date  . Headache    otc med prn  . Lipoma   . Ovarian torsion 08/2018  . SVD (spontaneous vaginal delivery)    x 1  . Vaginal dysplasia      PAST SURGICAL HISTORY:  Past Surgical History:  Procedure Laterality Date  . BACK SURGERY  01/30/2007   L4-L5, L5-S1 herniated disk  . DILATION AND CURETTAGE OF UTERUS  08/05/2009   missed abortion  . RIGHT OOPHORECTOMY Right   . ROBOTIC ASSISTED BILATERAL SALPINGO OOPHERECTOMY Bilateral 11/16/2014   Procedure: ROBOTIC ASSISTED BILATERAL SALPINGECTOMY;  Surgeon: Bobbye Charleston, MD;  Location: [facility];  Service: Gynecology;  Laterality: Bilateral;  . ROBOTIC ASSISTED TOTAL HYSTERECTOMY N/A 11/16/2014   w/ BS  . TUBAL LIGATION  03/20/2010   filshie clips  . WISDOM TOOTH EXTRACTION      OB/GYN HISTORY:  OB History  Gravida Para Term Preterm AB Living  2 1   1 1 1   SAB IAB Ectopic Multiple Live Births  1       1    # Outcome Date GA Lbr Len/2nd Weight Sex Delivery Anes PTL Lv  2 SAB           1 Preterm     M Vag-Spont   LIV    Patient's last menstrual period was 10/19/2014 (approximate).  Age at menarche: 35 Age at menopause: N/A, menses stopped with her.  Has infrequent hot flashes Hx of HRT: Denies Hx of STDs: HPV Last pap: See HPI History of abnormal pap smears: Yes, per HPI  SCREENING STUDIES:  Last mammogram: 2021  Last colonoscopy: N/A  MEDICATIONS: Outpatient Encounter Medications as of 10/05/2020  Medication Sig  . ALPRAZolam (XANAX) 0.5 MG tablet Take 0.5 mg by mouth daily as needed. for anxiety  . ibuprofen (ADVIL) 800 MG tablet Take 1 tablet (800 mg total) by mouth  every 8 (eight) hours as needed for moderate pain. For AFTER surgery  . ibuprofen (ADVIL,MOTRIN) 200 MG tablet Take 200-800 mg by mouth as needed for headache or mild pain. Only needs about twice a week  . metroNIDAZOLE (FLAGYL) 500 MG tablet Take 1 tablet (500 mg total) by mouth 2 (two) times daily for 7 days.  Marland Kitchen senna-docusate (SENOKOT-S) 8.6-50 MG tablet Take 2 tablets by mouth at bedtime. For AFTER surgery, do not take if having diarrhea  . HYDROcodone-acetaminophen (NORCO/VICODIN) 5-325 MG tablet Take 1-2 tablets by mouth every 4 (four) hours as needed for moderate pain. (Patient not taking: Reported on 10/04/2020)  . phentermine (ADIPEX-P) 37.5 MG tablet Take 37.5 mg by mouth daily. (Patient not taking: Reported on 10/04/2020)  . Simethicone (GAS-X PO) Take by mouth as needed. (Patient not taking: Reported on 10/04/2020)  . sulfamethoxazole-trimethoprim (BACTRIM) 400-80 MG tablet Take 1 tablet by mouth 2 (two) times daily. Twice daily for uti (Patient not taking: Reported on 10/04/2020)  . tiZANidine (ZANAFLEX) 4 MG tablet Take 2 mg by mouth as needed. (Patient not taking: Reported on 10/04/2020)  No facility-administered encounter medications on file as of 10/05/2020.    ALLERGIES:  Allergies  Allergen Reactions  . Ciprofloxacin   . Oxycodone Itching and Other (See Comments)    Causes the sensation of skin crawling.  . Oxycodone-Acetaminophen Hives  . Prednisone Other (See Comments)    Headaches and nausea     FAMILY HISTORY:  Family History  Problem Relation Age of Onset  . Breast cancer Mother   . Pancreatic cancer Maternal Grandfather   . Colon cancer Paternal Grandfather      SOCIAL HISTORY:  Social Connections: Not on file    REVIEW OF SYSTEMS:  Denies appetite changes, fevers, chills, fatigue, unexplained weight changes. Denies hearing loss, neck lumps or masses, mouth sores, ringing in ears or voice changes. Denies cough or wheezing.  Denies shortness of  breath. Denies chest pain or palpitations. Denies leg swelling. Denies abdominal distention, pain, blood in stools, constipation, diarrhea, nausea, vomiting, or early satiety. Denies pain with intercourse, dysuria, frequency, hematuria or incontinence. Denies hot flashes, pelvic pain, vaginal bleeding or vaginal discharge.   Denies joint pain, back pain or muscle pain/cramps. Denies itching, rash, or wounds. Denies dizziness, headaches, numbness or seizures. Denies swollen lymph nodes or glands, denies easy bruising or bleeding. Denies anxiety, depression, confusion, or decreased concentration.  Physical Exam:  Vital Signs for this encounter:  Blood pressure 132/82, pulse 66, temperature (!) 97.4 F (36.3 C), temperature source Tympanic, resp. rate 20, weight 176 lb 4 oz (79.9 kg), last menstrual period 10/19/2014, SpO2 100 %. Body mass index is 31.22 kg/m. General: Alert, oriented, no acute distress.  HEENT: Normocephalic, atraumatic. Sclera anicteric.  Chest: Clear to auscultation bilaterally. No wheezes, rhonchi, or rales. Cardiovascular: Regular rate and rhythm, no murmurs, rubs, or gallops.  Abdomen: Normoactive bowel sounds. Soft, nondistended, nontender to palpation. No masses or hepatosplenomegaly appreciated. No palpable fluid wave. Well healed laparoscopic incisions. Extremities: Grossly normal range of motion. Warm, well perfused. No edema bilaterally.  Skin: No rashes or lesions.  Lymphatics: No cervical, supraclavicular, or inguinal adenopathy.  GU:  Normal external female genitalia. No lesions. No discharge or bleeding.             Bladder/urethra:  No lesions or masses, well supported bladder             Vagina: well rugated vaginal mucosa. Approximately 1cm mildly erythematous and ulcerated lesion along the vaginal cuff at the midline (c/w prior biopsy site), hyperkeratotic lesion along right vaginal sidewall near cuff. No nodularity or masses appreciated on bimanual  exam.             Cervix/uterus: surgically absent.             Adnexa: No masses appreciated.  Rectal: Deferred.  LABORATORY AND RADIOLOGIC DATA:  Outside medical records were reviewed to synthesize the above history, along with the history and physical obtained during the visit.   Lab Results  Component Value Date   WBC 17.3 (H) 08/28/2018   HGB 14.5 08/28/2018   HCT 43.8 08/28/2018   PLT 321 08/28/2018   GLUCOSE 118 (H) 08/28/2018   ALT 14 08/28/2018   AST 18 08/28/2018   NA 136 08/28/2018   K 3.3 (L) 08/28/2018   CL 105 08/28/2018   CREATININE 0.80 08/28/2018   BUN 11 08/28/2018   CO2 22 08/28/2018   INR 0.9 01/29/2007

## 2020-10-20 ENCOUNTER — Encounter (HOSPITAL_BASED_OUTPATIENT_CLINIC_OR_DEPARTMENT_OTHER): Payer: Self-pay | Admitting: Gynecologic Oncology

## 2020-10-20 ENCOUNTER — Other Ambulatory Visit: Payer: Self-pay

## 2020-10-20 NOTE — Progress Notes (Signed)
Spoke w/ via phone for pre-op interview--- PT Lab needs dos---- no              Lab results------ no COVID test ------ 10-24-2020 @ 1305 Arrive at ------- 1100 on 10-26-2020 NPO after MN NO Solid Food.  Clear liquids from MN until--- 1000 Med rec completed Medications to take morning of surgery ----- NONE Diabetic medication ----- n/a Patient instructed to bring photo id and insurance card day of surgery Patient aware to have Driver (ride ) / caregiver    for 24 hours after surgery -- husband, [facility] Patient Special Instructions ----- n/a Pre-Op special Istructions ----- n/a Patient verbalized understanding of instructions that were given at this phone interview. Patient denies shortness of breath, chest pain, fever, cough at this phone interview.

## 2020-10-24 ENCOUNTER — Other Ambulatory Visit (HOSPITAL_COMMUNITY)
Admission: RE | Admit: 2020-10-24 | Discharge: 2020-10-24 | Disposition: A | Discharge status: Home / Self Care | Payer: Commercial Managed Care - PPO | Source: Ambulatory Visit | Attending: Gynecologic Oncology | Admitting: Gynecologic Oncology

## 2020-10-24 DIAGNOSIS — Z01812 Encounter for preprocedural laboratory examination: Secondary | ICD-10-CM | POA: Insufficient documentation

## 2020-10-24 DIAGNOSIS — Z20822 Contact with and (suspected) exposure to covid-19: Secondary | ICD-10-CM | POA: Diagnosis not present

## 2020-10-25 ENCOUNTER — Ambulatory Visit: Payer: Commercial Managed Care - PPO | Admitting: Nurse Practitioner

## 2020-10-25 ENCOUNTER — Telehealth: Payer: Self-pay

## 2020-10-25 LAB — SARS CORONAVIRUS 2 (TAT 6-24 HRS): SARS Coronavirus 2: NEGATIVE

## 2020-10-25 NOTE — Telephone Encounter (Signed)
Maria Velasquez states that she understands her pre-op instructions as reviewed on 10-20-20 with Maria Linus Mako. No questions or concerns noted at this time.

## 2020-10-26 ENCOUNTER — Ambulatory Visit (HOSPITAL_BASED_OUTPATIENT_CLINIC_OR_DEPARTMENT_OTHER): Payer: Commercial Managed Care - PPO | Admitting: Anesthesiology

## 2020-10-26 ENCOUNTER — Encounter (HOSPITAL_BASED_OUTPATIENT_CLINIC_OR_DEPARTMENT_OTHER): Payer: Self-pay | Admitting: Gynecologic Oncology

## 2020-10-26 ENCOUNTER — Encounter (HOSPITAL_BASED_OUTPATIENT_CLINIC_OR_DEPARTMENT_OTHER)
Admission: RE | Disposition: A | Discharge status: Home / Self Care | Payer: Self-pay | Source: Ambulatory Visit | Attending: Gynecologic Oncology

## 2020-10-26 ENCOUNTER — Other Ambulatory Visit: Payer: Self-pay

## 2020-10-26 ENCOUNTER — Ambulatory Visit (HOSPITAL_BASED_OUTPATIENT_CLINIC_OR_DEPARTMENT_OTHER)
Admission: RE | Admit: 2020-10-26 | Discharge: 2020-10-26 | Disposition: A | Discharge status: Home / Self Care | Payer: Commercial Managed Care - PPO | Source: Ambulatory Visit | Attending: Gynecologic Oncology | Admitting: Gynecologic Oncology

## 2020-10-26 DIAGNOSIS — Z888 Allergy status to other drugs, medicaments and biological substances status: Secondary | ICD-10-CM | POA: Diagnosis not present

## 2020-10-26 DIAGNOSIS — Z800 Family history of malignant neoplasm of digestive organs: Secondary | ICD-10-CM | POA: Diagnosis not present

## 2020-10-26 DIAGNOSIS — B977 Papillomavirus as the cause of diseases classified elsewhere: Secondary | ICD-10-CM | POA: Insufficient documentation

## 2020-10-26 DIAGNOSIS — Z87410 Personal history of cervical dysplasia: Secondary | ICD-10-CM | POA: Diagnosis not present

## 2020-10-26 DIAGNOSIS — Z885 Allergy status to narcotic agent status: Secondary | ICD-10-CM | POA: Insufficient documentation

## 2020-10-26 DIAGNOSIS — Z803 Family history of malignant neoplasm of breast: Secondary | ICD-10-CM | POA: Diagnosis not present

## 2020-10-26 DIAGNOSIS — Z881 Allergy status to other antibiotic agents status: Secondary | ICD-10-CM | POA: Insufficient documentation

## 2020-10-26 DIAGNOSIS — N891 Moderate vaginal dysplasia: Secondary | ICD-10-CM | POA: Insufficient documentation

## 2020-10-26 DIAGNOSIS — D072 Carcinoma in situ of vagina: Secondary | ICD-10-CM

## 2020-10-26 DIAGNOSIS — Z79899 Other long term (current) drug therapy: Secondary | ICD-10-CM | POA: Insufficient documentation

## 2020-10-26 DIAGNOSIS — Z90710 Acquired absence of both cervix and uterus: Secondary | ICD-10-CM | POA: Diagnosis not present

## 2020-10-26 DIAGNOSIS — N893 Dysplasia of vagina, unspecified: Secondary | ICD-10-CM | POA: Diagnosis present

## 2020-10-26 HISTORY — PX: CO2 LASER APPLICATION: SHX5778

## 2020-10-26 HISTORY — DX: Personal history of other benign neoplasm: Z86.018

## 2020-10-26 HISTORY — DX: Personal history of cervical dysplasia: Z87.410

## 2020-10-26 HISTORY — PX: VULVA /PERINEUM BIOPSY: SHX319

## 2020-10-26 SURGERY — CO2 LASER APPLICATION
Anesthesia: General | Site: Vagina

## 2020-10-26 MED ORDER — FENTANYL CITRATE (PF) 100 MCG/2ML IJ SOLN
INTRAMUSCULAR | Status: AC
  Filled 2020-10-26: qty 2

## 2020-10-26 MED ORDER — ONDANSETRON HCL 4 MG/2ML IJ SOLN
INTRAMUSCULAR | Status: AC
  Filled 2020-10-26: qty 2

## 2020-10-26 MED ORDER — LIDOCAINE 2% (20 MG/ML) 5 ML SYRINGE
INTRAMUSCULAR | Status: DC | PRN
  Administered 2020-10-26: 40 mg via INTRAVENOUS

## 2020-10-26 MED ORDER — BUPIVACAINE HCL 0.25 % IJ SOLN
INTRAMUSCULAR | Status: DC | PRN
  Administered 2020-10-26: 10 mL

## 2020-10-26 MED ORDER — PROPOFOL 10 MG/ML IV BOLUS
INTRAVENOUS | Status: AC
  Filled 2020-10-26: qty 20

## 2020-10-26 MED ORDER — KETOROLAC TROMETHAMINE 30 MG/ML IJ SOLN
INTRAMUSCULAR | Status: AC
  Filled 2020-10-26: qty 1

## 2020-10-26 MED ORDER — FENTANYL CITRATE (PF) 100 MCG/2ML IJ SOLN
25.0000 ug | INTRAMUSCULAR | Status: DC | PRN
  Administered 2020-10-26 (×2): 25 ug via INTRAVENOUS

## 2020-10-26 MED ORDER — ACETAMINOPHEN 500 MG PO TABS
1000.0000 mg | ORAL_TABLET | ORAL | Status: AC
  Administered 2020-10-26: 1000 mg via ORAL

## 2020-10-26 MED ORDER — GABAPENTIN 300 MG PO CAPS
300.0000 mg | ORAL_CAPSULE | ORAL | Status: AC
  Administered 2020-10-26: 300 mg via ORAL

## 2020-10-26 MED ORDER — DEXAMETHASONE SODIUM PHOSPHATE 10 MG/ML IJ SOLN
INTRAMUSCULAR | Status: AC
  Filled 2020-10-26: qty 1

## 2020-10-26 MED ORDER — DEXAMETHASONE SODIUM PHOSPHATE 10 MG/ML IJ SOLN
INTRAMUSCULAR | Status: DC | PRN
  Administered 2020-10-26: 8 mg via INTRAVENOUS

## 2020-10-26 MED ORDER — MIDAZOLAM HCL 2 MG/2ML IJ SOLN
INTRAMUSCULAR | Status: AC
  Filled 2020-10-26: qty 2

## 2020-10-26 MED ORDER — SCOPOLAMINE 1 MG/3DAYS TD PT72
MEDICATED_PATCH | TRANSDERMAL | Status: AC
  Filled 2020-10-26: qty 1

## 2020-10-26 MED ORDER — GABAPENTIN 300 MG PO CAPS
ORAL_CAPSULE | ORAL | Status: AC
  Filled 2020-10-26: qty 1

## 2020-10-26 MED ORDER — CELECOXIB 200 MG PO CAPS
ORAL_CAPSULE | ORAL | Status: AC
  Filled 2020-10-26: qty 2

## 2020-10-26 MED ORDER — ACETIC ACID 5 % SOLN
Status: DC | PRN
  Administered 2020-10-26: 1 via TOPICAL

## 2020-10-26 MED ORDER — MIDAZOLAM HCL 2 MG/2ML IJ SOLN
INTRAMUSCULAR | Status: DC | PRN
  Administered 2020-10-26: 2 mg via INTRAVENOUS

## 2020-10-26 MED ORDER — FENTANYL CITRATE (PF) 100 MCG/2ML IJ SOLN
INTRAMUSCULAR | Status: DC | PRN
  Administered 2020-10-26: 50 ug via INTRAVENOUS

## 2020-10-26 MED ORDER — PROPOFOL 10 MG/ML IV BOLUS
INTRAVENOUS | Status: DC | PRN
  Administered 2020-10-26: 150 mg via INTRAVENOUS

## 2020-10-26 MED ORDER — IODINE STRONG (LUGOLS) 5 % PO SOLN
ORAL | Status: DC | PRN
  Administered 2020-10-26: 5 mL

## 2020-10-26 MED ORDER — LACTATED RINGERS IV SOLN: INTRAVENOUS | Status: DC

## 2020-10-26 MED ORDER — ONDANSETRON HCL 4 MG/2ML IJ SOLN
INTRAMUSCULAR | Status: DC | PRN
  Administered 2020-10-26: 4 mg via INTRAVENOUS

## 2020-10-26 MED ORDER — ESTRADIOL 0.1 MG/GM VA CREA
TOPICAL_CREAM | VAGINAL | Status: DC | PRN
  Administered 2020-10-26: 1 via VAGINAL

## 2020-10-26 MED ORDER — CELECOXIB 200 MG PO CAPS
400.0000 mg | ORAL_CAPSULE | ORAL | Status: AC
  Administered 2020-10-26: 400 mg via ORAL

## 2020-10-26 MED ORDER — LIDOCAINE 2% (20 MG/ML) 5 ML SYRINGE
INTRAMUSCULAR | Status: AC
  Filled 2020-10-26: qty 5

## 2020-10-26 MED ORDER — SCOPOLAMINE 1 MG/3DAYS TD PT72
1.0000 | MEDICATED_PATCH | TRANSDERMAL | Status: DC
  Administered 2020-10-26: 1.5 mg via TRANSDERMAL

## 2020-10-26 MED ORDER — PROMETHAZINE HCL 25 MG/ML IJ SOLN: 6.2500 mg | INTRAMUSCULAR | Status: DC | PRN

## 2020-10-26 MED ORDER — ACETAMINOPHEN 500 MG PO TABS
ORAL_TABLET | ORAL | Status: AC
  Filled 2020-10-26: qty 2

## 2020-10-26 SURGICAL SUPPLY — 29 items
BLADE SURG 15 STRL LF DISP TIS (BLADE) ×2 IMPLANT
BLADE SURG 15 STRL SS (BLADE) ×3
CATH ROBINSON RED A/P 16FR (CATHETERS) ×3 IMPLANT
COVER WAND RF STERILE (DRAPES) ×3 IMPLANT
DEPRESSOR TONGUE BLADE STERILE (MISCELLANEOUS) ×3 IMPLANT
GAUZE 4X4 16PLY RFD (DISPOSABLE) ×3 IMPLANT
GLOVE SURG ENC MOIS LTX SZ6 (GLOVE) ×6 IMPLANT
GLOVE SURG ENC MOIS LTX SZ6.5 (GLOVE) ×3 IMPLANT
GLOVE SURG UNDER POLY LF SZ7 (GLOVE) ×6 IMPLANT
GOWN STRL REUS W/TWL LRG LVL3 (GOWN DISPOSABLE) ×12 IMPLANT
KIT TURNOVER CYSTO (KITS) ×3 IMPLANT
MANIFOLD NEPTUNE II (INSTRUMENTS) ×3 IMPLANT
NEEDLE HYPO 25X1 1.5 SAFETY (NEEDLE) ×3 IMPLANT
NS IRRIG 500ML POUR BTL (IV SOLUTION) ×3 IMPLANT
PACK PERINEAL COLD (PAD) ×3 IMPLANT
PACK VAGINAL WOMENS (CUSTOM PROCEDURE TRAY) ×3 IMPLANT
PAD PREP 24X48 CUFFED NSTRL (MISCELLANEOUS) ×3 IMPLANT
SUT VIC AB 0 SH 27 (SUTURE) ×3 IMPLANT
SUT VIC AB 3-0 SH 27 (SUTURE) ×3
SUT VIC AB 3-0 SH 27X BRD (SUTURE) ×2 IMPLANT
SUT VIC AB 4-0 PS2 18 (SUTURE) ×3 IMPLANT
SUT VICRYL 0 UR6 27IN ABS (SUTURE) IMPLANT
SWAB OB GYN 8IN STERILE 2PK (MISCELLANEOUS) ×6 IMPLANT
SYR BULB EAR ULCER 3OZ GRN STR (SYRINGE) ×3 IMPLANT
SYR BULB IRRIG 60ML STRL (SYRINGE) ×3 IMPLANT
TOWEL OR 17X26 10 PK STRL BLUE (TOWEL DISPOSABLE) ×3 IMPLANT
TUBE CONNECTING 12X1/4 (SUCTIONS) ×3 IMPLANT
VACUUM HOSE/TUBING 7/8INX6FT (MISCELLANEOUS) ×3 IMPLANT
WATER STERILE IRR 500ML POUR (IV SOLUTION) ×3 IMPLANT

## 2020-10-26 NOTE — Transfer of Care (Addendum)
Immediate Anesthesia Transfer of Care Note  Patient: Maria Velasquez  Procedure(s) Performed: Procedure(s) (LRB): CO2 LASER APPLICATION OF VAGINA (N/A) VAGINAL BIOPSY (N/A)  Patient Location: PACU  Anesthesia Type: General  Level of Consciousness: awake, alert  and oriented  Airway & Oxygen Therapy: Patient Spontanous Breathing and Patient connected to nasal cannula oxygen  Post-op Assessment: Report given to PACU RN and Post -op Vital signs reviewed and stable  Post vital signs: Reviewed and stable  Complications: No apparent anesthesia complications  Last Vitals: BP 96/51 HR 63 O2Sat 100% RR 14 at 1452 10/26/20 Vitals Value Taken Time  BP    Temp    Pulse    Resp    SpO2      Last Pain:  Vitals:   10/26/20 1114  TempSrc: Oral  PainSc: 0-No pain      Patients Stated Pain Goal: 5 (02/54/27 0623)  Complications: No complications documented.

## 2020-10-26 NOTE — Interval H&P Note (Signed)
History and Physical Interval Note:  10/26/2020 10:51 AM  Maria Velasquez  has presented today for surgery, with the diagnosis of VAGINAL DYSPLACIA.  The various methods of treatment have been discussed with the patient and family. After consideration of risks, benefits and other options for treatment, the patient has consented to  Procedure(s): CO2 LASER APPLICATION OF VAGINA (N/A) WIDE EXCISION VAGINA (N/A) VAGINA BIOPSY (N/A) as a surgical intervention.  The patient's history has been reviewed, patient examined, no change in status, stable for surgery.  I have reviewed the patient's chart and labs.  Questions were answered to the patient's satisfaction.     Lafonda Mosses

## 2020-10-26 NOTE — Anesthesia Preprocedure Evaluation (Addendum)
Anesthesia Evaluation  Patient identified by MRN, date of birth, ID band Patient awake    Reviewed: Allergy & Precautions, NPO status , Patient's Chart, lab work & pertinent test results  History of Anesthesia Complications Negative for: history of anesthetic complications  Airway Mallampati: II  TM Distance: >3 FB Neck ROM: Full    Dental no notable dental hx.    Pulmonary neg pulmonary ROS,    Pulmonary exam normal        Cardiovascular negative cardio ROS Normal cardiovascular exam     Neuro/Psych  Headaches, negative psych ROS   GI/Hepatic negative GI ROS, Neg liver ROS,   Endo/Other  negative endocrine ROS  Renal/GU negative Renal ROS   Vaginal dysplasia    Musculoskeletal negative musculoskeletal ROS (+)   Abdominal   Peds  Hematology negative hematology ROS (+)   Anesthesia Other Findings Day of surgery medications reviewed with patient.  Reproductive/Obstetrics negative OB ROS                            Anesthesia Physical Anesthesia Plan  ASA: II  Anesthesia Plan: General   Post-op Pain Management:    Induction: Intravenous  PONV Risk Score and Plan: 4 or greater and Treatment may vary due to age or medical condition, Ondansetron, Dexamethasone, Midazolam and Scopolamine patch - Pre-op  Airway Management Planned: LMA  Additional Equipment: None  Intra-op Plan:   Post-operative Plan: Extubation in OR  Informed Consent: I have reviewed the patients History and Physical, chart, labs and discussed the procedure including the risks, benefits and alternatives for the proposed anesthesia with the patient or authorized representative who has indicated his/her understanding and acceptance.     Dental advisory given  Plan Discussed with: CRNA  Anesthesia Plan Comments:       Anesthesia Quick Evaluation

## 2020-10-26 NOTE — Discharge Instructions (Addendum)
10/26/2020  Post Anesthesia Home Care Instructions  Activity: Get plenty of rest for the remainder of the day. A responsible individual must stay with you for 24 hours following the procedure.  For the next 24 hours, DO NOT: -Drive a car -Paediatric nurse -Drink alcoholic beverages -Take any medication unless instructed by your physician -Make any legal decisions or sign important papers.  Meals: Start with liquid foods such as gelatin or soup. Progress to regular foods as tolerated. Avoid greasy, spicy, heavy foods. If nausea and/or vomiting occur, drink only clear liquids until the nausea and/or vomiting subsides. Call your physician if vomiting continues.  Special Instructions/Symptoms: Your throat may feel dry or sore from the anesthesia or the breathing tube placed in your throat during surgery. If this causes discomfort, gargle with warm salt water. The discomfort should disappear within 24 hours.  If you had a scopolamine patch placed behind your ear for the management of post- operative nausea and/or vomiting:  1. The medication in the patch is effective for 72 hours, after which it should be removed.  Wrap patch in a tissue and discard in the trash. Wash hands thoroughly with soap and water. 2. You may remove the patch earlier than 72 hours if you experience unpleasant side effects which may include dry mouth, dizziness or visual disturbances. 3. Avoid touching the patch. Wash your hands with soap and water after contact with the patch.     Return to work: 1-2 days if applicable  Activity: 1. Be up and out of the bed during the day.  Take a nap if needed.  You may walk up steps but be careful and use the hand rail.  Stair climbing will tire you more than you think, you may need to stop part way and rest.   2. No driving for minimum of 24 hours after the procedure.  Make sure your reaction time has returned prior to driving.  3. Shower daily. No tub baths until cleared by your  surgeon.   4. No sexual activity and nothing in the vagina for minimum of 4 weeks.  5. You may experience vaginal spotting and/or discharge after surgery.  The spotting is normal but if you experience heavy bleeding, call our office.  6. Take Tylenol or ibuprofen for pain.  Monitor your Tylenol intake to a max of 4,000 mg in a 24 hour period.  Diet: 1. Low sodium Heart Healthy Diet is recommended.  2. It is safe to use a laxative, such as Miralax or Colace, if you have difficulty moving your bowels. You can take Sennakot at bedtime every evening to keep bowel movements regular and to prevent constipation.    Wound Care: 1. Keep clean and dry.  Shower daily.  Reasons to call the Doctor:  Fever - Oral temperature greater than 100.4 degrees Fahrenheit  Foul-smelling vaginal discharge  Difficulty urinating  Nausea and vomiting  Difficulty breathing with or without chest pain  New calf pain especially if only on one side  Sudden, continuing increased vaginal bleeding with or without clots.   Contacts: For questions or concerns you should contact:  Dr. Jeral Pinch at [phone]  Joylene John, NP at [phone]  After Hours: call [phone] and have the GYN Oncologist paged/contacted

## 2020-10-26 NOTE — Anesthesia Procedure Notes (Signed)
Procedure Name: LMA Insertion Date/Time: 10/26/2020 2:09 PM Performed by: Bonney Aid, CRNA Pre-anesthesia Checklist: Patient identified, Emergency Drugs available, Suction available and Patient being monitored Patient Re-evaluated:Patient Re-evaluated prior to induction Oxygen Delivery Method: Circle system utilized Preoxygenation: Pre-oxygenation with 100% oxygen Induction Type: IV induction Ventilation: Mask ventilation without difficulty LMA: LMA inserted LMA Size: 4.0 Number of attempts: 1 Airway Equipment and Method: Bite block Placement Confirmation: positive ETCO2 Tube secured with: Tape Dental Injury: Teeth and Oropharynx as per pre-operative assessment

## 2020-10-26 NOTE — Op Note (Signed)
PATIENT: Maria Velasquez DATE: 10/26/20  Preop Diagnosis: Vaginal dysplasia  Postoperative Diagnosis: Same as above  Surgery: CO2 laser of the vagina  Surgeons:  Valarie Cones, MD  Assistant: Joylene John NP  Anesthesia: General   Estimated blood loss: <10 ml  IVF:  See I&O flowsheet   Urine output: n/a   Complications: None apparent  Pathology: vaginal cuff biopsy (right)  Operative findings: No nodularity or masses on EUA. Prior biopsy site along the cuff noted to be healing well. Area of decreased uptake after Lugols application along right vaginal cuff, left upper vaginal sidewall and very apex of right vaginal sidewall. No findings c/w invasive disease. Given multifocal disease, decision made to biopsy most concerning area (right aspect of vaginal cuff near apex) and laser areas of decreased uptake.  Procedure: The patient was identified in the preoperative holding area. Informed consent was signed on the chart. Patient was seen history was reviewed and exam was performed.   The patient was then taken to the operating room and placed in the supine position with SCD hose on. General anesthesia was then induced without difficulty. She was then placed in the dorsolithotomy position. The perineum was prepped with Betadine. The vagina was prepped with Betadine. The patient was then draped after the prep was dried.   Timeout was performed the patient, procedure, antibiotic, allergy, and length of procedure. A speculum was placed in the vagina. 10cc of 0.25% marcaine was injected subcuticular at the cuff for local anesthesia. Lugols was then applied to the cuff and upper vagina with findings noted above. Biopsy taken with Tischler forceps.  The patient's surgical field was draped with wet towels. The staff and patient ensured laser-safe eyewear and masks were fitted. The laser was set to 8 watts continuous. The laser was tested for accuracy on a tongue depressor.  The laser  was applied to the circumscribed areas of the vagina that had been previously identified. The tissue was ablated to the desired depth and the eschar was removed with a moistened sponge. When the entire lesions had been ablated the procedure was complete.  Vaginal estrogen cream was applied to treated areas..  All instrument, suture, laparotomy, Ray-Tec, and needle counts were correct x2. The patient tolerated the procedure well and was taken recovery room in stable condition.   Jeral Pinch MD Gynecologic Oncology

## 2020-10-27 ENCOUNTER — Encounter (HOSPITAL_BASED_OUTPATIENT_CLINIC_OR_DEPARTMENT_OTHER): Payer: Self-pay | Admitting: Gynecologic Oncology

## 2020-10-27 LAB — SURGICAL PATHOLOGY

## 2020-10-28 NOTE — Anesthesia Postprocedure Evaluation (Signed)
Anesthesia Post Note  Patient: Maria Velasquez  Procedure(s) Performed: CO2 LASER APPLICATION OF VAGINA (N/A Vagina ) VAGINAL BIOPSY (N/A Vagina )     Patient location during evaluation: PACU Anesthesia Type: General Level of consciousness: awake and alert Pain management: pain level controlled Vital Signs Assessment: post-procedure vital signs reviewed and stable Respiratory status: spontaneous breathing, nonlabored ventilation, respiratory function stable and patient connected to nasal cannula oxygen Cardiovascular status: blood pressure returned to baseline and stable Postop Assessment: no apparent nausea or vomiting Anesthetic complications: no   No complications documented.  Last Vitals:  Vitals:   10/26/20 1540 10/26/20 1600  BP: 111/78 127/80  Pulse:  (!) 54  Resp: 14 16  Temp:  36.6 C  SpO2:  100%    Last Pain:  Vitals:   10/27/20 1248  TempSrc:   PainSc: 0-No pain                 Effie Berkshire

## 2020-11-02 ENCOUNTER — Encounter: Payer: Self-pay | Admitting: Gynecologic Oncology

## 2020-11-02 ENCOUNTER — Telehealth: Payer: Self-pay

## 2020-11-02 NOTE — Telephone Encounter (Signed)
F/U with Maria Velasquez since she did not call back this afternoon.  She has had increased cramping today. Took 2 Aleve as she did not have the ibuprofen with her at work. The Aleve had minimal effect on cramping.   She just got home and she stated the blood is red. She soaked through 2 more liners.  It takes about 3 hour to saturate the liner. She will take the 800 mg Ibuprofen and apply a heating pad and get thicker pads. She states that the odor is foul.  She did fine a thermometer and her temperature as normal.  She will come in to see Dr. Berline Lopes at [facility] to be evaluated.

## 2020-11-02 NOTE — Telephone Encounter (Signed)
Maria Velasquez states that she showered this am and put a new liner in her panties.  The liner is saturated with moderate red blood. Pt states that she applies the estrace cream with the applicator every other night.   Told her that Melissa Cross,NP said to apply a dime size amount of the estrace cream with her finger not the applicator.  The applicator may be irritating the lasered wound site. Told her to monitor the bleeding and call this afternoon with an update.  She may need to come in tomorrow to be evaluated.

## 2020-11-03 ENCOUNTER — Other Ambulatory Visit: Payer: Self-pay

## 2020-11-03 ENCOUNTER — Encounter: Payer: Self-pay | Admitting: Gynecologic Oncology

## 2020-11-03 ENCOUNTER — Inpatient Hospital Stay: Payer: Commercial Managed Care - PPO | Attending: Gynecologic Oncology | Admitting: Gynecologic Oncology

## 2020-11-03 VITALS — BP 147/96 | HR 86 | Temp 97.5°F | Resp 17 | Ht 63.0 in | Wt 177.8 lb

## 2020-11-03 DIAGNOSIS — D072 Carcinoma in situ of vagina: Secondary | ICD-10-CM | POA: Diagnosis not present

## 2020-11-03 DIAGNOSIS — N939 Abnormal uterine and vaginal bleeding, unspecified: Secondary | ICD-10-CM | POA: Diagnosis not present

## 2020-11-03 DIAGNOSIS — Z9889 Other specified postprocedural states: Secondary | ICD-10-CM | POA: Insufficient documentation

## 2020-11-03 NOTE — Progress Notes (Signed)
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  She notes having very minimal spotting versus vaginal discharge until yesterday.  Yesterday, she had heavier bleeding that required the use of pantiliners.  She change them multiple times and they were anywhere from half to two thirds saturated.  She denies any pain but had some cramping yesterday and today.  She endorses a good appetite without nausea or emesis.  She reports normal bowel and bladder function.  Past Medical/Surgical History: Past Medical History:  Diagnosis Date  . Headache    otc med prn  . History of cervical dysplasia   . History of uterine fibroid   . Lipoma   . Vaginal dysplasia     Past Surgical History:  Procedure Laterality Date  . CO2 LASER APPLICATION N/A 4/0/9811   Procedure: CO2 LASER APPLICATION OF VAGINA;  Surgeon: Lafonda Mosses, MD;  Location: [facility];  Service: Gynecology;  Laterality: N/A;  . DILATION AND CURETTAGE OF UTERUS  08/05/2009  @[facility]    missed abortion  . LAPAROSCOPIC SALPINGO OOPHERECTOMY Right 08-28-2018  @[facility]    ovarian torsion  . LAPAROSCOPIC TUBAL LIGATION W/ FILCHIE CLIPS Bilateral 03-20-2010  @[facility]   . LUMBAR DISC SURGERY  01-30-2007  @[facility]    L4-- L5  . ROBOTIC ASSISTED BILATERAL SALPINGO OOPHERECTOMY Bilateral 11/16/2014   Procedure: ROBOTIC ASSISTED BILATERAL SALPINGECTOMY;  Surgeon: Bobbye Charleston, MD;  Location: [facility];  Service: Gynecology;  Laterality: Bilateral;  . ROBOTIC ASSISTED TOTAL HYSTERECTOMY N/A 11/16/2014   w/ BS  . VULVA Milagros Loll BIOPSY N/A 10/26/2020   Procedure: VAGINAL BIOPSY;  Surgeon: Lafonda Mosses, MD;  Location: [facility];  Service: Gynecology;   Laterality: N/A;  . WISDOM TOOTH EXTRACTION  1997    Family History  Problem Relation Age of Onset  . Breast cancer Mother   . Pancreatic cancer Maternal Grandfather   . Colon cancer Paternal Grandfather     Social History   Socioeconomic History  . Marital status: Married    Spouse name: Not on file  . Number of children: Not on file  . Years of education: Not on file  . Highest education level: Not on file  Occupational History  . Not on file  Tobacco Use  . Smoking status: Never Smoker  . Smokeless tobacco: Never Used  Vaping Use  . Vaping Use: Never used  Substance and Sexual Activity  . Alcohol use: Yes    Comment: occasional  . Drug use: Never  . Sexual activity: Not Currently    Birth control/protection: Surgical    Comment: hyst  Other Topics Concern  . Not on file  Social History Narrative  . Not on file   Social Determinants of Health   Financial Resource Strain: Not on file  Food Insecurity: Not on file  Transportation Needs: Not on file  Physical Activity: Not on file  Stress: Not on file  Social Connections: Not on file    Current Medications:  Current Outpatient Medications:  .  ALPRAZolam (XANAX) 0.5 MG tablet, Take 0.5 mg by mouth daily as needed. for anxiety, Disp: , Rfl:  .  ibuprofen (ADVIL) 800 MG tablet, Take 1 tablet (800 mg total) by mouth every 8 (eight) hours as needed for moderate pain. For AFTER surgery,  Disp: 30 tablet, Rfl: 1 .  loratadine (CLARITIN) 10 MG tablet, Take 10 mg by mouth daily., Disp: , Rfl:   Review of Systems: + chills, pelvic pain, vaginal bleeding muscle pain/cramps Denies appetite changes, fatigue, unexplained weight changes. Denies hearing loss, neck lumps or masses, mouth sores, ringing in ears or voice changes. Denies cough or wheezing.  Denies shortness of breath. Denies chest pain or palpitations. Denies leg swelling. Denies abdominal distention, pain, blood in stools, constipation, diarrhea, nausea,  vomiting, or early satiety. Denies pain with intercourse, dysuria, frequency, hematuria or incontinence. Denies hot flashes.   Denies joint pain, back pain. Denies itching, rash, or wounds. Denies dizziness, headaches, numbness or seizures. Denies swollen lymph nodes or glands, denies easy bruising or bleeding. Denies anxiety, depression, confusion, or decreased concentration.  Physical Exam: BP (!) 147/96 (BP Location: Right Arm, Patient Position: Sitting)   Pulse 86   Temp (!) 97.5 F (36.4 C)   Resp 17   Ht 5\' 3"  (1.6 m)   Wt 177 lb 12.8 oz (80.6 kg)   LMP 10/19/2014 (Approximate)   BMI 31.50 kg/m  General: Alert, oriented, no acute distress. HEENT: Normocephalic, atraumatic, sclera anicteric. Chest: Unlabored breathing on room air. Extremities: Grossly normal range of motion.  Warm, well perfused.  No edema bilaterally. Skin: No rashes or lesions noted. GU: Normal appearing external genitalia without erythema, excoriation, or lesions.  Speculum exam reveals scant discharge, vaginal apex with pinpoint area with some oozing.  This was treated with silver nitrate.  Otherwise findings in line with recent laser.    Laboratory & Radiologic Studies: None new  Assessment & Plan: Maria Velasquez is a 45 y.o. woman with high-grade vaginal dysplasia status post biopsies and vaginal laser ablation approximately 1 week ago presenting with vaginal bleeding.  Patient has had relatively little bleeding today compared to yesterday.  On exam, there is an area that I suspect was the source of her bleeding.  This was treated with silver nitrate.  Otherwise, I think she is meeting milestones and tissue appears to be healing appropriately.  I will have some a from the office check in with her on Monday.  We discussed precautions over the weekend that should prompt coming in (only heavy bleeding like a menses).  Otherwise, if the patient were to have recurrence of bleeding, we will plan to see her in  clinic early next week.  28 minutes of total time was spent for this patient encounter, including preparation, face-to-face counseling with the patient and coordination of care, and documentation of the encounter.  Jeral Pinch, MD  Division of Gynecologic Oncology  Department of Obstetrics and Gynecology  [facility]

## 2020-11-03 NOTE — Patient Instructions (Signed)
Everything appears to be healing well.  I think you are bleeding from one small area and I used some medication, called silver nitrate, to hopefully stop the bleeding.  As long as you are not having heavy vaginal bleeding, you can watch this over the weekend.  Hopefully your bleeding will be very light or stop completely.  We will call you on Monday to check in, and I can only see you back if you start having more bleeding.

## 2020-11-06 ENCOUNTER — Telehealth: Payer: Self-pay | Admitting: Oncology

## 2020-11-06 NOTE — Telephone Encounter (Signed)
Called Bo and she said she is doing much better.  She hasn't had any bleeding since Saturday night.  She denies having any cramping, bowel/bladder issues or a fever.  The only thing she has noticed is that her breasts are sore and is wondering if that is somehow connected to the bleeding.

## 2020-11-07 NOTE — Telephone Encounter (Signed)
Called Maria Velasquez and advised her of message from Dr. Berline Lopes.  She verbalized understanding.

## 2020-11-21 ENCOUNTER — Encounter: Payer: Self-pay | Admitting: Gynecologic Oncology

## 2020-11-23 ENCOUNTER — Encounter: Payer: Commercial Managed Care - PPO | Admitting: Gynecologic Oncology

## 2020-11-23 ENCOUNTER — Inpatient Hospital Stay: Payer: Commercial Managed Care - PPO | Attending: Gynecologic Oncology | Admitting: Gynecologic Oncology

## 2020-11-23 ENCOUNTER — Other Ambulatory Visit: Payer: Self-pay

## 2020-11-23 ENCOUNTER — Encounter: Payer: Self-pay | Admitting: Gynecologic Oncology

## 2020-11-23 VITALS — HR 66 | Temp 98.4°F | Resp 16 | Wt 174.0 lb

## 2020-11-23 DIAGNOSIS — Z90722 Acquired absence of ovaries, bilateral: Secondary | ICD-10-CM

## 2020-11-23 DIAGNOSIS — N893 Dysplasia of vagina, unspecified: Secondary | ICD-10-CM

## 2020-11-23 DIAGNOSIS — Z90710 Acquired absence of both cervix and uterus: Secondary | ICD-10-CM

## 2020-11-23 DIAGNOSIS — N939 Abnormal uterine and vaginal bleeding, unspecified: Secondary | ICD-10-CM

## 2020-11-23 NOTE — Patient Instructions (Signed)
You are healing well from surgery!  I will send my recommendations to your gynecologist, but I am going to recommend that you have an exam every 6 months initially with a Pap smear and testing for HPV yearly.  If these remain normal, your visits and testing can be spaced out.

## 2020-11-23 NOTE — Progress Notes (Signed)
Gynecologic Oncology Return Clinic Visit  11/23/20  Reason for Visit: post-op follow-up  Treatment History: 10/26/2020: Exam under anesthesia, vaginal cuff biopsies, CO2 laser ablation of the vagina for high-grade vaginal dysplasia 11/03/20: seen after surgery with vaginal bleeding. Healing tissue noted to be oozing at vaginal cuff and treated with silver nitrate.  Interval History: Patient presents today for follow-up.  She reports doing well since her last visit with me.  She had a little bit of vaginal spotting for several days after I saw her on the 13th, denies any further bleeding.  Reports baseline vaginal discharge.  Denies any change to bowel or bladder function.  Reports good appetite without nausea or emesis.  Patient's sister got married last weekend.  Past Medical/Surgical History: Past Medical History:  Diagnosis Date  . Headache    otc med prn  . History of cervical dysplasia   . History of uterine fibroid   . Lipoma   . Vaginal dysplasia     Past Surgical History:  Procedure Laterality Date  . CO2 LASER APPLICATION N/A 7/0/1410   Procedure: CO2 LASER APPLICATION OF VAGINA;  Surgeon: Lafonda Mosses, MD;  Location: [facility];  Service: Gynecology;  Laterality: N/A;  . DILATION AND CURETTAGE OF UTERUS  08/05/2009  @[facility]    missed abortion  . LAPAROSCOPIC SALPINGO OOPHERECTOMY Right 08-28-2018  @[facility]    ovarian torsion  . LAPAROSCOPIC TUBAL LIGATION W/ FILCHIE CLIPS Bilateral 03-20-2010  @[facility]   . LUMBAR DISC SURGERY  01-30-2007  @[facility]    L4-- L5  . ROBOTIC ASSISTED BILATERAL SALPINGO OOPHERECTOMY Bilateral 11/16/2014   Procedure: ROBOTIC ASSISTED BILATERAL SALPINGECTOMY;  Surgeon: Bobbye Charleston, MD;  Location: [facility];  Service: Gynecology;  Laterality: Bilateral;  . ROBOTIC ASSISTED TOTAL HYSTERECTOMY N/A 11/16/2014   w/ BS  . VULVA Milagros Loll BIOPSY N/A 10/26/2020   Procedure: VAGINAL BIOPSY;  Surgeon: Lafonda Mosses, MD;  Location: [facility];  Service: Gynecology;  Laterality: N/A;  . WISDOM TOOTH EXTRACTION  1997    Family History  Problem Relation Age of Onset  . Breast cancer Mother   . Pancreatic cancer Maternal Grandfather   . Colon cancer Paternal Grandfather     Social History   Socioeconomic History  . Marital status: Married    Spouse name: Not on file  . Number of children: Not on file  . Years of education: Not on file  . Highest education level: Not on file  Occupational History  . Not on file  Tobacco Use  . Smoking status: Never Smoker  . Smokeless tobacco: Never Used  Vaping Use  . Vaping Use: Never used  Substance and Sexual Activity  . Alcohol use: Yes    Comment: occasional  . Drug use: Never  . Sexual activity: Not Currently    Birth control/protection: Surgical    Comment: hyst  Other Topics Concern  . Not on file  Social History Narrative  . Not on file   Social Determinants of Health   Financial Resource Strain: Not on file  Food Insecurity: Not on file  Transportation Needs: Not on file  Physical Activity: Not on file  Stress: Not on file  Social Connections: Not on file    Current Medications:  Current Outpatient Medications:  .  ibuprofen (ADVIL) 800 MG tablet, Take 1 tablet (800 mg total) by mouth every 8 (eight) hours as needed for moderate pain. For AFTER surgery, Disp: 30 tablet, Rfl: 1 .  loratadine (CLARITIN) 10 MG tablet,  Take 10 mg by mouth daily., Disp: , Rfl:  .  ALPRAZolam (XANAX) 0.5 MG tablet, Take 0.5 mg by mouth daily as needed. for anxiety (Patient not taking: No sig reported), Disp: , Rfl:   Review of Systems: Denies appetite changes, fevers, chills, fatigue, unexplained weight changes. Denies hearing loss, neck lumps or masses, mouth sores, ringing in ears or voice changes. Denies cough or wheezing.  Denies shortness of breath. Denies chest pain or palpitations. Denies leg swelling. Denies abdominal distention, pain, blood in stools,  constipation, diarrhea, nausea, vomiting, or early satiety. Denies pain with intercourse, dysuria, frequency, hematuria or incontinence. Denies hot flashes, pelvic pain, vaginal bleeding or vaginal discharge.   Denies joint pain, back pain or muscle pain/cramps. Denies itching, rash, or wounds. Denies dizziness, headaches, numbness or seizures. Denies swollen lymph nodes or glands, denies easy bruising or bleeding. Denies anxiety, depression, confusion, or decreased concentration.  Physical Exam: Pulse 66   Temp 98.4 F (36.9 C) (Oral)   Resp 16   Wt 174 lb (78.9 kg)   LMP 10/19/2014 (Approximate)   SpO2 100% Comment: RA  BMI 30.82 kg/m  General: Alert, oriented, no acute distress. HEENT: Normocephalic, atraumatic, sclera anicteric. Chest: Unlabored breathing on room air. Abdomen: soft, nontender.  Normoactive bowel sounds.  No masses or hepatosplenomegaly appreciated.   Extremities: Grossly normal range of motion.  Warm, well perfused.  No edema bilaterally. Skin: No rashes or lesions noted. GU: Normal appearing external genitalia without erythema, excoriation, or lesions.  Speculum exam reveals moderate discomfort with insertion of speculum.  Apex of the vagina is healing well, with no discrete lesions noted, physiologic discharge.    Laboratory & Radiologic Studies: None new  Assessment & Plan: Maria Velasquez is a 45 y.o. woman s/p vaginal laser ablation for high-grade vaginal dysplasia.  Patient is doing very well from a postoperative standpoint and meeting milestones.  We reviewed expectations for continued healing.  Given risk of recurrence, we discussed plan for close follow-up. I would recommend an exam every 6 months initially with cotesting (vaginal pap and HPV testing) every year for two years followed by yearly exam and cotesting. We discussed that if she were to have any vaginal bleeding, discharge, or other new symptoms, this should prompt a phone call to schedule a  follow-up sooner than her next scheduled visit.  26 minutes of total time was spent for this patient encounter, including preparation, face-to-face counseling with the patient and coordination of care, and documentation of the encounter.  Jeral Pinch, MD  Division of Gynecologic Oncology  Department of Obstetrics and Gynecology  [facility]

## 2020-12-30 NOTE — Progress Notes (Signed)
Referring Provider:Horvath, Sharyn Lull, MD Primary Care Physician:  [facility] Primary Gastroenterologist:  Dr. Abbey Chatters  Chief Complaint  Patient presents with   Abdominal Pain    Approx 4 months. Pain started left side and has moved to abd and lately has noticed right upper abd    HPI:   Maria Velasquez is a 45 y.o. female presenting today at the request of Bobbye Charleston, MD for LLQ pain.   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 Recently underwent laser ablation of vaginal dysphasia on 10/26/20. Biopsy was taken at that time of most concerning area. Also with history of right oophrectomy due to ovarian torsion in 2020. Left ovary in situ.   Around Christmas, she started noticing left flank/back pain after eating. This moved to LUQ, epigastric, and RUQ, but still most frequently in LUQ. Feels like she gets a knot in LUQ. Can see a bulge at times. Resolves within about 1 hour. No nausea or vomiting. No gassiness. No constipation or diarrhea. Bms every 2 days at baseline. Had [place] Saturday night. This was the last occurrence. Symptoms occur at least once a week. Triggered by fried/fatty foods and wine. Lost 20 lbs with weight watchers since January.   GERD well controlled on omeprazole daily. No dysphagia.   Rare ibuprofen. Uses tylenol for HA.   No brbpr or melena.   No prior colonoscopy or EGD.   Past Medical History:  Diagnosis Date   Headache    otc med prn   History of cervical dysplasia    History of uterine fibroid    Lipoma    Vaginal dysplasia     Past Surgical History:  Procedure Laterality Date   CO2 LASER APPLICATION N/A 11/26/7844   Procedure: CO2 LASER APPLICATION OF VAGINA;  Surgeon: Lafonda Mosses, MD;  Location: [facility];  Service: Gynecology;  Laterality: N/A;   DILATION AND CURETTAGE OF UTERUS  08/05/2009  @[facility]    missed abortion   LAPAROSCOPIC  SALPINGO OOPHERECTOMY Right 08-28-2018  @[facility]    ovarian torsion   LAPAROSCOPIC TUBAL LIGATION W/ FILCHIE CLIPS Bilateral 03-20-2010  @[facility]    LUMBAR DISC SURGERY  01-30-2007  @[facility]    L4-- L5   ROBOTIC ASSISTED BILATERAL SALPINGO OOPHERECTOMY Bilateral 11/16/2014   Procedure: ROBOTIC ASSISTED BILATERAL SALPINGECTOMY;  Surgeon: Bobbye Charleston, MD;  Location: [facility];  Service: Gynecology;  Laterality: Bilateral;   ROBOTIC ASSISTED TOTAL HYSTERECTOMY N/A 11/16/2014   w/ BS   VULVA Milagros Loll BIOPSY N/A 10/26/2020   Procedure: VAGINAL BIOPSY;  Surgeon: Lafonda Mosses, MD;  Location: [facility];  Service: Gynecology;  Laterality: N/A;   WISDOM TOOTH EXTRACTION  1997    Current Outpatient Medications  Medication Sig Dispense Refill   ALPRAZolam (XANAX) 0.5 MG tablet Take 0.5 mg by mouth daily as needed. for anxiety     ibuprofen (ADVIL) 800 MG tablet Take 1 tablet (800 mg total) by mouth every 8 (eight) hours as needed for moderate pain. For AFTER surgery 30 tablet 1   loratadine (CLARITIN) 10 MG tablet Take 10 mg by mouth daily as needed.     omeprazole (PRILOSEC) 20 MG capsule Take 20 mg by mouth daily.     No current facility-administered medications for this visit.    Allergies as of 01/01/2021 - Review Complete 01/01/2021  Allergen Reaction Noted   Ciprofloxacin  10/04/2020   Oxycodone Hives, Itching, and Other (See Comments) 11/02/2014  Prednisone Other (See Comments) 08/28/2018    Family History  Problem Relation Age of Onset   Breast cancer Mother    Pancreatic cancer Maternal Grandfather    Colon cancer Paternal Grandfather     Social History   Socioeconomic History   Marital status: Married    Spouse name: Not on file   Number of children: Not on file   Years of education: Not on file   Highest education level: Not on file  Occupational History   Not on file  Tobacco Use   Smoking status: Never   Smokeless tobacco: Never  Vaping Use   Vaping Use:  Never used  Substance and Sexual Activity   Alcohol use: Yes    Comment: occasional   Drug use: Never   Sexual activity: Not Currently    Birth control/protection: Surgical    Comment: hyst  Other Topics Concern   Not on file  Social History Narrative   Not on file   Social Determinants of Health   Financial Resource Strain: Not on file  Food Insecurity: Not on file  Transportation Needs: Not on file  Physical Activity: Not on file  Stress: Not on file  Social Connections: Not on file  Intimate Partner Violence: Not on file    Review of Systems: Gen: Denies any fever, chills, cold or flulike symptoms, lightheadedness, dizziness, presyncope, syncope. CV: Denies chest pain or palpitations. Resp: Denies shortness of breath or cough. GI: See HPI GU : Denies urinary burning, urinary frequency, urinary hesitancy MS: Denies joint pain  Derm: Denies rash Psych: Denies depression or anxiety Heme: See HPI  Physical Exam: BP 132/83   Pulse 69   Temp 97.7 F (36.5 C) (Temporal)   Ht 5\' 3"  (1.6 m)   Wt 173 lb 9.6 oz (78.7 kg)   LMP 10/19/2014 (Approximate)   BMI 30.75 kg/m  General:   Alert and oriented. Pleasant and cooperative. Well-nourished and well-developed.  Head:  Normocephalic and atraumatic. Eyes:  Without icterus, sclera clear and conjunctiva pink.  Ears:  Normal auditory acuity.  Neck:  Supple, without mass or thyromegaly. Lungs:  Clear to auscultation bilaterally. No wheezes, rales, or rhonchi. No distress.  Heart:  S1, S2 present without murmurs appreciated.  Abdomen:  +BS, soft, non-tender and non-distended. No HSM noted. No guarding or rebound. No masses appreciated.  Rectal:  Deferred  Msk:  Symmetrical without gross deformities. Normal posture. Extremities:  Without edema. Neurologic:  Alert and  oriented x4;  grossly normal neurologically. Skin:  Intact without significant lesions or rashes. Psych:  Normal mood and affect.    Assessment: 45 year old  female with GI history of heartburn who presents today at the request of her OB/GYN for further evaluation of abdominal pain.  Patient reports new onset of intermittent left flank/back pain around Christmas that then became more localized in the left upper quadrant, but now also with intermittent pain in the epigastric area and RUQ as well.  Pain is postprandial, lasting about an hour, and self resolves.  Reports associated intermittent "knot" in the LUQ as well.  Denies nausea, vomiting, BRBPR, melena.  GERD well controlled on omeprazole 20 mg daily.  Intentional 20 pound weight loss since January with weight watchers.  Rare NSAIDs.  Occasional wine.  No prior EGD or colonoscopy.  Family history significant for maternal grandfather with pancreatic cancer in his 20s and paternal grandfather with colon cancer in his mid 73s.  Differentials include pancreatitis, gastritis, duodenitis, PUD, gallbladder/biliary etiology, and  can't rule out pancreatic malignancy. With symptoms primarily being left sided, we need to rule out pancreatitis/malignancy with CT as this is atypical for biliary presentation. If negative, will need to consider EGD. We will also update labs.   Patient also needs colonoscopy sometime in the near future for colon cancer screening.  We will follow-up on this for now pending evaluation of upper GI symptoms.   Plan: 1.  CT A/P with contrast ASAP. 2.  CBC, CMP, lipase. 3.  Continue omeprazole 20 mg daily. 4.  Follow a low-fat diet. 5.  General GERD diet/lifestyle recommendations provided. 6.  Further recommendations to follow CT and labs.  ER precautions discussed.    Aliene Altes, PA-C [facility] 01/01/2021

## 2021-01-01 ENCOUNTER — Ambulatory Visit (INDEPENDENT_AMBULATORY_CARE_PROVIDER_SITE_OTHER): Payer: Commercial Managed Care - PPO | Admitting: Gastroenterology

## 2021-01-01 ENCOUNTER — Other Ambulatory Visit: Payer: Self-pay

## 2021-01-01 ENCOUNTER — Encounter: Payer: Self-pay | Admitting: Gastroenterology

## 2021-01-01 VITALS — BP 132/83 | HR 69 | Temp 97.7°F | Ht 63.0 in | Wt 173.6 lb

## 2021-01-01 DIAGNOSIS — R1010 Upper abdominal pain, unspecified: Secondary | ICD-10-CM

## 2021-01-01 DIAGNOSIS — K219 Gastro-esophageal reflux disease without esophagitis: Secondary | ICD-10-CM | POA: Diagnosis not present

## 2021-01-01 NOTE — Progress Notes (Signed)
Referring Provider:Horvath, Sharyn Lull, MD Primary Care Physician:  [facility] Primary Gastroenterologist:  Dr. Abbey Chatters  Chief Complaint  Patient presents with   Abdominal Pain    Approx 4 months. Pain started left side and has moved to abd and lately has noticed right upper abd    HPI:   Maria Velasquez is a 45 y.o. female presenting today at the request of Bobbye Charleston, MD for LLQ pain.   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 Recently underwent laser ablation of vaginal dysphasia on 10/26/20. Biopsy was taken at that time of most concerning area. Also with history of right oophrectomy due to ovarian torsion in 2020. Left ovary in situ.   Around Christmas, she started noticing left flank/back pain after eating. This moved to LUQ, epigastric, and RUQ, but still most frequently in LUQ. Feels like she gets a knot in LUQ. Can see a bulge at times. Resolves within about 1 hour. No nausea or vomiting. No gassiness. No constipation or diarrhea. Bms every 2 days at baseline. Had [place] Saturday night. This was the last occurrence. Symptoms occur at least once a week. Triggered by fried/fatty foods and wine. Lost 20 lbs with weight watchers since January.   GERD well controlled on omeprazole daily. No dysphagia.   Rare ibuprofen. Uses tylenol for HA.   No brbpr or melena.   No prior colonoscopy or EGD.   Past Medical History:  Diagnosis Date   Headache    otc med prn   Heartburn    History of cervical dysplasia    History of uterine fibroid    Lipoma    Vaginal dysplasia     Past Surgical History:  Procedure Laterality Date   CO2 LASER APPLICATION N/A 10/24/8411   Procedure: CO2 LASER APPLICATION OF VAGINA;  Surgeon: Lafonda Mosses, MD;  Location: [facility];  Service: Gynecology;  Laterality: N/A;   DILATION AND CURETTAGE OF UTERUS  08/05/2009  @[facility]    missed abortion    LAPAROSCOPIC SALPINGO OOPHERECTOMY Right 08-28-2018  @[facility]    ovarian torsion   LAPAROSCOPIC TUBAL LIGATION W/ FILCHIE CLIPS Bilateral 03-20-2010  @[facility]    LUMBAR DISC SURGERY  01-30-2007  @[facility]    L4-- L5   ROBOTIC ASSISTED BILATERAL SALPINGO OOPHERECTOMY Bilateral 11/16/2014   Procedure: ROBOTIC ASSISTED BILATERAL SALPINGECTOMY;  Surgeon: Bobbye Charleston, MD;  Location: [facility];  Service: Gynecology;  Laterality: Bilateral;   ROBOTIC ASSISTED TOTAL HYSTERECTOMY N/A 11/16/2014   w/ BS   VULVA Milagros Loll BIOPSY N/A 10/26/2020   Procedure: VAGINAL BIOPSY;  Surgeon: Lafonda Mosses, MD;  Location: [facility];  Service: Gynecology;  Laterality: N/A;   WISDOM TOOTH EXTRACTION  1997    Current Outpatient Medications  Medication Sig Dispense Refill   ALPRAZolam (XANAX) 0.5 MG tablet Take 0.5 mg by mouth daily as needed. for anxiety     ibuprofen (ADVIL) 800 MG tablet Take 1 tablet (800 mg total) by mouth every 8 (eight) hours as needed for moderate pain. For AFTER surgery 30 tablet 1   loratadine (CLARITIN) 10 MG tablet Take 10 mg by mouth daily as needed.     omeprazole (PRILOSEC) 20 MG capsule Take 20 mg by mouth daily.     No current facility-administered medications for this visit.    Allergies as of 01/01/2021 - Review Complete 01/01/2021  Allergen Reaction Noted   Ciprofloxacin  10/04/2020   Oxycodone Hives, Itching, and  Other (See Comments) 11/02/2014   Prednisone Other (See Comments) 08/28/2018    Family History  Problem Relation Age of Onset   Breast cancer Mother    Pancreatic cancer Maternal Grandfather        early 56s   Colon cancer Paternal Grandfather        mid 49s    Social History   Socioeconomic History   Marital status: Married    Spouse name: Not on file   Number of children: Not on file   Years of education: Not on file   Highest education level: Not on file  Occupational History   Not on file  Tobacco Use   Smoking status: Never   Smokeless  tobacco: Never  Vaping Use   Vaping Use: Never used  Substance and Sexual Activity   Alcohol use: Yes    Comment: occasional wine   Drug use: Never   Sexual activity: Not Currently    Birth control/protection: Surgical    Comment: hyst  Other Topics Concern   Not on file  Social History Narrative   Not on file   Social Determinants of Health   Financial Resource Strain: Not on file  Food Insecurity: Not on file  Transportation Needs: Not on file  Physical Activity: Not on file  Stress: Not on file  Social Connections: Not on file  Intimate Partner Violence: Not on file    Review of Systems: Gen: Denies any fever, chills, cold or flulike symptoms, lightheadedness, dizziness, presyncope, syncope. CV: Denies chest pain or palpitations. Resp: Denies shortness of breath or cough. GI: See HPI GU : Denies urinary burning, urinary frequency, urinary hesitancy MS: Denies joint pain  Derm: Denies rash Psych: Denies depression or anxiety Heme: See HPI  Physical Exam: BP 132/83   Pulse 69   Temp 97.7 F (36.5 C) (Temporal)   Ht 5\' 3"  (1.6 m)   Wt 173 lb 9.6 oz (78.7 kg)   LMP 10/19/2014 (Approximate)   BMI 30.75 kg/m  General:   Alert and oriented. Pleasant and cooperative. Well-nourished and well-developed.  Head:  Normocephalic and atraumatic. Eyes:  Without icterus, sclera clear and conjunctiva pink.  Ears:  Normal auditory acuity.  Neck:  Supple, without mass or thyromegaly. Lungs:  Clear to auscultation bilaterally. No wheezes, rales, or rhonchi. No distress.  Heart:  S1, S2 present without murmurs appreciated.  Abdomen:  +BS, soft, non-tender and non-distended. No HSM noted. No guarding or rebound. No masses appreciated.  Rectal:  Deferred  Msk:  Symmetrical without gross deformities. Normal posture. Extremities:  Without edema. Neurologic:  Alert and  oriented x4;  grossly normal neurologically. Skin:  Intact without significant lesions or rashes. Psych:  Normal  mood and affect.    Assessment: 45 year old female with GI history of heartburn who presents today at the request of her OB/GYN for further evaluation of abdominal pain.  Patient reports new onset of intermittent left flank/back pain around Christmas that then became more localized in the left upper quadrant, but now also with intermittent pain in the epigastric area and RUQ as well.  Pain is postprandial, lasting about an hour, and self resolves.  Reports associated intermittent "knot" in the LUQ as well.  Denies nausea, vomiting, BRBPR, melena.  GERD well controlled on omeprazole 20 mg daily.  Intentional 20 pound weight loss since January with weight watchers.  Rare NSAIDs.  Occasional wine.  No prior EGD or colonoscopy.  Family history significant for maternal grandfather with pancreatic cancer  in his 60s and paternal grandfather with colon cancer in his mid 23s.  Differentials include pancreatitis, gastritis, duodenitis, PUD, gallbladder/biliary etiology, and can't rule out pancreatic malignancy. With symptoms primarily being left sided, we need to rule out pancreatitis/malignancy with CT as this is atypical for biliary presentation. If negative, will need to consider EGD. We will also update labs.   Patient also needs colonoscopy sometime in the near future for colon cancer screening.  We will follow-up on this for now pending evaluation of upper GI symptoms.   Plan: 1.  CT A/P with contrast ASAP. 2.  CBC, CMP, lipase. 3.  Continue omeprazole 20 mg daily. 4.  Follow a low-fat diet. 5.  General GERD diet/lifestyle recommendations provided. 6.  Further recommendations to follow CT and labs.  ER precautions discussed.    Aliene Altes, PA-C [facility] 01/01/2021

## 2021-01-01 NOTE — Patient Instructions (Signed)
We will arrange for you to have a CT scan at [place] may go to [facility] to have your blood work completed at your convenience in the next week.  Continue omeprazole 20 mg daily for acid reflux.  Recommend following a low-fat diet.  Avoid fried, fatty, greasy foods.  Follow a GERD diet:  Avoid fried, fatty, greasy, spicy, citrus foods. Avoid caffeine and carbonated beverages. Avoid chocolate. Try eating 4-6 small meals a day rather than 3 large meals. Do not eat within 3 hours of laying down. Prop head of bed up on wood or bricks to create a 6 inch incline.  We will have further recommendations for you once I have your CT and lab results.  As we discussed, if any severe, persistent abdominal pain for hours, proceed to the emergency room.  It was great to meet you today!  Aliene Altes, PA-C [facility]

## 2021-01-02 ENCOUNTER — Telehealth: Payer: Self-pay

## 2021-01-02 LAB — CBC WITH DIFFERENTIAL/PLATELET
Absolute Monocytes: 525 cells/uL (ref 200–950)
Basophils Absolute: 34 cells/uL (ref 0–200)
Basophils Relative: 0.4 %
Eosinophils Absolute: 120 cells/uL (ref 15–500)
Eosinophils Relative: 1.4 %
HCT: 45.5 % — ABNORMAL HIGH (ref 35.0–45.0)
Hemoglobin: 15.3 g/dL (ref 11.7–15.5)
Lymphs Abs: 3208 cells/uL (ref 850–3900)
MCH: 30.4 pg (ref 27.0–33.0)
MCHC: 33.6 g/dL (ref 32.0–36.0)
MCV: 90.3 fL (ref 80.0–100.0)
MPV: 9.6 fL (ref 7.5–12.5)
Monocytes Relative: 6.1 %
Neutro Abs: 4713 cells/uL (ref 1500–7800)
Neutrophils Relative %: 54.8 %
Platelets: 307 10*3/uL (ref 140–400)
RBC: 5.04 10*6/uL (ref 3.80–5.10)
RDW: 12.6 % (ref 11.0–15.0)
Total Lymphocyte: 37.3 %
WBC: 8.6 10*3/uL (ref 3.8–10.8)

## 2021-01-02 LAB — COMPLETE METABOLIC PANEL WITH GFR
AG Ratio: 1.7 (calc) (ref 1.0–2.5)
ALT: 12 U/L (ref 6–29)
AST: 14 U/L (ref 10–30)
Albumin: 4.5 g/dL (ref 3.6–5.1)
Alkaline phosphatase (APISO): 88 U/L (ref 31–125)
BUN: 14 mg/dL (ref 7–25)
CO2: 26 mmol/L (ref 20–32)
Calcium: 9.7 mg/dL (ref 8.6–10.2)
Chloride: 103 mmol/L (ref 98–110)
Creat: 0.64 mg/dL (ref 0.50–0.99)
Globulin: 2.7 g/dL (calc) (ref 1.9–3.7)
Glucose, Bld: 81 mg/dL (ref 65–139)
Potassium: 4.2 mmol/L (ref 3.5–5.3)
Sodium: 138 mmol/L (ref 135–146)
Total Bilirubin: 0.6 mg/dL (ref 0.2–1.2)
Total Protein: 7.2 g/dL (ref 6.1–8.1)
eGFR: 112 mL/min/{1.73_m2} (ref >=60)

## 2021-01-02 LAB — LIPASE: Lipase: 46 U/L (ref 7–60)

## 2021-01-02 NOTE — Telephone Encounter (Signed)
Tried to call pt, LMOVM re: CT for tomorrow. Advised her to call central scheduling if unable to have CT tomorrow to reschedule or call our office.

## 2021-01-02 NOTE — Telephone Encounter (Signed)
CT scheduled for 01/03/21 at 3:30pm, arrive at 3:15pm. NPO 4 hours prior to test. Pick up contrast today.  Tried to call pt, left detailed message on voicemail to inform her of CT appt. Asked to call office back to let us know she received message. MyChart message also sent.

## 2021-01-02 NOTE — Telephone Encounter (Signed)
Pt called office, she is aware of CT appt tomorrow. Will pick up contrast today.

## 2021-01-02 NOTE — Telephone Encounter (Signed)
[place], spoke to [place], [place] needed for CT abd/pelvis w/contrast. Call ref# [id number]. Need to call [phone].  Called # Sky advised to call, spoke to [place], [place] initiated for CT. Was on phone extended amount of time d/t they were having system issues. Rose took [employer] and will call office back with determination.

## 2021-01-02 NOTE — Telephone Encounter (Signed)
Rose at [place]. CT abd/pelvis w/contrast approved. Case# [id number], valid 01/02/21-01/31/21.

## 2021-01-03 ENCOUNTER — Other Ambulatory Visit: Payer: Self-pay

## 2021-01-03 ENCOUNTER — Ambulatory Visit (HOSPITAL_COMMUNITY)
Admission: RE | Admit: 2021-01-03 | Discharge: 2021-01-03 | Disposition: A | Discharge status: Home / Self Care | Payer: Commercial Managed Care - PPO | Source: Ambulatory Visit | Attending: Gastroenterology | Admitting: Gastroenterology

## 2021-01-03 DIAGNOSIS — R1010 Upper abdominal pain, unspecified: Secondary | ICD-10-CM | POA: Diagnosis not present

## 2021-01-03 MED ORDER — IOHEXOL 300 MG/ML  SOLN
100.0000 mL | Freq: Once | INTRAMUSCULAR | Status: AC | PRN
  Administered 2021-01-03: 100 mL via INTRAVENOUS

## 2021-01-10 ENCOUNTER — Encounter: Payer: Self-pay | Admitting: Internal Medicine

## 2021-01-10 ENCOUNTER — Telehealth: Payer: Self-pay | Admitting: Internal Medicine

## 2021-01-10 NOTE — Telephone Encounter (Signed)
Noted on CT result note Aliene Altes PA recommended EGD.  Routing to [place] for further.

## 2021-01-10 NOTE — Telephone Encounter (Signed)
Please arrange EGD with propofol with Dr. Abbey Chatters for further evaluation of postprandial upper abdominal pain.  ASA II

## 2021-01-10 NOTE — Telephone Encounter (Signed)
Called pt, EGD scheduled for 01/26/21 at 9:45am. She will have new insurance starting 01/22/21. New insurance will be [employer] [place]. Informed her [employer] [place] doesn't required [place] for procedure. Orders entered. Procedure instructions mailed to pt.

## 2021-01-10 NOTE — Telephone Encounter (Signed)
[phone] PATIENT CALLED AND SAID THAT SHE WILL GO AHEAD WITH THE PROCEDURE AND TO CALL HER

## 2021-01-11 ENCOUNTER — Telehealth: Payer: Self-pay

## 2021-01-11 DIAGNOSIS — R1010 Upper abdominal pain, unspecified: Secondary | ICD-10-CM

## 2021-01-11 NOTE — Telephone Encounter (Signed)
Pt called office and LMOVM, she wants to know if she can have HIDA scan done. She is wondering if some of her symptoms may be related to her gallbladder.   Called pt back, most of her symptoms seem to occur later in day. Last night she ate scrambled eggs, bacon, and apples. After eating she had right side abdominal pain. Pain went up into her back, neck, and arm. Felt like gas pains, lasted approx 30 minutes. She drank a Dr. Malachi Bonds and walked around so she could burp. Ate boiled eggs this morning and had some abdominal pain afterwards. She would like to have HIDA scan done and hold off on EGD at this time. She will be on vacation next week. She would like HIDA done 01/25/21 or 01/26/21.   Spoke to [employer] PA and she's agreeable to HIDA.

## 2021-01-11 NOTE — Telephone Encounter (Signed)
Communication noted.  Agree with HIDA scan.

## 2021-01-11 NOTE — Telephone Encounter (Signed)
HIDA scheduled for 01/26/21 at 10:00am, arrive at 9:45am. NPO after midnight and no pain med morning of test.  Called pt back and informed her of HIDA appt.  FYI to [employer] PA.  Endo scheduler informed to cancel EGD.

## 2021-01-26 ENCOUNTER — Ambulatory Visit (HOSPITAL_COMMUNITY): Admit: 2021-01-26 | Payer: Commercial Managed Care - PPO

## 2021-01-26 ENCOUNTER — Other Ambulatory Visit: Payer: Self-pay

## 2021-01-26 ENCOUNTER — Ambulatory Visit (HOSPITAL_COMMUNITY)
Admission: RE | Admit: 2021-01-26 | Discharge: 2021-01-26 | Disposition: A | Discharge status: Home / Self Care | Payer: BC Managed Care – PPO | Source: Ambulatory Visit | Attending: Gastroenterology | Admitting: Gastroenterology

## 2021-01-26 ENCOUNTER — Encounter (HOSPITAL_COMMUNITY): Payer: Self-pay

## 2021-01-26 DIAGNOSIS — R1010 Upper abdominal pain, unspecified: Secondary | ICD-10-CM | POA: Diagnosis present

## 2021-01-26 HISTORY — DX: Malignant (primary) neoplasm, unspecified: C80.1

## 2021-01-26 SURGERY — ESOPHAGOGASTRODUODENOSCOPY (EGD) WITH PROPOFOL
Anesthesia: Monitor Anesthesia Care

## 2021-01-26 MED ORDER — TECHNETIUM TC 99M MEBROFENIN IV KIT
5.0000 | PACK | Freq: Once | INTRAVENOUS | Status: AC | PRN
  Administered 2021-01-26: 5.1 via INTRAVENOUS

## 2021-01-31 ENCOUNTER — Encounter: Payer: Self-pay | Admitting: *Deleted

## 2021-01-31 MED ORDER — PEG 3350-KCL-NA BICARB-NACL 420 G PO SOLR: ORAL | 0 refills | Status: DC

## 2021-02-09 ENCOUNTER — Other Ambulatory Visit: Payer: Self-pay

## 2021-02-09 ENCOUNTER — Ambulatory Visit (HOSPITAL_COMMUNITY): Payer: BC Managed Care – PPO | Admitting: Anesthesiology

## 2021-02-09 ENCOUNTER — Encounter (HOSPITAL_COMMUNITY): Payer: Self-pay

## 2021-02-09 ENCOUNTER — Encounter (HOSPITAL_COMMUNITY)
Admission: RE | Disposition: A | Discharge status: Home / Self Care | Payer: Self-pay | Source: Ambulatory Visit | Attending: Internal Medicine

## 2021-02-09 ENCOUNTER — Ambulatory Visit (HOSPITAL_COMMUNITY)
Admission: RE | Admit: 2021-02-09 | Discharge: 2021-02-09 | Disposition: A | Discharge status: Home / Self Care | Payer: BC Managed Care – PPO | Source: Ambulatory Visit | Attending: Internal Medicine | Admitting: Internal Medicine

## 2021-02-09 DIAGNOSIS — Z1211 Encounter for screening for malignant neoplasm of colon: Secondary | ICD-10-CM | POA: Insufficient documentation

## 2021-02-09 DIAGNOSIS — K648 Other hemorrhoids: Secondary | ICD-10-CM | POA: Insufficient documentation

## 2021-02-09 DIAGNOSIS — Z885 Allergy status to narcotic agent status: Secondary | ICD-10-CM | POA: Diagnosis not present

## 2021-02-09 DIAGNOSIS — Z881 Allergy status to other antibiotic agents status: Secondary | ICD-10-CM | POA: Diagnosis not present

## 2021-02-09 DIAGNOSIS — Z800 Family history of malignant neoplasm of digestive organs: Secondary | ICD-10-CM | POA: Insufficient documentation

## 2021-02-09 DIAGNOSIS — K2970 Gastritis, unspecified, without bleeding: Secondary | ICD-10-CM

## 2021-02-09 DIAGNOSIS — Z888 Allergy status to other drugs, medicaments and biological substances status: Secondary | ICD-10-CM | POA: Diagnosis not present

## 2021-02-09 DIAGNOSIS — Z803 Family history of malignant neoplasm of breast: Secondary | ICD-10-CM | POA: Diagnosis not present

## 2021-02-09 DIAGNOSIS — Z79899 Other long term (current) drug therapy: Secondary | ICD-10-CM | POA: Diagnosis not present

## 2021-02-09 DIAGNOSIS — K2950 Unspecified chronic gastritis without bleeding: Secondary | ICD-10-CM | POA: Diagnosis not present

## 2021-02-09 HISTORY — PX: BIOPSY: SHX5522

## 2021-02-09 HISTORY — PX: COLONOSCOPY WITH PROPOFOL: SHX5780

## 2021-02-09 HISTORY — PX: ESOPHAGOGASTRODUODENOSCOPY (EGD) WITH PROPOFOL: SHX5813

## 2021-02-09 SURGERY — COLONOSCOPY WITH PROPOFOL
Anesthesia: General

## 2021-02-09 MED ORDER — STERILE WATER FOR IRRIGATION IR SOLN
Status: DC | PRN
  Administered 2021-02-09: 200 mL

## 2021-02-09 MED ORDER — LACTATED RINGERS IV SOLN: INTRAVENOUS | Status: DC

## 2021-02-09 MED ORDER — PROPOFOL 10 MG/ML IV BOLUS
INTRAVENOUS | Status: DC | PRN
  Administered 2021-02-09: 50 mg via INTRAVENOUS
  Administered 2021-02-09: 100 mg via INTRAVENOUS
  Administered 2021-02-09: 50 mg via INTRAVENOUS
  Administered 2021-02-09: 150 ug/kg/min via INTRAVENOUS

## 2021-02-09 MED ORDER — LIDOCAINE HCL (CARDIAC) PF 100 MG/5ML IV SOSY
PREFILLED_SYRINGE | INTRAVENOUS | Status: DC | PRN
  Administered 2021-02-09: 100 mg via INTRAVENOUS

## 2021-02-09 NOTE — Anesthesia Preprocedure Evaluation (Addendum)
Anesthesia Evaluation  Patient identified by MRN, date of birth, ID band Patient awake    Reviewed: Allergy & Precautions, NPO status , Patient's Chart, lab work & pertinent test results  Airway Mallampati: II  TM Distance: >3 FB Neck ROM: Full    Dental  (+) Dental Advisory Given, Teeth Intact   Pulmonary neg pulmonary ROS,    Pulmonary exam normal breath sounds clear to auscultation       Cardiovascular Exercise Tolerance: Good Normal cardiovascular exam Rhythm:Regular Rate:Normal     Neuro/Psych  Headaches, negative psych ROS   GI/Hepatic Neg liver ROS, GERD  Medicated,  Endo/Other  negative endocrine ROS  Renal/GU negative Renal ROS  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)     Musculoskeletal   Abdominal   Peds  Hematology negative hematology ROS (+)   Anesthesia Other Findings   Reproductive/Obstetrics                            Anesthesia Physical Anesthesia Plan  ASA: 2  Anesthesia Plan: General   Post-op Pain Management:    Induction: Intravenous  PONV Risk Score and Plan: Propofol infusion  Airway Management Planned: Nasal Cannula and Natural Airway  Additional Equipment:   Intra-op Plan:   Post-operative Plan:   Informed Consent: I have reviewed the patients History and Physical, chart, labs and discussed the procedure including the risks, benefits and alternatives for the proposed anesthesia with the patient or authorized representative who has indicated his/her understanding and acceptance.     Dental advisory given  Plan Discussed with: CRNA and Surgeon  Anesthesia Plan Comments:         Anesthesia Quick Evaluation

## 2021-02-09 NOTE — Transfer of Care (Signed)
Immediate Anesthesia Transfer of Care Note  Patient: Maria Velasquez  Procedure(s) Performed: COLONOSCOPY WITH PROPOFOL ESOPHAGOGASTRODUODENOSCOPY (EGD) WITH PROPOFOL BIOPSY  Patient Location: Endoscopy Unit  Anesthesia Type:General  Level of Consciousness: awake, alert , oriented and patient cooperative  Airway & Oxygen Therapy: Patient Spontanous Breathing  Post-op Assessment: Report given to RN, Post -op Vital signs reviewed and stable and Patient moving all extremities X 4  Post vital signs: Reviewed and stable  Last Vitals:  Vitals Value Taken Time  BP    Temp    Pulse    Resp    SpO2      Last Pain:  Vitals:   02/09/21 0911  TempSrc:   PainSc: 0-No pain      Patients Stated Pain Goal: 3 ([id number])  Complications: No notable events documented.

## 2021-02-09 NOTE — Discharge Instructions (Addendum)
EGD Discharge instructions Please read the instructions outlined below and refer to this sheet in the next few weeks. These discharge instructions provide you with general information on caring for yourself after you leave the hospital. Your doctor may also give you specific instructions. While your treatment has been planned according to the most current medical practices available, unavoidable complications occasionally occur. If you have any problems or questions after discharge, please call your doctor. ACTIVITY You may resume your regular activity but move at a slower pace for the next 24 hours.  Take frequent rest periods for the next 24 hours.  Walking will help expel (get rid of) the air and reduce the bloated feeling in your abdomen.  No driving for 24 hours (because of the anesthesia (medicine) used during the test).  You may shower.  Do not sign any important legal documents or operate any machinery for 24 hours (because of the anesthesia used during the test).  NUTRITION Drink plenty of fluids.  You may resume your normal diet.  Begin with a light meal and progress to your normal diet.  Avoid alcoholic beverages for 24 hours or as instructed by your caregiver.  MEDICATIONS You may resume your normal medications unless your caregiver tells you otherwise.  WHAT YOU CAN EXPECT TODAY You may experience abdominal discomfort such as a feeling of fullness or "gas" pains.  FOLLOW-UP Your doctor will discuss the results of your test with you.  SEEK IMMEDIATE MEDICAL ATTENTION IF ANY OF THE FOLLOWING OCCUR: Excessive nausea (feeling sick to your stomach) and/or vomiting.  Severe abdominal pain and distention (swelling).  Trouble swallowing.  Temperature over 101 F (37.8 C).  Rectal bleeding or vomiting of blood.     Colonoscopy Discharge Instructions  Read the instructions outlined below and refer to this sheet in the next few weeks. These discharge instructions provide you with  general information on caring for yourself after you leave the hospital. Your doctor may also give you specific instructions. While your treatment has been planned according to the most current medical practices available, unavoidable complications occasionally occur.   ACTIVITY You may resume your regular activity, but move at a slower pace for the next 24 hours.  Take frequent rest periods for the next 24 hours.  Walking will help get rid of the air and reduce the bloated feeling in your belly (abdomen).  No driving for 24 hours (because of the medicine (anesthesia) used during the test).   Do not sign any important legal documents or operate any machinery for 24 hours (because of the anesthesia used during the test).  NUTRITION Drink plenty of fluids.  You may resume your normal diet as instructed by your doctor.  Begin with a light meal and progress to your normal diet. Heavy or fried foods are harder to digest and may make you feel sick to your stomach (nauseated).  Avoid alcoholic beverages for 24 hours or as instructed.  MEDICATIONS You may resume your normal medications unless your doctor tells you otherwise.  WHAT YOU CAN EXPECT TODAY Some feelings of bloating in the abdomen.  Passage of more gas than usual.  Spotting of blood in your stool or on the toilet paper.  IF YOU HAD POLYPS REMOVED DURING THE COLONOSCOPY: No aspirin products for 7 days or as instructed.  No alcohol for 7 days or as instructed.  Eat a soft diet for the next 24 hours.  FINDING OUT THE RESULTS OF YOUR TEST Not all test results are  available during your visit. If your test results are not back during the visit, make an appointment with your caregiver to find out the results. Do not assume everything is normal if you have not heard from your caregiver or the medical facility. It is important for you to follow up on all of your test results.  SEEK IMMEDIATE MEDICAL ATTENTION IF: You have more than a spotting of  blood in your stool.  Your belly is swollen (abdominal distention).  You are nauseated or vomiting.  You have a temperature over 101.  You have abdominal pain or discomfort that is severe or gets worse throughout the day.   Your EGD revealed a mild amount inflammation in your stomach.  I took biopsies of this to rule infection with a bacteria called H. pylori.  Continue on omeprazole daily.  Your colon looked great.  I did not see any inflammation indicative of underlying inflammatory bowel disease such as Crohn's disease or ulcerative colitis.  I did not find any polyps or evidence of colon cancer.  Recommend repeat colonoscopy in 10 years for screening purposes.  Await pathology results, my office will contact you.  Follow-up with GI in 3 months--CALL OFFICE NEXT WEEK TO [place], [place]  I hope you have a great rest of your week!  Elon Alas. Abbey Chatters, D.O. Gastroenterology and Hepatology [facility]

## 2021-02-09 NOTE — H&P (Signed)
Primary Care Physician:  [facility] Primary Gastroenterologist:  Dr. Abbey Chatters  Pre-Procedure History & Physical: HPI:  Maria Velasquez is a 45 y.o. female is here for an EGD for epigastric and LUQ pain and  colonoscopy to be performed for colon cancer screening purposes.  Past Medical History:  Diagnosis Date   Cancer ([place])    Vaginal   Headache    otc med prn   Heartburn    History of cervical dysplasia    History of uterine fibroid    Lipoma    Vaginal dysplasia     Past Surgical History:  Procedure Laterality Date   CO2 LASER APPLICATION N/A 0000000   Procedure: CO2 LASER APPLICATION OF VAGINA;  Surgeon: Lafonda Mosses, MD;  Location: [facility];  Service: Gynecology;  Laterality: N/A;   DILATION AND CURETTAGE OF UTERUS  08/05/2009  '@[facility]'$    missed abortion   LAPAROSCOPIC SALPINGO OOPHERECTOMY Right 08-28-2018  '@[facility]'$    ovarian torsion   LAPAROSCOPIC TUBAL LIGATION W/ FILCHIE CLIPS Bilateral 03-20-2010  '@[facility]'$    LUMBAR DISC SURGERY  01-30-2007  '@[facility]'$    L4-- L5   ROBOTIC ASSISTED BILATERAL SALPINGO OOPHERECTOMY Bilateral 11/16/2014   Procedure: ROBOTIC ASSISTED BILATERAL SALPINGECTOMY;  Surgeon: Bobbye Charleston, MD;  Location: [facility];  Service: Gynecology;  Laterality: Bilateral;   ROBOTIC ASSISTED TOTAL HYSTERECTOMY N/A 11/16/2014   w/ BS   VULVA Maria Velasquez BIOPSY N/A 10/26/2020   Procedure: VAGINAL BIOPSY;  Surgeon: Lafonda Mosses, MD;  Location: [facility];  Service: Gynecology;  Laterality: N/A;   [place]    Prior to Admission medications   Medication Sig Start Date End Date Taking? Authorizing Provider  Multiple Vitamins-Minerals (ADULT ONE DAILY GUMMIES PO) Take 2 tablets by mouth in the morning.   Yes [provider]  nitrofurantoin, macrocrystal-monohydrate, (MACROBID) 100 MG capsule Take 100 mg by mouth 2 (two) times daily.   Yes [provider]  omeprazole (PRILOSEC) 20 MG capsule  Take 20 mg by mouth in the morning.   Yes [provider]  polyethylene glycol-electrolytes (NULYTELY) 420 g solution As directed 01/31/21  Yes Brandis Wixted, Elon Alas, DO  ALPRAZolam Duanne Moron) 0.5 MG tablet Take 0.5 mg by mouth daily as needed for anxiety. 05/28/18   [provider]  ibuprofen (ADVIL) 800 MG tablet Take 1 tablet (800 mg total) by mouth every 8 (eight) hours as needed for moderate pain. For AFTER surgery Patient taking differently: Take 800 mg by mouth daily as needed for moderate pain. 10/05/20   Joylene John D, NP    Allergies as of 01/31/2021 - Review Complete 01/26/2021  Allergen Reaction Noted   Ciprofloxacin  10/04/2020   Oxycodone Hives, Itching, and Other (See Comments) 11/02/2014   Prednisone Other (See Comments) 08/28/2018    Family History  Problem Relation Age of Onset   Breast cancer Mother    Pancreatic cancer Maternal Grandfather        early 79s   Colon cancer Paternal Grandfather        mid 52s    Social History   Socioeconomic History   Marital status: Married    Spouse name: Not on file   Number of children: Not on file   Years of education: Not on file   Highest education level: Not on file  Occupational History   Not on file  Tobacco Use   Smoking status: Never   Smokeless tobacco: Never  Vaping Use   Vaping  Use: Never used  Substance and Sexual Activity   Alcohol use: Yes    Comment: occasional wine   Drug use: Never   Sexual activity: Not Currently    Birth control/protection: Surgical    Comment: hyst  Other Topics Concern   Not on file  Social History Narrative   Not on file   Social Determinants of Health   Financial Resource Strain: Not on file  Food Insecurity: Not on file  Transportation Needs: Not on file  Physical Activity: Not on file  Stress: Not on file  Social Connections: Not on file  Intimate Partner Violence: Not on file    Review of Systems: See HPI, otherwise negative ROS  Physical  Exam: Vital signs in last 24 hours: Temp:  [98.3 F (36.8 C)] 98.3 F (36.8 C) (08/19 0821) Pulse Rate:  [71] 71 (08/19 0821) Resp:  [15] 15 (08/19 0821) BP: (114)/(67) 114/67 (08/19 0821) SpO2:  [99 %] 99 % (08/19 0821) Weight:  [76.7 kg] 76.7 kg (08/19 0821)   General:   Alert,  Well-developed, well-nourished, pleasant and cooperative in NAD Head:  Normocephalic and atraumatic. Eyes:  Sclera clear, no icterus.   Conjunctiva pink. Ears:  Normal auditory acuity. Nose:  No deformity, discharge,  or lesions. Mouth:  No deformity or lesions, dentition normal. Neck:  Supple; no masses or thyromegaly. Lungs:  Clear throughout to auscultation.   No wheezes, crackles, or rhonchi. No acute distress. Heart:  Regular rate and rhythm; no murmurs, clicks, rubs,  or gallops. Abdomen:  Soft, nontender and nondistended. No masses, hepatosplenomegaly or hernias noted. Normal bowel sounds, without guarding, and without rebound.   Msk:  Symmetrical without gross deformities. Normal posture. Extremities:  Without clubbing or edema. Neurologic:  Alert and  oriented x4;  grossly normal neurologically. Skin:  Intact without significant lesions or rashes. Cervical Nodes:  No significant cervical adenopathy. Psych:  Alert and cooperative. Normal mood and affect.  Impression/Plan: Maria Velasquez is here for an EGD for epigastric and LUQ pain and  colonoscopy to be performed for colon cancer screening purposes.  The risks of the procedure including infection, bleed, or perforation as well as benefits, limitations, alternatives and imponderables have been reviewed with the patient. Questions have been answered. All parties agreeable.

## 2021-02-09 NOTE — Op Note (Signed)
[facility] Patient Name: Maria Velasquez Procedure Date: 02/09/2021 9:04 AM MRN: [id number] Date of Birth: 08/13/1975 Attending MD: Hennie Duos. Marletta Lor DO CSN: [id number] Age: 45 Admit Type: Outpatient Procedure:                Upper GI endoscopy Indications:              Epigastric abdominal pain, Abdominal pain in the                            left upper quadrant Providers:                Hennie Duos. Marletta Lor, DO, Crystal Page, Burke Keels, Technician Referring MD:              Medicines:                See the Anesthesia note for documentation of the                            administered medications Complications:            No immediate complications. Estimated Blood Loss:     Estimated blood loss was minimal. Procedure:                Pre-Anesthesia Assessment:                           - The anesthesia plan was to use monitored                            anesthesia care (MAC).                           After obtaining informed consent, the endoscope was                            passed under direct vision. Throughout the                            procedure, the patient's blood pressure, pulse, and                            oxygen saturations were monitored continuously. The                            [id number]-[id number] ([id number]) scope was introduced through the                            mouth, and advanced to the second part of duodenum.                            The upper GI endoscopy was accomplished without                            difficulty. The patient tolerated the  procedure                            well. Scope In: 9:13:47 AM Scope Out: 9:16:10 AM Total Procedure Duration: 0 hours 2 minutes 23 seconds  Findings:      The Z-line was regular and was found 39 cm from the incisors.      There is no endoscopic evidence of Barrett's esophagus, bleeding, areas       of erosion, esophagitis, hiatal hernia, ulcerations or varices in the        entire esophagus.      Localized mild inflammation characterized by erythema was found in the       gastric antrum. Biopsies were taken with a cold forceps for Helicobacter       pylori testing.      The duodenal bulb, first portion of the duodenum and second portion of       the duodenum were normal. Impression:               - Z-line regular, 39 cm from the incisors.                           - Gastritis. Biopsied.                           - Normal duodenal bulb, first portion of the                            duodenum and second portion of the duodenum. Moderate Sedation:      Per Anesthesia Care Recommendation:           - Patient has a contact number available for                            emergencies. The signs and symptoms of potential                            delayed complications were discussed with the                            patient. Return to normal activities tomorrow.                            Written discharge instructions were provided to the                            patient.                           - Resume previous diet.                           - Continue present medications.                           - Await pathology results.                           -  Return to GI clinic in 3 months.                           - Use Prilosec (omeprazole) 20 mg PO daily.                           - Can consider a trial on TCA for functional                            dyspepsia Procedure Code(s):        --- Professional ---                           [phone], Esophagogastroduodenoscopy, flexible,                            transoral; with biopsy, single or multiple Diagnosis Code(s):        --- Professional ---                           K29.70, Gastritis, unspecified, without bleeding                           R10.13, Epigastric pain                           R10.12, Left upper quadrant pain CPT copyright 2019 American Medical Association. All rights reserved. The codes  documented in this report are preliminary and upon coder review may  be revised to meet current compliance requirements. Hennie Duos. Marletta Lor, DO Hennie Duos. Marletta Lor, DO 02/09/2021 9:19:32 AM This report has been signed electronically. Number of Addenda: 0

## 2021-02-09 NOTE — Anesthesia Postprocedure Evaluation (Signed)
Anesthesia Post Note  Patient: Maria Velasquez  Procedure(s) Performed: COLONOSCOPY WITH PROPOFOL ESOPHAGOGASTRODUODENOSCOPY (EGD) WITH PROPOFOL BIOPSY  Patient location during evaluation: Endoscopy Anesthesia Type: General Level of consciousness: awake and alert and oriented Pain management: pain level controlled Vital Signs Assessment: post-procedure vital signs reviewed and stable Respiratory status: spontaneous breathing and respiratory function stable Cardiovascular status: blood pressure returned to baseline and stable Postop Assessment: no apparent nausea or vomiting Anesthetic complications: no   No notable events documented.   Last Vitals:  Vitals:   02/09/21 0821 02/09/21 0936  BP: 114/67 108/69  Pulse: 71   Resp: 15 16  Temp: 36.8 C 36.6 C  SpO2: 99% 98%    Last Pain:  Vitals:   02/09/21 0936  TempSrc: Oral  PainSc: 0-No pain                 Magon Croson C Abelardo Seidner

## 2021-02-09 NOTE — Op Note (Signed)
[facility] Patient Name: Maria Velasquez Procedure Date: 02/09/2021 9:18 AM MRN: [id number] Date of Birth: 01-23-1976 Attending MD: Hennie Duos. Marletta Lor DO CSN: [id number] Age: 45 Admit Type: Outpatient Procedure:                Colonoscopy Indications:              Screening for colorectal malignant neoplasm Providers:                Hennie Duos. Marletta Lor, DO, Crystal Page, Burke Keels, Technician Referring MD:              Medicines:                See the Anesthesia note for documentation of the                            administered medications Complications:            No immediate complications. Estimated Blood Loss:     Estimated blood loss: none. Procedure:                Pre-Anesthesia Assessment:                           - The anesthesia plan was to use monitored                            anesthesia care (MAC).                           After obtaining informed consent, the colonoscope                            was passed under direct vision. Throughout the                            procedure, the patient's blood pressure, pulse, and                            oxygen saturations were monitored continuously. The                            PCF-HQ190L ([id number]) was introduced through the                            anus and advanced to the the cecum, identified by                            appendiceal orifice and ileocecal valve. The                            colonoscopy was performed without difficulty. The                            patient tolerated the procedure well. The quality  of the bowel preparation was evaluated using the                            BBPS [facility] Bowel Preparation Scale) with scores                            of: Right Colon = 3, Transverse Colon = 3 and Left                            Colon = 3 (entire mucosa seen well with no residual                            staining, small fragments of  stool or opaque                            liquid). The total BBPS score equals 9. Scope In: 9:20:28 AM Scope Out: 9:33:56 AM Scope Withdrawal Time: 0 hours 11 minutes 4 seconds  Total Procedure Duration: 0 hours 13 minutes 28 seconds  Findings:      The perianal and digital rectal examinations were normal.      Non-bleeding internal hemorrhoids were found during endoscopy.      The entire examined colon appeared normal. Impression:               - Non-bleeding internal hemorrhoids.                           - The entire examined colon is normal.                           - No specimens collected. Moderate Sedation:      Per Anesthesia Care Recommendation:           - Patient has a contact number available for                            emergencies. The signs and symptoms of potential                            delayed complications were discussed with the                            patient. Return to normal activities tomorrow.                            Written discharge instructions were provided to the                            patient.                           - Resume previous diet.                           - Continue present medications.                           -  Repeat colonoscopy in 10 years for screening                            purposes.                           - Return to GI clinic in 3 months. Procedure Code(s):        --- Professional ---                           Z6109, Colorectal cancer screening; colonoscopy on                            individual not meeting criteria for high risk Diagnosis Code(s):        --- Professional ---                           Z12.11, Encounter for screening for malignant                            neoplasm of colon                           K64.8, Other hemorrhoids CPT copyright 2019 American Medical Association. All rights reserved. The codes documented in this report are preliminary and upon coder review may  be revised to  meet current compliance requirements. Hennie Duos. Marletta Lor, DO Hennie Duos. Marletta Lor, DO 02/09/2021 9:36:07 AM This report has been signed electronically. Number of Addenda: 0

## 2021-02-12 LAB — SURGICAL PATHOLOGY

## 2021-02-19 ENCOUNTER — Telehealth: Payer: Self-pay

## 2021-02-19 ENCOUNTER — Telehealth: Payer: Self-pay | Admitting: Internal Medicine

## 2021-02-19 NOTE — Telephone Encounter (Signed)
You did a EGD on this pt and she stated that she does OTC Omeprazole 20 mg once a day. She would like a Rx for a higher dose sent to her pharmacy because she has been having trouble with bloating. She states she has been eating a lot of tomato sandwiches. The report showed inflammation and that was all. She thinks her dose needs to be stronger. Please advise

## 2021-02-19 NOTE — Progress Notes (Signed)
error 

## 2021-02-19 NOTE — Telephone Encounter (Signed)
Patient got her procedure results from mychart and she would like to speak to a nurse about her plan of care       Note

## 2021-02-19 NOTE — Telephone Encounter (Signed)
Patient got her procedure results from [facility] and she would like to speak to a nurse about her plan of care

## 2021-02-19 NOTE — Telephone Encounter (Signed)
Sent to Dr Abbey Chatters in a addendum note

## 2021-02-20 ENCOUNTER — Telehealth: Payer: Self-pay | Admitting: Internal Medicine

## 2021-02-20 ENCOUNTER — Encounter (HOSPITAL_COMMUNITY): Payer: Self-pay | Admitting: Internal Medicine

## 2021-02-20 MED ORDER — OMEPRAZOLE 40 MG PO CPDR: 40.0000 mg | DELAYED_RELEASE_CAPSULE | Freq: Every day | ORAL | 11 refills | Status: DC

## 2021-02-20 NOTE — Telephone Encounter (Signed)
Prescription for omeprazole 40 mg daily sent to [facility].  Thank you

## 2021-02-20 NOTE — Telephone Encounter (Signed)
Phoned and advised the pt of her Rx being sent to her pharmacy 

## 2021-04-27 ENCOUNTER — Other Ambulatory Visit: Payer: Self-pay | Admitting: Obstetrics and Gynecology

## 2021-04-27 DIAGNOSIS — Z1231 Encounter for screening mammogram for malignant neoplasm of breast: Secondary | ICD-10-CM

## 2021-05-01 ENCOUNTER — Encounter: Payer: Self-pay | Admitting: Internal Medicine

## 2021-06-06 ENCOUNTER — Ambulatory Visit
Admission: RE | Admit: 2021-06-06 | Discharge: 2021-06-06 | Disposition: A | Discharge status: Home / Self Care | Payer: BC Managed Care – PPO | Source: Ambulatory Visit | Attending: Obstetrics and Gynecology | Admitting: Obstetrics and Gynecology

## 2021-06-06 DIAGNOSIS — Z1231 Encounter for screening mammogram for malignant neoplasm of breast: Secondary | ICD-10-CM

## 2022-04-12 ENCOUNTER — Other Ambulatory Visit: Payer: Self-pay | Admitting: Internal Medicine

## 2022-04-12 ENCOUNTER — Other Ambulatory Visit: Payer: Self-pay | Admitting: Obstetrics and Gynecology

## 2022-04-12 DIAGNOSIS — Z1231 Encounter for screening mammogram for malignant neoplasm of breast: Secondary | ICD-10-CM

## 2022-05-04 IMAGING — MG MM DIGITAL SCREENING BILAT W/ TOMO AND CAD
8 series · 8 of 24 positions shown · non-contrast
Comparison: Previous exam(s).

CLINICAL DATA: Screening.

EXAM:
DIGITAL SCREENING BILATERAL MAMMOGRAM WITH TOMOSYNTHESIS AND CAD
TECHNIQUE: Bilateral screening digital craniocaudal and mediolateral oblique
mammograms were obtained. Bilateral screening digital breast
tomosynthesis was performed. The images were evaluated with
computer-aided detection.

[R CC synth-2D]
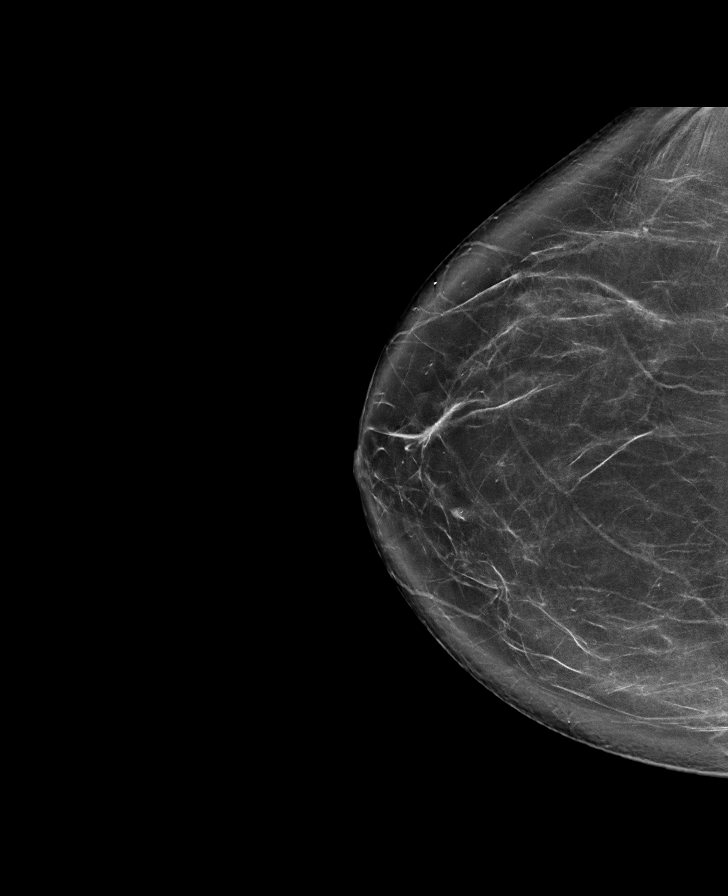

[L MLO synth-2D]
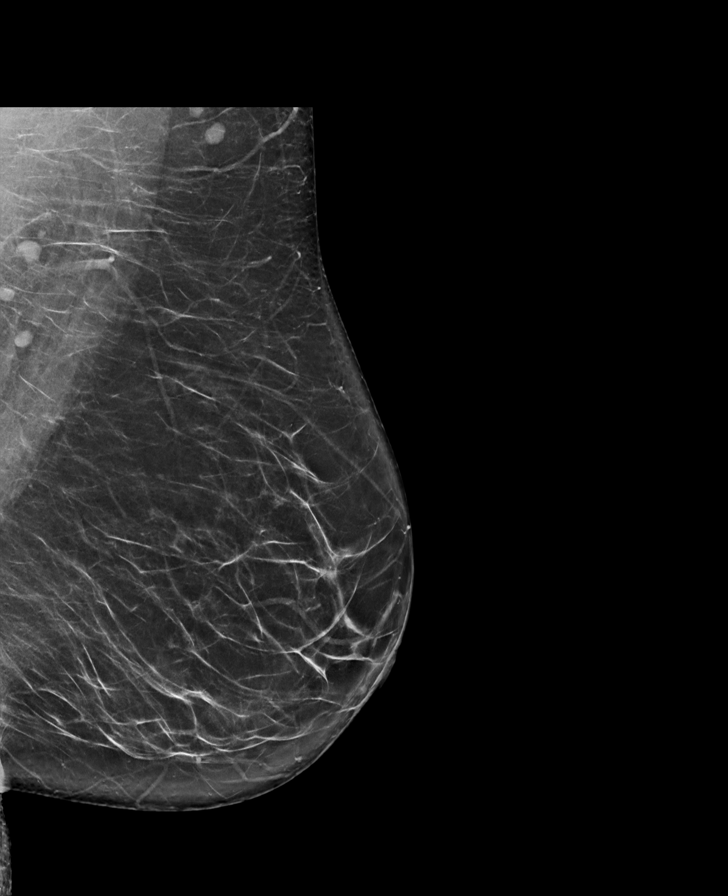

[R MLO synth-2D]
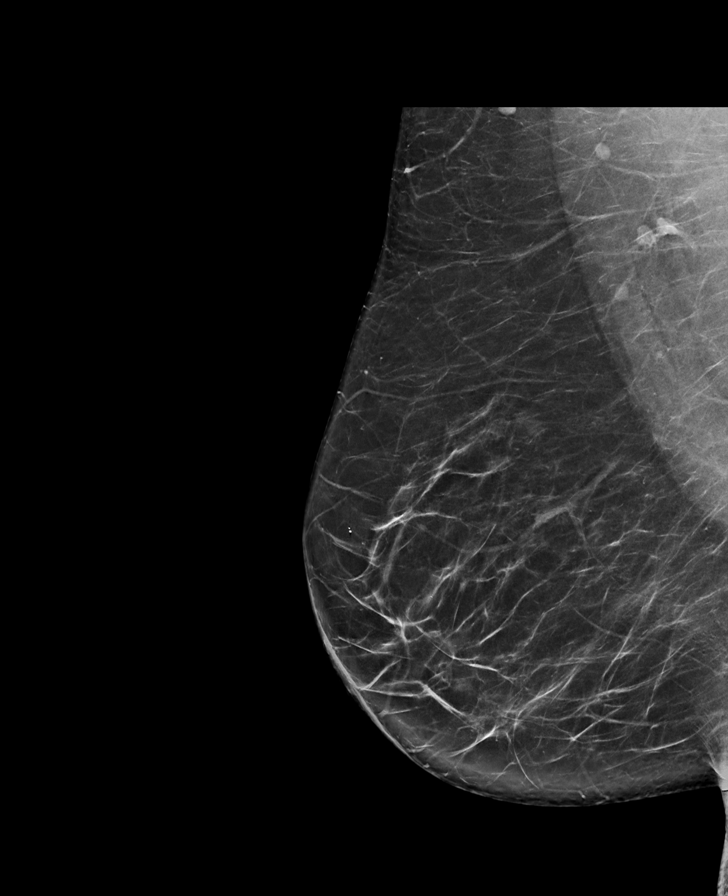

[L CC synth-2D]
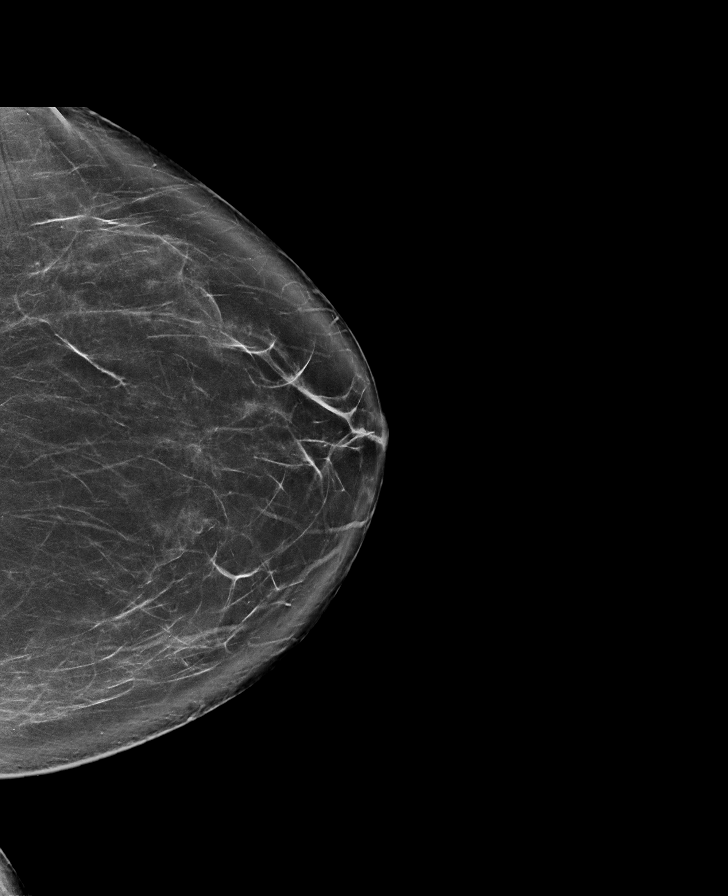

[R CC tomo · tomo slice 47/93.0]
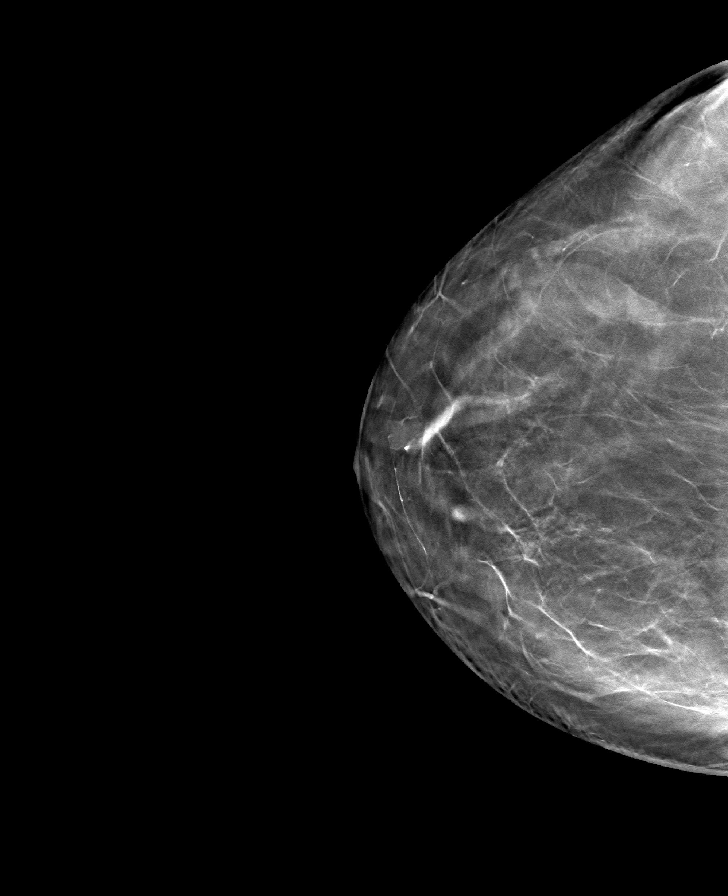

[L CC tomo · tomo slice 45/89.0]
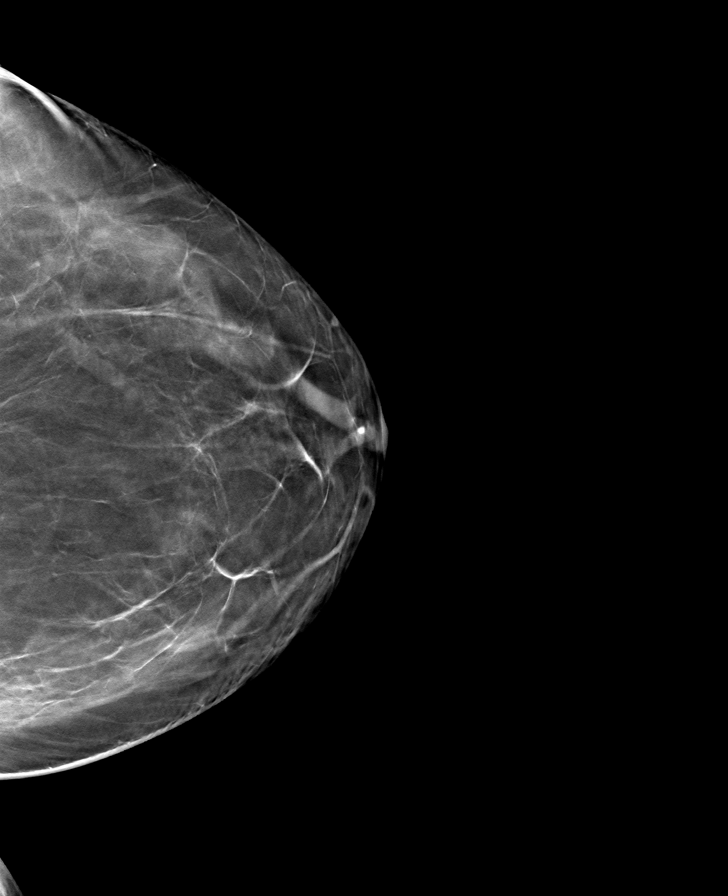

[R MLO tomo · tomo slice 45/88.0]
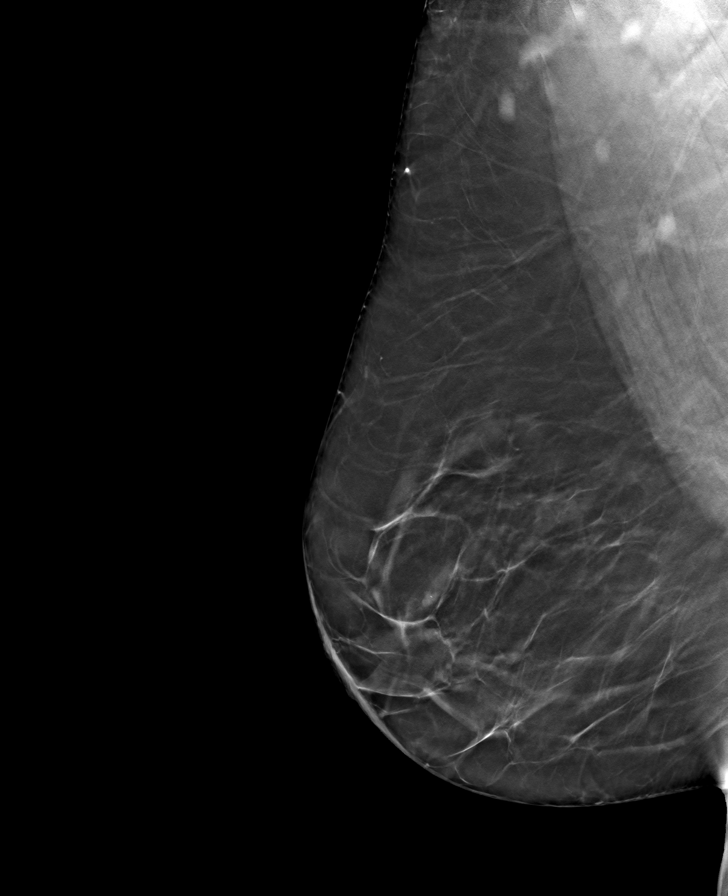

[L MLO tomo · tomo slice 43/86.0]
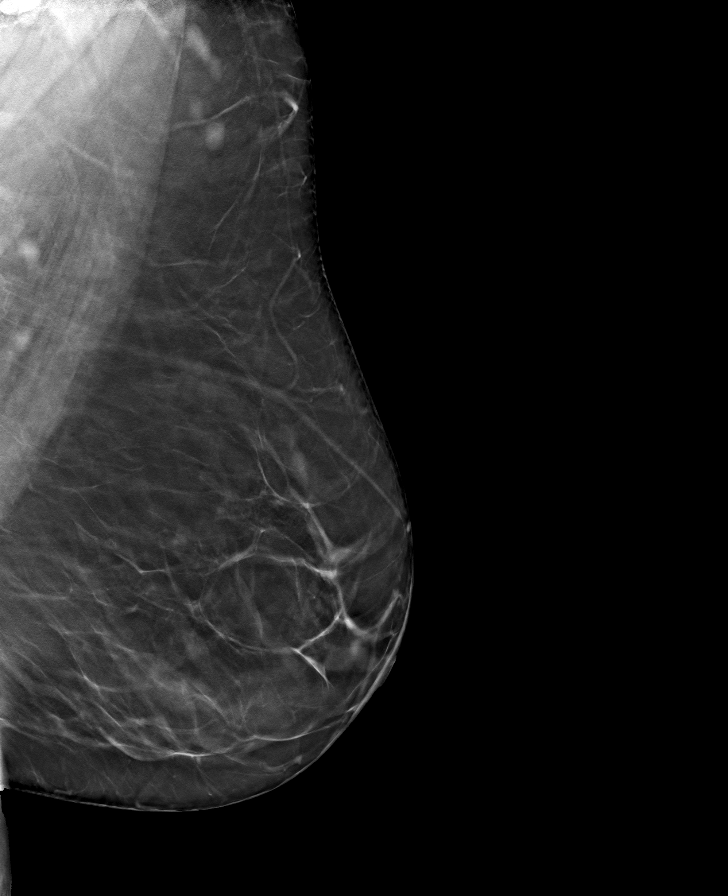

[8 of 24 positions shown; findings below may reference images not displayed]

ACR Breast Density Category b: There are scattered areas of
fibroglandular density.
FINDINGS: There are no findings suspicious for malignancy.
IMPRESSION: No mammographic evidence of malignancy. A result letter of this
screening mammogram will be mailed directly to the patient.

RECOMMENDATION:
Screening mammogram in one year. (Code:51-O-LD2)

BI-RADS CATEGORY  1: Negative.

## 2022-06-12 ENCOUNTER — Ambulatory Visit
Admission: RE | Admit: 2022-06-12 | Discharge: 2022-06-12 | Disposition: A | Discharge status: Home / Self Care | Payer: BC Managed Care – PPO | Source: Ambulatory Visit | Attending: Obstetrics and Gynecology | Admitting: Obstetrics and Gynecology

## 2022-06-12 DIAGNOSIS — Z1231 Encounter for screening mammogram for malignant neoplasm of breast: Secondary | ICD-10-CM

## 2022-09-27 ENCOUNTER — Other Ambulatory Visit: Payer: Self-pay | Admitting: Obstetrics and Gynecology

## 2022-09-27 DIAGNOSIS — N6310 Unspecified lump in the right breast, unspecified quadrant: Secondary | ICD-10-CM

## 2022-10-16 ENCOUNTER — Ambulatory Visit
Admission: RE | Admit: 2022-10-16 | Discharge: 2022-10-16 | Disposition: A | Discharge status: Home / Self Care | Payer: BC Managed Care – PPO | Source: Ambulatory Visit | Attending: Obstetrics and Gynecology | Admitting: Obstetrics and Gynecology

## 2022-10-16 ENCOUNTER — Other Ambulatory Visit: Payer: Self-pay | Admitting: Obstetrics and Gynecology

## 2022-10-16 DIAGNOSIS — N644 Mastodynia: Secondary | ICD-10-CM

## 2022-10-16 DIAGNOSIS — N6310 Unspecified lump in the right breast, unspecified quadrant: Secondary | ICD-10-CM

## 2022-10-31 ENCOUNTER — Encounter: Payer: Self-pay | Admitting: Orthopedic Surgery

## 2022-10-31 ENCOUNTER — Other Ambulatory Visit (INDEPENDENT_AMBULATORY_CARE_PROVIDER_SITE_OTHER): Payer: BC Managed Care – PPO

## 2022-10-31 ENCOUNTER — Ambulatory Visit: Payer: BC Managed Care – PPO | Admitting: Orthopedic Surgery

## 2022-10-31 VITALS — BP 113/78 | HR 88 | Ht 63.0 in | Wt 170.2 lb

## 2022-10-31 DIAGNOSIS — M545 Low back pain, unspecified: Secondary | ICD-10-CM

## 2022-10-31 DIAGNOSIS — M542 Cervicalgia: Secondary | ICD-10-CM

## 2022-10-31 DIAGNOSIS — M5416 Radiculopathy, lumbar region: Secondary | ICD-10-CM | POA: Diagnosis not present

## 2022-10-31 NOTE — Progress Notes (Signed)
Orthopedic Spine Surgery Office Note  Assessment: Patient is a 47 y.o. female with low back pain that radiates into the right buttock and posterior thigh   Plan: -Explained that initially conservative treatment is tried as a significant number of patients may experience relief with these treatment modalities. Discussed that the conservative treatments include:  -activity modification  -physical therapy  -over the counter pain medications  -medrol dosepak  -steroid injections -Patient has tried Tylenol, NSAIDs, dry needling, chiropractor -Patient has tried conservative treatments for over 6 weeks now without any relief, so recommended MRI of the lumbar spine to evaluate for radiculopathy -Talked about steroid injection as a another nonoperative treatment to try after the MRI -Patient should return to office in 4 weeks, x-rays at next visit: None   Patient expressed understanding of the plan and all questions were answered to the patient's satisfaction.   ___________________________________________________________________________   History:  Patient is a 47 y.o. female who presents today for cervical and lumbar spine.  Patient has had history of microdiscectomy in 2008.  She did well and her leg pain resolved after that surgery.  Within the last year though, she has noted pain in her low back.  She feels it is worse if she is sitting or leaning forward.  She feels it gets better if she stands up or lays down.  She has developed pain radiating into her right buttock and right posterior thigh.  It does not radiate past the knee.  She is not having any symptoms in the left lower extremity.  There is no trauma or injury that preceded the onset of this pain.  In regards to her neck, she feels it popping and cracking at times.  She has pain in the lower aspect of the cervical spine.  She feels that the chiropractor has helped her somewhat with this pain unlike the lumbar spine.  She does not have  any pain radiating into either upper extremity.   Weakness: Denies Difficulty with fine motor skills (e.g., buttoning shirts, handwriting): Denies Symptoms of imbalance: Denies Paresthesias and numbness: Yes, sometimes fingers get paresthesias and numbness bilaterally.  No other numbness or paresthesias Bowel or bladder incontinence: Denies Saddle anesthesia: Denies  Treatments tried: Tylenol, NSAIDs, dry needling, chiropractor  Review of systems: Denies fevers and chills, night sweats, unexplained weight loss, pain that wakes her at night. History of vaginal cancer  Past medical history: GERD Vaginal cancer  Allergies: ciprofloxacin, oxycodone, prednisone  Past surgical history:  Dilation and curettage of uterus L4/5 microdiscectomy Tubal ligation Hysterectomy Wisdom tooth extraction  Social history: Denies use of nicotine product (smoking, vaping, patches, smokeless) Alcohol use: Denies Denies recreational drug use  Physical Exam:  BMI of 30.2  General: no acute distress, appears stated age Neurologic: alert, answering questions appropriately, following commands Respiratory: unlabored breathing on room air, symmetric chest rise Psychiatric: appropriate affect, normal cadence to speech   MSK (spine):  -Strength exam      Left  Right Grip strength               5/5  5/5 Interosseus   5/5   5/5 Wrist extension  5/5  5/5 Wrist flexion   5/5  5/5 Elbow flexion   5/5  5/5 Deltoid    5/5  5/5  EHL    5/5  5/5 TA    5/5  5/5 GSC    5/5  5/5 Knee extension  5/5  5/5 Hip flexion   5/5  5/5  -  Sensory exam    Sensation intact to light touch in L3-S1 nerve distributions of bilateral lower extremities  Sensation intact to light touch in C5-T1 nerve distributions of bilateral upper extremities  -Brachioradialis DTR: 2/4 on the left, 2/4 on the right -Biceps DTR: 2/4 on the left, 2/4 on the right -Achilles DTR: 2/4 on the left, 2/4 on the right -Patellar tendon  DTR: 2/4 on the left, 2/4 on the right  -Spurling: Negative bilaterally -Hoffman sign: Negative bilaterally -Clonus: No beats bilaterally -Interosseous wasting: None seen -Grip and release test: Negative -Romberg: Negative -Gait: Normal  Left shoulder exam: No pain through range of motion Right shoulder exam: No pain through range of motion  Tinel's at wrist: Negative bilaterally Phalen's at wrist: Negative bilaterally Durkan's: Negative bilaterally  Tinel's at elbow: Negative bilaterally  -Left hip exam: No pain through range of motion, negative Stinchfield, negative FABER -Right hip exam: No pain through range of motion, negative Stinchfield, negative FABER  -Had pain with pressure over the L5-S1 area that improved with tensing of the lumbar extensors  Imaging: XR of the cervical spine from 10/31/2022 was independently reviewed and interpreted, showing disc height loss with anterior osteophyte formation at C4/5 and at C5/6.  No fracture or dislocation seen.  No evidence of instability seen on flexion/extension views.  Lordotic alignment.  XR of the lumbar spine from 10/31/2022 was independently reviewed and interpreted, showing disc height loss with anterior osteophyte formation at L5/S1.  No other significant degenerative changes.  No evidence of instability on flexion/extension views.  No fracture or dislocation seen.   Patient name: Maria Velasquez Patient MRN: [id number] Date of visit: 10/31/22

## 2022-11-09 ENCOUNTER — Ambulatory Visit (HOSPITAL_COMMUNITY)
Admission: RE | Admit: 2022-11-09 | Discharge: 2022-11-09 | Disposition: A | Discharge status: Home / Self Care | Payer: BC Managed Care – PPO | Source: Ambulatory Visit | Attending: Orthopedic Surgery | Admitting: Orthopedic Surgery

## 2022-11-09 DIAGNOSIS — M5416 Radiculopathy, lumbar region: Secondary | ICD-10-CM

## 2022-11-13 ENCOUNTER — Ambulatory Visit (INDEPENDENT_AMBULATORY_CARE_PROVIDER_SITE_OTHER): Payer: BC Managed Care – PPO | Admitting: Orthopedic Surgery

## 2022-11-13 DIAGNOSIS — M5416 Radiculopathy, lumbar region: Secondary | ICD-10-CM

## 2022-11-14 NOTE — Progress Notes (Signed)
Orthopedic Spine Surgery Office Note  Assessment: Patient is a 47 y.o. female with low back pain that radiates into the right buttock and posterior thigh. 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 Likely discogenic back pain at L5/S1   Plan: -Explained that initially conservative treatment is tried as a significant number of patients may experience relief with these treatment modalities. Discussed that the conservative treatments include:  -activity modification  -physical therapy  -over the counter pain medications  -medrol dosepak  -lumbar steroid injections -Patient has tried tylenol, NSAIDs, dry needling, chiropractor -Recommended diagnostic/therapeutic injection at L4/5 -Patient should return to office in 4 weeks, x-rays at next visit: none   Patient expressed understanding of the plan and all questions were answered to the patient's satisfaction.   ___________________________________________________________________________  History: Patient is a 47 y.o. female who has been previously seen in the office for symptoms consistent with lumbar radiculopathy. Her symptoms are unchanged since the last time she was seen. She is still having low back pain that radiates into her right buttock and posterior thigh. It does not radiate past the knee. No symptoms in the left lower extremity. Denies paresthesias and numbness.   Previous treatments: tylenol, NSAIDs, dry needling, chiropractor  Physical Exam:  General: no acute distress, appears stated age Neurologic: alert, answering questions appropriately, following commands Respiratory: unlabored breathing on room air, symmetric chest rise Psychiatric: appropriate affect, normal cadence to speech   MSK (spine):  -Strength exam      Left  Right EHL    5/5  5/5 TA    5/5  5/5 GSC    5/5  5/5 Knee extension  5/5  5/5 Hip flexion   5/5  5/5  -Sensory exam    Sensation intact to light touch in L3-S1 nerve distributions of  bilateral lower extremities  -Achilles DTR: 2/4 on the left, 2/4 on the right -Patellar tendon DTR: 2/4 on the left, 2/4 on the right  -Straight leg raise: negative bilaterally -Femoral nerve stretch test: negative bilaterally -Clonus: no beats bilaterally  Imaging: XR of the lumbar spine from 10/31/2022 was previously independently reviewed and interpreted, showing disc height loss with anterior osteophyte formation at L5/S1.  No other significant degenerative changes.  No evidence of instability on flexion/extension views.  No fracture or dislocation seen.   MRI of the lumbar spine from 11/09/2022 was independently reviewed and interpreted, showing DDD at L5/S1 with modic changes. Bilateral foraminal stenosis at L5/S1. There is a small right paracentral disc herniation at L4/5.    Patient name: Maria Velasquez Patient MRN: [id number] Date of visit: 11/14/22

## 2022-11-20 ENCOUNTER — Telehealth: Payer: Self-pay | Admitting: Orthopedic Surgery

## 2022-11-20 MED ORDER — HYDROMORPHONE HCL 2 MG PO TABS: 2.0000 mg | ORAL_TABLET | ORAL | 0 refills | Status: AC | PRN

## 2022-11-20 NOTE — Telephone Encounter (Signed)
Pending response of next message

## 2022-11-20 NOTE — Addendum Note (Signed)
Addended by: Willia Craze on: 11/20/2022 05:12 PM   Modules accepted: Orders

## 2022-11-20 NOTE — Telephone Encounter (Signed)
Patient is requesting pain medication now being the pain is getting to be too much percocet does not work for her she has been taking OTC ibuprofen but she feels she needs something a little stronger Rx can be sent to [facility]

## 2022-11-20 NOTE — Telephone Encounter (Signed)
Please see other message.

## 2022-11-20 NOTE — Telephone Encounter (Signed)
Can you please call and schedule injection, Looks like it was approved on 11/14/22  I called and advised meds were sent in---

## 2022-11-20 NOTE — Telephone Encounter (Signed)
2nd Call----Patient called in stating she would like pain medicine not percocet she is allergic and they not work, she just needs something temporary until her injection gets approved please advise, Dr Christell Constant said he would prescribe meds but has not sone so

## 2022-11-22 NOTE — Telephone Encounter (Signed)
Spoke with patient and scheduled injection for 12/04/22. Patient aware driver needed  

## 2022-11-28 ENCOUNTER — Ambulatory Visit: Payer: BC Managed Care – PPO | Admitting: Orthopedic Surgery

## 2022-12-04 ENCOUNTER — Other Ambulatory Visit: Payer: Self-pay

## 2022-12-04 ENCOUNTER — Ambulatory Visit: Payer: BC Managed Care – PPO | Admitting: Physical Medicine and Rehabilitation

## 2022-12-04 VITALS — BP 123/74 | HR 83

## 2022-12-04 DIAGNOSIS — M5416 Radiculopathy, lumbar region: Secondary | ICD-10-CM | POA: Diagnosis not present

## 2022-12-04 MED ORDER — METHYLPREDNISOLONE ACETATE 80 MG/ML IJ SUSP
80.0000 mg | Freq: Once | INTRAMUSCULAR | Status: AC
Start: 2022-12-04 — End: 2022-12-04
  Administered 2022-12-04: 80 mg

## 2022-12-04 NOTE — Patient Instructions (Signed)

## 2022-12-04 NOTE — Progress Notes (Signed)
Functional Pain Scale - descriptive words and definitions  Uncomfortable (3)  Pain is present but can complete all ADL's/sleep is slightly affected and passive distraction only gives marginal relief. Mild range order  Average Pain  varies   +Driver, -BT, -Dye Allergies.  Lower back pain on the right side

## 2022-12-09 ENCOUNTER — Telehealth: Payer: Self-pay | Admitting: Orthopedic Surgery

## 2022-12-09 NOTE — Telephone Encounter (Signed)
I called and lmom that injection can take up to 2 weeks to take full effect, and that she should keep her appointment for 12/11/22

## 2022-12-09 NOTE — Telephone Encounter (Signed)
Pt called stating she is requesting a call back. She states injection in last appt did not work and unsure if she need to come in for follow up. Please call pt at [phone]

## 2022-12-11 ENCOUNTER — Ambulatory Visit (INDEPENDENT_AMBULATORY_CARE_PROVIDER_SITE_OTHER): Payer: BC Managed Care – PPO | Admitting: Orthopedic Surgery

## 2022-12-11 DIAGNOSIS — M5459 Other low back pain: Secondary | ICD-10-CM

## 2022-12-11 MED ORDER — TRAZODONE HCL 50 MG PO TABS: 50.0000 mg | ORAL_TABLET | Freq: Every evening | ORAL | 0 refills | Status: DC | PRN

## 2022-12-11 NOTE — Progress Notes (Signed)
Orthopedic Spine Surgery Office Note   Assessment: Patient is a 47 y.o. female with low back pain that radiates into the right buttock and posterior thigh. 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 Likely discogenic back pain at L5/S1     Plan: -Patient has tried tylenol, NSAIDs, dry needling, chiropractor, inversion table, heating pad, ice, narcotics -Talked about L5/S1 ALIF and PSIF as a treatment option.  Explained some of the complications associated with the surgery particularly adjacent segment disease given her age.  I also told her that surgery for discogenic pain is not as reliable as it is for radicular pain or neurogenic claudication -Discussed pain management as another treatment option -Prescribed trazodone to help her with sleep -Patient should return to office in 4 weeks, x-rays at next visit: none     Patient expressed understanding of the plan and all questions were answered to the patient's satisfaction.    ___________________________________________________________________________   History: Patient is a 47 y.o. female who has been previously seen in the office for low back pain that radiates into the right buttock and posterior thigh.  Most of her pain is localized to the low back.  Is worse with upright activity.  It improves with laying down or extending the low back.  It is at its worst with bending activities.  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  Her radiating leg pain is not as bad.  After her last appointment, she had a lumbar injection that did not provide her with any significant relief.   Previous treatments: tylenol, NSAIDs, dry needling, chiropractor, inversion table, heating pad, ice, narcotics    Physical Exam:   General: no acute distress, appears stated age Neurologic: alert, answering questions appropriately, following commands Respiratory: unlabored breathing  on room air, symmetric chest rise Psychiatric: appropriate affect, normal cadence to speech     MSK (spine):   -Strength exam                                                   Left                  Right EHL                              5/5                  5/5 TA                                 5/5                  5/5 GSC                             5/5                  5/5 Knee extension            5/5                  5/5 Hip flexion  5/5                  5/5   -Sensory exam                           Sensation intact to light touch in L3-S1 nerve distributions of bilateral lower extremities   -Achilles DTR: 2/4 on the left, 2/4 on the right -Patellar tendon DTR: 2/4 on the left, 2/4 on the right   -Straight leg raise: negative bilaterally -Femoral nerve stretch test: negative bilaterally -Clonus: no beats bilaterally  -Had pain with palpation and pressure at the area of L5/S1 that slightly improved with tension of the lumbar paraspinal muscles   Imaging: XR of the lumbar spine from 10/31/2022 was previously independently reviewed and interpreted, showing disc height loss with anterior osteophyte formation at L5/S1.  No other significant degenerative changes.  No evidence of instability on flexion/extension views.  No fracture or dislocation seen.    MRI of the lumbar spine from 11/09/2022 was previously independently reviewed and interpreted, showing DDD at L5/S1 with modic changes. Bilateral foraminal stenosis at L5/S1. There is a small right paracentral disc herniation at L4/5.      Patient name: Maria Velasquez Patient MRN: [id number] Date of visit: 12/11/22

## 2022-12-16 NOTE — Procedures (Signed)
Lumbosacral Transforaminal Epidural Steroid Injection - Sub-Pedicular Approach with Fluoroscopic Guidance  Patient: Maria Velasquez      Date of Birth: [birth year] MRN: [id number] PCP: [name], PA-C      Visit Date: 12/04/2022   Universal Protocol:    Date/Time: 12/04/2022  Consent Given By: the patient  Position: PRONE  Additional Comments: Vital signs were monitored before and after the procedure. Patient was prepped and draped in the usual sterile fashion. The correct patient, procedure, and site was verified.   Injection Procedure Details:   Procedure diagnoses: Lumbar radiculopathy [M54.16]    Meds Administered:  Meds ordered this encounter  Medications   methylPREDNISolone acetate (DEPO-MEDROL) injection 80 mg    Laterality: Right  Location/Site: L4  Needle:5.0 in., 22 ga.  Short bevel or Quincke spinal needle  Needle Placement: Transforaminal  Findings:    -Comments: Excellent flow of contrast along the nerve, nerve root and into the epidural space.  Procedure Details: After squaring off the end-plates to get a true AP view, the C-arm was positioned so that an oblique view of the foramen as noted above was visualized. The target area is just inferior to the "nose of the scotty dog" or sub pedicular. The soft tissues overlying this structure were infiltrated with 2-3 ml. of 1% Lidocaine without Epinephrine.  The spinal needle was inserted toward the target using a "trajectory" view along the fluoroscope beam.  Under AP and lateral visualization, the needle was advanced so it did not puncture dura and was located close the 6 O'Clock position of the pedical in AP tracterory. Biplanar projections were used to confirm position. Aspiration was confirmed to be negative for CSF and/or blood. A 1-2 ml. volume of Isovue-250 was injected and flow of contrast was noted at each level. Radiographs were obtained for documentation purposes.   After attaining the desired flow  of contrast documented above, a 0.5 to 1.0 ml test dose of 0.25% Marcaine was injected into each respective transforaminal space.  The patient was observed for 90 seconds post injection.  After no sensory deficits were reported, and normal lower extremity motor function was noted,   the above injectate was administered so that equal amounts of the injectate were placed at each foramen (level) into the transforaminal epidural space.   Additional Comments:  No complications occurred Dressing: 2 x 2 sterile gauze and Band-Aid    Post-procedure details: Patient was observed during the procedure. Post-procedure instructions were reviewed.  Patient left the clinic in stable condition.

## 2022-12-16 NOTE — Progress Notes (Signed)
Maria Velasquez - 47 y.o. female MRN [id number]  Date of birth: 07/23/1975  Office Visit Note: Visit Date: 12/04/2022 PCP: [name], PA-C Referred by: London Sheer, MD  Subjective: Chief Complaint  Patient presents with   Lower Back - Pain   HPI:  Maria Velasquez is a 47 y.o. female who comes in today at the request of Dr. Willia Craze for planned Right L4-5 Lumbar Transforaminal epidural steroid injection with fluoroscopic guidance.  The patient has failed conservative care including home exercise, medications, time and activity modification.  This injection will be diagnostic and hopefully therapeutic.  Please see requesting physician notes for further details and justification.   ROS Otherwise per HPI.  Assessment & Plan: Visit Diagnoses:    ICD-10-CM   1. Lumbar radiculopathy  M54.16 XR C-ARM NO REPORT    Epidural Steroid injection    methylPREDNISolone acetate (DEPO-MEDROL) injection 80 mg      Plan: No additional findings.   Meds & Orders:  Meds ordered this encounter  Medications   methylPREDNISolone acetate (DEPO-MEDROL) injection 80 mg    Orders Placed This Encounter  Procedures   XR C-ARM NO REPORT   Epidural Steroid injection    Follow-up: No follow-ups on file.   Procedures: No procedures performed  Lumbosacral Transforaminal Epidural Steroid Injection - Sub-Pedicular Approach with Fluoroscopic Guidance  Patient: Maria Velasquez      Date of Birth: 03-22-1976 MRN: [id number] PCP: [name], PA-C      Visit Date: 12/04/2022   Universal Protocol:    Date/Time: 12/04/2022  Consent Given By: the patient  Position: PRONE  Additional Comments: Vital signs were monitored before and after the procedure. Patient was prepped and draped in the usual sterile fashion. The correct patient, procedure, and site was verified.   Injection Procedure Details:   Procedure diagnoses: Lumbar radiculopathy [M54.16]    Meds Administered:  Meds  ordered this encounter  Medications   methylPREDNISolone acetate (DEPO-MEDROL) injection 80 mg    Laterality: Right  Location/Site: L4  Needle:5.0 in., 22 ga.  Short bevel or Quincke spinal needle  Needle Placement: Transforaminal  Findings:    -Comments: Excellent flow of contrast along the nerve, nerve root and into the epidural space.  Procedure Details: After squaring off the end-plates to get a true AP view, the C-arm was positioned so that an oblique view of the foramen as noted above was visualized. The target area is just inferior to the "nose of the scotty dog" or sub pedicular. The soft tissues overlying this structure were infiltrated with 2-3 ml. of 1% Lidocaine without Epinephrine.  The spinal needle was inserted toward the target using a "trajectory" view along the fluoroscope beam.  Under AP and lateral visualization, the needle was advanced so it did not puncture dura and was located close the 6 O'Clock position of the pedical in AP tracterory. Biplanar projections were used to confirm position. Aspiration was confirmed to be negative for CSF and/or blood. A 1-2 ml. volume of Isovue-250 was injected and flow of contrast was noted at each level. Radiographs were obtained for documentation purposes.   After attaining the desired flow of contrast documented above, a 0.5 to 1.0 ml test dose of 0.25% Marcaine was injected into each respective transforaminal space.  The patient was observed for 90 seconds post injection.  After no sensory deficits were reported, and normal lower extremity motor function was noted,   the above injectate was administered so that equal amounts  of the injectate were placed at each foramen (level) into the transforaminal epidural space.   Additional Comments:  No complications occurred Dressing: 2 x 2 sterile gauze and Band-Aid    Post-procedure details: Patient was observed during the procedure. Post-procedure instructions were  reviewed.  Patient left the clinic in stable condition.    Clinical History: MRI LUMBAR SPINE WITHOUT CONTRAST   TECHNIQUE: Multiplanar, multisequence MR imaging of the lumbar spine was performed. No intravenous contrast was administered.   COMPARISON:  Prior radiograph from 10/31/2022 as well as MRI from 12/03/2008.   FINDINGS: Segmentation: Standard. Lowest well-formed disc space labeled the L5-S1 level.   Alignment: Physiologic with preservation of the normal lumbar lordosis. No listhesis.   Vertebrae: Vertebral body height maintained without acute or chronic fracture. Bone marrow signal intensity within normal limits. Few scattered benign hemangiomata noted. No worrisome osseous lesions. Degenerative reactive endplate changes present about the L5-S1 interspace. No abnormal marrow edema.   Conus medullaris and cauda equina: Conus extends to the L1 level. Conus and cauda equina appear normal.   Paraspinal and other soft tissues: Chronic postoperative scarring present within the lower posterior paraspinous soft tissues. No acute finding. Visualized visceral structures within normal limits.   Disc levels:   L1-2:  Unremarkable.   L2-3:  Unremarkable.   L3-4: Normal interspace. Mild bilateral facet hypertrophy. No spinal stenosis. Foramina remain patent.   L4-5: Degenerative intervertebral disc space narrowing with disc desiccation. Postoperative changes from prior left-sided laminectomy with micro discectomy. There is a superimposed right subarticular disc protrusion with annular fissure at this level (series 8, image 24). Protruding disc closely approximates the descending right L5 nerve root. Superimposed bilateral facet hypertrophy. Resultant mild to moderate right lateral recess stenosis. Central canal remains patent. No significant foraminal stenosis.   L5-S1: Degenerative vertebral disc space narrowing with disc desiccation and diffuse disc bulge.  Associated reactive endplate spurring. Postoperative changes from prior left hemi laminectomy with micro discectomy. Superimposed small central disc protrusion closely approximates and/or contacts the descending S1 nerve roots (series 9, image 29). Mild facet hypertrophy. Residual mild narrowing of the left lateral recess. Central canal remains patent. No significant foraminal stenosis.   IMPRESSION: 1. Postoperative changes from prior left posterior decompression with micro discectomy at L4-5 and L5-S1. 2. Right subarticular disc protrusion at L4-5, potentially affecting the descending right L5 nerve root. 3. Small residual and/or recurrent central disc protrusion at L5-S1, closely approximating and potentially affecting either of the descending S1 nerve roots.     Electronically Signed   By: Rise Mu M.D.   On: 11/15/2022 17:00     Objective:  VS:  HT:    WT:   BMI:     BP:123/74  HR:83bpm  TEMP: ( )  RESP:  Physical Exam Vitals and nursing note reviewed.  Constitutional:      General: She is not in acute distress.    Appearance: Normal appearance. She is not ill-appearing.  HENT:     Head: Normocephalic and atraumatic.     Right Ear: External ear normal.     Left Ear: External ear normal.  Eyes:     Extraocular Movements: Extraocular movements intact.  Cardiovascular:     Rate and Rhythm: Normal rate.     Pulses: Normal pulses.  Pulmonary:     Effort: Pulmonary effort is normal. No respiratory distress.  Abdominal:     General: There is no distension.     Palpations: Abdomen is soft.  Musculoskeletal:  General: Tenderness present.     Cervical back: Neck supple.     Right lower leg: No edema.     Left lower leg: No edema.     Comments: Patient has good distal strength with no pain over the greater trochanters.  No clonus or focal weakness.  Skin:    Findings: No erythema, lesion or rash.  Neurological:     General: No focal deficit  present.     Mental Status: She is alert and oriented to person, place, and time.     Sensory: No sensory deficit.     Motor: No weakness or abnormal muscle tone.     Coordination: Coordination normal.  Psychiatric:        Mood and Affect: Mood normal.        Behavior: Behavior normal.      Imaging: No results found.

## 2022-12-31 NOTE — Progress Notes (Unsigned)
Consulting Department:  ***  Primary Physician:  [name], PA-C  Chief Complaint:  ***    History of Present Illness: 12/31/2022 Maria Velasquez is a 47 y.o. female who presents with the chief complaint of ***  Lower  back pain that radiates into the right buttock and posterior thigh. Duration: *** Location: *** Quality: *** Provoking: aggravated by *** Alleviating: made better by *** Weakness: ***none Timing: ***none Bowel/Bladder: no dysfunction  Maria Velasquez has ***no symptoms of cervical myelopathy.  Conservative measures: Physical therapy: ***? Denies  Medications: ***Tylenol, NSAIDs Injections: *** 12/04/22 Right L 4-5 ( no relief)   The symptoms are causing a significant impact on the patient's life.   Review of Systems:  A 10 point review of systems is negative, except for the pertinent positives and negatives detailed in the HPI.  Past Medical History: Past Medical History:  Diagnosis Date   Cancer (HCC)    Vaginal   Headache    otc med prn   Heartburn    History of cervical dysplasia    History of uterine fibroid    Lipoma    Vaginal dysplasia     Past Surgical History: Past Surgical History:  Procedure Laterality Date   BIOPSY  02/09/2021   Procedure: BIOPSY;  Surgeon: Lanelle Bal, DO;  [facility];  Service: Endoscopy;;   CO2 LASER APPLICATION N/A 10/26/2020   Procedure: CO2 LASER APPLICATION OF VAGINA;  Surgeon: Carver Fila, MD;  Location: [facility];  Service: Gynecology;  Laterality: N/A;   COLONOSCOPY WITH PROPOFOL N/A 02/09/2021   Procedure: COLONOSCOPY WITH PROPOFOL;  Surgeon: Lanelle Bal, DO;  [facility];  Service: Endoscopy;  Laterality: N/A;  10:00am   DILATION AND CURETTAGE OF UTERUS  08/05/2009  @[facility]    missed abortion   ESOPHAGOGASTRODUODENOSCOPY (EGD) WITH PROPOFOL N/A 02/09/2021   Procedure: ESOPHAGOGASTRODUODENOSCOPY (EGD) WITH PROPOFOL;  Surgeon: Lanelle Bal,  DO;  [facility];  Service: Endoscopy;  Laterality: N/A;   LAPAROSCOPIC SALPINGO OOPHERECTOMY Right 08-28-2018  @[facility]    ovarian torsion   LAPAROSCOPIC TUBAL LIGATION W/ FILCHIE CLIPS Bilateral 03-20-2010  @[facility]    LUMBAR DISC SURGERY  01-30-2007  @[facility]    L4-- L5   ROBOTIC ASSISTED BILATERAL SALPINGO OOPHERECTOMY Bilateral 11/16/2014   Procedure: ROBOTIC ASSISTED BILATERAL SALPINGECTOMY;  Surgeon: Carrington Clamp, MD;  Location: [facility];  Service: Gynecology;  Laterality: Bilateral;   ROBOTIC ASSISTED TOTAL HYSTERECTOMY N/A 11/16/2014   w/ BS   VULVA Ples Specter BIOPSY N/A 10/26/2020   Procedure: VAGINAL BIOPSY;  Surgeon: Carver Fila, MD;  Location: [facility];  Service: Gynecology;  Laterality: N/A;   WISDOM TOOTH EXTRACTION  1997    Allergies: Allergies as of 01/01/2023 - Review Complete 10/31/2022  Allergen Reaction Noted   Ciprofloxacin  10/04/2020   Oxycodone Hives, Itching, and Other (See Comments) 11/02/2014   Prednisone Other (See Comments) 08/28/2018    Medications:  Current Outpatient Medications:    ALPRAZolam (XANAX) 0.5 MG tablet, Take 0.5 mg by mouth daily as needed for anxiety., Disp: , Rfl:    ibuprofen (ADVIL) 800 MG tablet, Take 1 tablet (800 mg total) by mouth every 8 (eight) hours as needed for moderate pain. For AFTER surgery (Patient taking differently: Take 800 mg by mouth daily as needed for moderate pain.), Disp: 30 tablet, Rfl: 1   Multiple Vitamins-Minerals (ADULT ONE DAILY GUMMIES PO), Take 2 tablets by mouth in the morning., Disp: , Rfl:  nitrofurantoin, macrocrystal-monohydrate, (MACROBID) 100 MG capsule, Take 100 mg by mouth 2 (two) times daily., Disp: , Rfl:    omeprazole (PRILOSEC) 40 MG capsule, TAKE ONE CAPSULE BY MOUTH DAILY 30 MINUTES BEFORE breakfast, Disp: 30 capsule, Rfl: 11   polyethylene glycol-electrolytes (NULYTELY) 420 g solution, As directed, Disp: 4000 mL, Rfl: 0   traZODone (DESYREL) 50 MG tablet, Take 1 tablet  (50 mg total) by mouth at bedtime as needed for sleep., Disp: 30 tablet, Rfl: 0   Social History: Social History   Tobacco Use   Smoking status: Never   Smokeless tobacco: Never  Vaping Use   Vaping Use: Never used  Substance Use Topics   Alcohol use: Yes    Comment: occasional wine   Drug use: Never    Family Medical History: Family History  Problem Relation Age of Onset   Breast cancer Mother    Pancreatic cancer Maternal Grandfather        early 29s   Colon cancer Paternal Grandfather        mid 7s    Physical Examination: There were no vitals filed for this visit.   General: Patient is well developed, well nourished, calm, collected, and in no apparent distress.  NEUROLOGICAL:  General: In no acute distress.   Awake, alert, oriented to person, place, and time.  Pupils equal round and reactive to light.  Facial tone is symmetric.  Tongue protrusion is midline.  There is no pronator drift.  ROM of spine: ***full.  Palpation of spine: ***nontender.    Strength: Side Biceps Triceps Deltoid Interossei Grip Wrist Ext. Wrist Flex.  R 5 5 5 5 5 5 5   L 5 5 5 5 5 5 5    Side Iliopsoas Quads Hamstring PF DF EHL  R 5 5 5 5 5 5   L 5 5 5 5 5 5     Bilateral upper and lower extremity sensation is intact to light touch. Reflexes are ***2+ and symmetric at the biceps, triceps, brachioradialis, patella and achilles. Hoffman's is absent.  Clonus is not present.  Toes are down-going.    Gait is normal.  No difficulty with tandem gait.    Imaging: ***  I have personally reviewed the images and agree with the above interpretation.  Labs:    Latest Ref Rng & Units 01/01/2021    4:25 PM 08/28/2018    1:54 AM 12/29/2017    3:05 AM  CBC  WBC 3.8 - 10.8 Thousand/uL 8.6  17.3  11.0   Hemoglobin 11.7 - 15.5 g/dL 16.1  09.6  04.5   Hematocrit 35.0 - 45.0 % 45.5  43.8  42.5   Platelets 140 - 400 Thousand/uL 307  321  272        Assessment and Plan: [name] is a  pleasant 47 y.o. female with ***  I have discussed the condition with the patient, including showing the radiographs and discussing treatment options in layman's terms.  The patient may benefit from conservative management.  Thus, I have recommended the following: ***.  I will see the patient back in a few weeks to gauge progress.     Lovenia Kim, MD/MSCR Dept. of Neurosurgery

## 2023-01-01 ENCOUNTER — Ambulatory Visit (INDEPENDENT_AMBULATORY_CARE_PROVIDER_SITE_OTHER): Payer: BC Managed Care – PPO | Admitting: Neurosurgery

## 2023-01-01 ENCOUNTER — Encounter: Payer: Self-pay | Admitting: Neurosurgery

## 2023-01-01 VITALS — BP 124/80 | Ht 63.0 in | Wt 175.0 lb

## 2023-01-01 DIAGNOSIS — M5416 Radiculopathy, lumbar region: Secondary | ICD-10-CM | POA: Insufficient documentation

## 2023-01-01 DIAGNOSIS — M5126 Other intervertebral disc displacement, lumbar region: Secondary | ICD-10-CM | POA: Insufficient documentation

## 2023-01-01 DIAGNOSIS — M5116 Intervertebral disc disorders with radiculopathy, lumbar region: Secondary | ICD-10-CM | POA: Diagnosis not present

## 2023-01-02 ENCOUNTER — Telehealth: Payer: Self-pay | Admitting: Physical Medicine and Rehabilitation

## 2023-01-02 NOTE — Telephone Encounter (Signed)
Patient is ready to schedule please advise

## 2023-01-03 ENCOUNTER — Telehealth: Payer: Self-pay | Admitting: Physical Medicine and Rehabilitation

## 2023-01-03 DIAGNOSIS — M5116 Intervertebral disc disorders with radiculopathy, lumbar region: Secondary | ICD-10-CM

## 2023-01-03 DIAGNOSIS — M5416 Radiculopathy, lumbar region: Secondary | ICD-10-CM

## 2023-01-03 NOTE — Telephone Encounter (Signed)
IC to advise could not schedule until auth received. Patient stated she had issues with prior injection and would like this process expedited if possible. Please let me know once auth completed and I will call her to schedule.

## 2023-01-03 NOTE — Telephone Encounter (Signed)
Referral placed today by Dr Alvester Morin. Will need to get auth. Will call patient to advise.

## 2023-01-03 NOTE — Telephone Encounter (Signed)
Referral from  Loreen Freud, MD [facility] Neurosurgery.

## 2023-01-07 ENCOUNTER — Ambulatory Visit: Payer: BC Managed Care – PPO | Attending: Neurosurgery | Admitting: Physical Therapy

## 2023-01-07 ENCOUNTER — Encounter: Payer: Self-pay | Admitting: Physical Therapy

## 2023-01-07 ENCOUNTER — Other Ambulatory Visit: Payer: Self-pay

## 2023-01-07 DIAGNOSIS — M5459 Other low back pain: Secondary | ICD-10-CM | POA: Diagnosis present

## 2023-01-07 DIAGNOSIS — M62838 Other muscle spasm: Secondary | ICD-10-CM | POA: Diagnosis present

## 2023-01-07 DIAGNOSIS — M5416 Radiculopathy, lumbar region: Secondary | ICD-10-CM | POA: Diagnosis not present

## 2023-01-07 DIAGNOSIS — M5126 Other intervertebral disc displacement, lumbar region: Secondary | ICD-10-CM | POA: Insufficient documentation

## 2023-01-07 NOTE — Therapy (Signed)
OUTPATIENT PHYSICAL THERAPY THORACOLUMBAR EVALUATION   Patient Name: Maria Velasquez MRN: [id number] DOB:[birth year], 47 y.o., female Today's Date: 01/07/2023  END OF SESSION:  PT End of Session - 01/07/23 1132     Visit Number 1    Number of Visits 8    Date for PT Re-Evaluation 02/04/23    Authorization Type FOTO.    PT Start Time 1015    PT Stop Time 1102    PT Time Calculation (min) 47 min    Activity Tolerance Patient tolerated treatment well    Behavior During Therapy WFL for tasks assessed/performed             Past Medical History:  Diagnosis Date   Cancer (HCC)    Vaginal   Headache    otc med prn   Heartburn    History of cervical dysplasia    History of uterine fibroid    Lipoma    Vaginal dysplasia    Past Surgical History:  Procedure Laterality Date   BIOPSY  02/09/2021   Procedure: BIOPSY;  Surgeon: Lanelle Bal, DO;  [facility];  Service: Endoscopy;;   CO2 LASER APPLICATION N/A 10/26/2020   Procedure: CO2 LASER APPLICATION OF VAGINA;  Surgeon: Carver Fila, MD;  Location: [facility];  Service: Gynecology;  Laterality: N/A;   COLONOSCOPY WITH PROPOFOL N/A 02/09/2021   Procedure: COLONOSCOPY WITH PROPOFOL;  Surgeon: Lanelle Bal, DO;  [facility];  Service: Endoscopy;  Laterality: N/A;  10:00am   DILATION AND CURETTAGE OF UTERUS  08/05/2009  @[facility]    missed abortion   ESOPHAGOGASTRODUODENOSCOPY (EGD) WITH PROPOFOL N/A 02/09/2021   Procedure: ESOPHAGOGASTRODUODENOSCOPY (EGD) WITH PROPOFOL;  Surgeon: Lanelle Bal, DO;  [facility];  Service: Endoscopy;  Laterality: N/A;   LAPAROSCOPIC SALPINGO OOPHERECTOMY Right 08-28-2018  @[facility]    ovarian torsion   LAPAROSCOPIC TUBAL LIGATION W/ FILCHIE CLIPS Bilateral 03-20-2010  @[facility]    LUMBAR DISC SURGERY  01-30-2007  @[facility]    L4-- L5   ROBOTIC ASSISTED BILATERAL SALPINGO OOPHERECTOMY Bilateral 11/16/2014   Procedure: ROBOTIC ASSISTED BILATERAL  SALPINGECTOMY;  Surgeon: Carrington Clamp, MD;  Location: [facility];  Service: Gynecology;  Laterality: Bilateral;   ROBOTIC ASSISTED TOTAL HYSTERECTOMY N/A 11/16/2014   w/ BS   VULVA Ples Specter BIOPSY N/A 10/26/2020   Procedure: VAGINAL BIOPSY;  Surgeon: Carver Fila, MD;  Location: [facility];  Service: Gynecology;  Laterality: N/A;   WISDOM TOOTH EXTRACTION  1997   Patient Active Problem List   Diagnosis Date Noted   Radiculopathy, lumbar region, right L5 01/01/2023   Herniated nucleus pulposus, L4-5 right 01/01/2023   Pain of upper abdomen 01/01/2021   Gastroesophageal reflux disease 01/01/2021   Bacterial vaginosis 10/05/2020   Vaginal dysplasia 10/05/2020   REFERRING PROVIDER: Ernestine Mcmurray  REFERRING DIAG: Radiculopathy, lumbar region.  Herniated nucleus pulposus, L4-5, right.  Rationale for Evaluation and Treatment: Rehabilitation  THERAPY DIAG:  Other low back pain - Plan: PT plan of care cert/re-cert  Other muscle spasm - Plan: PT plan of care cert/re-cert  ONSET DATE: March 44010.  SUBJECTIVE:  SUBJECTIVE STATEMENT: The patient presents to the clinic today with c/o right-sided low back with radiation into her right buttock, lateral thigh and lateral ankle region. She states the pain began around March of this year.  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  Her pain is rated at a 6/10 but can rise to near severe levels with standing and sitting 10 to 15 minutes.  She needs to weight shift off her right side due to increasing pain.  Heat and ice can help some to decrease her pain.  Her pain is shooting in nature and numb in right lower leg.  She has had Chiropractic care in  the past but will put treatments to her lumbar region on hold at this time.  She had an ESI that really was not helpful.Marland Kitchen      PERTINENT HISTORY:  Prior left lumbar microdiskectomy.  PAIN:  Are you having pain? Yes: NPRS scale: 6/10 Pain location: Right low back, buttock, lateral thigh to ankle. Pain description: As above. Aggravating factors: Sitting, standing, lying on stomach. Relieving factors: As above.  PRECAUTIONS: None  RED FLAGS: None   WEIGHT BEARING RESTRICTIONS: No  FALLS:  Has patient fallen in last 6 months? 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  Get back to where she was before the onset of pain.  OBJECTIVE:   DIAGNOSTIC FINDINGS:  IMPRESSION:  MRI: 1. Postoperative changes from prior left posterior decompression with micro discectomy at L4-5 and L5-S1. 2. Right subarticular disc protrusion at L4-5, potentially affecting the descending right L5 nerve root. 3. Small residual and/or recurrent central disc protrusion at L5-S1, closely approximating and potentially affecting either of the descending S1 nerve roots.  PATIENT SURVEYS:  FOTO    POSTURE: No Significant postural limitations  PALPATION: Very tender to palpation over right lower lumbar/SIJ and proximal gluteal region.  LUMBAR ROM:   Functional movement. LOWER EXTREMITY MMT:    Normal bilateral LE strength.  LUMBAR SPECIAL TESTS:  Equal leg lengths. Some pain increase with a right SLR. (-) FABER test.   Normal LE DTR's.  GAIT: Antalgic in nature.  Patient in obvious pain.  TRANSFERS/TRANSITORY MOVEMENTS:   Slow and cautious due to pain.  TODAY'S TREATMENT:                                                                                                                              DATE: HMP and IFC  at 80-150 Hz on 40% scan x 20 minutes to patient's right LB/SIJ and proximal gluteal region in supine with wedge under knees for comfort.  Normal modality response following removal of modality.    PATIENT EDUCATION:  HOME EXERCISE PROGRAM:  ASSESSMENT:  CLINICAL IMPRESSION: The patient presents to OPPT with c/o right LBP with radiation into her right LE.  She is very tender to palpation over right lower lumbar/SIJ and proximal gluteal region.  She has near severe pain levels with increased sitting and standing.  Her LE DTR's are normal as is her LE strength.  She had increased pain with a right SLR test today.  Transfers/transitory movements are slow and cautious due to pain.  Patient will benefit from skilled physical therapy intervention to address pain and deficits.  OBJECTIVE IMPAIRMENTS: Abnormal gait, decreased activity tolerance, decreased mobility, increased muscle spasms, and pain.   ACTIVITY LIMITATIONS: carrying, lifting, bending, sitting, standing, sleeping, transfers, bed mobility, dressing, and locomotion level  PARTICIPATION LIMITATIONS: meal prep, cleaning, and laundry  PERSONAL FACTORS: 1 comorbidity: previous lumbar surgery  are also affecting patient's functional outcome.   REHAB POTENTIAL: Good  CLINICAL DECISION MAKING: Stable/uncomplicated  EVALUATION COMPLEXITY: Low   GOALS:  SHORT TERM GOALS: Target date: 02/04/23  Ind with a HEP. Goal status: INITIAL  2.  Eliminate right LE symptoms.  Goal status: INITIAL  3.  Perform ADL's with pain not > 3/10.  Goal status: INITIAL  4.  Sit 30 minutes with pain not > 3/10.  Goal status: INITIAL  5.  Sit 20 minutes with pain not > 3/10.   Goal status: INITIAL  PLAN:  PT FREQUENCY: 2x/week  PT DURATION: 4 weeks  PLANNED INTERVENTIONS: Therapeutic exercises, Therapeutic activity, Patient/Family education, Self Care, Dry Needling, Electrical stimulation, Cryotherapy, Moist heat, Traction, Ultrasound, and  Manual therapy.  PLAN FOR NEXT SESSION: Combo e'stim/US, STW/M, Core exercise progression, spinal protection techniques and body mechanics training.   Linden Tagliaferro, [place], PT 01/07/2023, 12:01 PM

## 2023-01-08 NOTE — Telephone Encounter (Signed)
scheduled

## 2023-01-09 ENCOUNTER — Encounter: Payer: Self-pay | Admitting: *Deleted

## 2023-01-09 ENCOUNTER — Ambulatory Visit: Payer: BC Managed Care – PPO | Admitting: *Deleted

## 2023-01-09 DIAGNOSIS — M62838 Other muscle spasm: Secondary | ICD-10-CM

## 2023-01-09 DIAGNOSIS — M5459 Other low back pain: Secondary | ICD-10-CM | POA: Diagnosis not present

## 2023-01-09 NOTE — Therapy (Signed)
OUTPATIENT PHYSICAL THERAPY THORACOLUMBAR TREATMENT   Patient Name: Maria Velasquez MRN: [id number] DOB:[birth year], 47 y.o., female Today's Date: 01/09/2023  END OF SESSION:  PT End of Session - 01/09/23 1018     Visit Number 2    Number of Visits 8    Date for PT Re-Evaluation 02/04/23    Authorization Type FOTO.    PT Start Time 1015    PT Stop Time 1105    PT Time Calculation (min) 50 min             Past Medical History:  Diagnosis Date   Cancer (HCC)    Vaginal   Headache    otc med prn   Heartburn    History of cervical dysplasia    History of uterine fibroid    Lipoma    Vaginal dysplasia    Past Surgical History:  Procedure Laterality Date   BIOPSY  02/09/2021   Procedure: BIOPSY;  Surgeon: Lanelle Bal, DO;  [facility];  Service: Endoscopy;;   CO2 LASER APPLICATION N/A 10/26/2020   Procedure: CO2 LASER APPLICATION OF VAGINA;  Surgeon: Carver Fila, MD;  Location: [facility];  Service: Gynecology;  Laterality: N/A;   COLONOSCOPY WITH PROPOFOL N/A 02/09/2021   Procedure: COLONOSCOPY WITH PROPOFOL;  Surgeon: Lanelle Bal, DO;  [facility];  Service: Endoscopy;  Laterality: N/A;  10:00am   DILATION AND CURETTAGE OF UTERUS  08/05/2009  @[facility]    missed abortion   ESOPHAGOGASTRODUODENOSCOPY (EGD) WITH PROPOFOL N/A 02/09/2021   Procedure: ESOPHAGOGASTRODUODENOSCOPY (EGD) WITH PROPOFOL;  Surgeon: Lanelle Bal, DO;  [facility];  Service: Endoscopy;  Laterality: N/A;   LAPAROSCOPIC SALPINGO OOPHERECTOMY Right 08-28-2018  @[facility]    ovarian torsion   LAPAROSCOPIC TUBAL LIGATION W/ FILCHIE CLIPS Bilateral 03-20-2010  @[facility]    LUMBAR DISC SURGERY  01-30-2007  @[facility]    L4-- L5   ROBOTIC ASSISTED BILATERAL SALPINGO OOPHERECTOMY Bilateral 11/16/2014   Procedure: ROBOTIC ASSISTED BILATERAL SALPINGECTOMY;  Surgeon: Carrington Clamp, MD;  Location: [facility];  Service: Gynecology;  Laterality: Bilateral;   ROBOTIC  ASSISTED TOTAL HYSTERECTOMY N/A 11/16/2014   w/ BS   VULVA Ples Specter BIOPSY N/A 10/26/2020   Procedure: VAGINAL BIOPSY;  Surgeon: Carver Fila, MD;  Location: [facility];  Service: Gynecology;  Laterality: N/A;   WISDOM TOOTH EXTRACTION  1997   Patient Active Problem List   Diagnosis Date Noted   Radiculopathy, lumbar region, right L5 01/01/2023   Herniated nucleus pulposus, L4-5 right 01/01/2023   Pain of upper abdomen 01/01/2021   Gastroesophageal reflux disease 01/01/2021   Bacterial vaginosis 10/05/2020   Vaginal dysplasia 10/05/2020   REFERRING PROVIDER: Ernestine Mcmurray  REFERRING DIAG: Radiculopathy, lumbar region.  Herniated nucleus pulposus, L4-5, right.  Rationale for Evaluation and Treatment: Rehabilitation  THERAPY DIAG:  Other low back pain  Other muscle spasm  ONSET DATE: March 40981.  SUBJECTIVE:  SUBJECTIVE STATEMENT: Constant pain LB and butt, Sleep on LT side  PERTINENT HISTORY:  Prior left lumbar microdiskectomy.  PAIN:  Are you having pain? Yes: NPRS scale: 6/10 Pain location: Right low back, buttock, lateral thigh to ankle. Pain description: As above. Aggravating factors: Sitting, standing, lying on stomach. Relieving factors: As above.  PRECAUTIONS: None  RED FLAGS: None   WEIGHT BEARING RESTRICTIONS: No  FALLS:  Has patient fallen in last 6 months? 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  Get back to where she was before the onset of pain.  OBJECTIVE:   DIAGNOSTIC FINDINGS:  IMPRESSION:  MRI: 1. Postoperative changes from prior left posterior  decompression with micro discectomy at L4-5 and L5-S1. 2. Right subarticular disc protrusion at L4-5, potentially affecting the descending right L5 nerve root. 3. Small residual and/or recurrent central disc protrusion at L5-S1, closely approximating and potentially affecting either of the descending S1 nerve roots.  PATIENT SURVEYS:  FOTO    POSTURE: No Significant postural limitations  PALPATION: Very tender to palpation over right lower lumbar/SIJ and proximal gluteal region.  LUMBAR ROM:   Functional movement. LOWER EXTREMITY MMT:    Normal bilateral LE strength.  LUMBAR SPECIAL TESTS:  Equal leg lengths. Some pain increase with a right SLR. (-) FABER test.   Normal LE DTR's.  GAIT: Antalgic in nature.  Patient in obvious pain.  TRANSFERS/TRANSITORY MOVEMENTS:   Slow and cautious due to pain.  TODAY'S TREATMENT:                                                                                                                              DATE:                                                                01-09-2023 Discussed postural positions as well as ADL's  and movement patterns to decrease pain triggers   [place] estim combo @ 1.5 w/cm2 x 14 mins RT side LB paras and SIJ with Pt LT sidelying HMP and IFC at 80-150 Hz on 40% scan x 15 minutes to patient's right LB/SIJ and proximal gluteal region in LT sidelying.  Normal modality response following removal of modality.    PATIENT EDUCATION:    HOME EXERCISE PROGRAM:  ASSESSMENT:  CLINICAL IMPRESSION: Pt arrived today in constant LBP. We discussed possible postural positions  as well as ADL modifications to decrease pain triggers. [place] combo was performed as well as manual STW to RT side LB paras and SIJ with good response.IFC and HMP applied to RT LB and  glute end of session. Some decreased pain end of session.     OBJECTIVE IMPAIRMENTS: Abnormal gait, decreased activity tolerance, decreased mobility, increased  muscle spasms, and pain.   ACTIVITY LIMITATIONS: carrying, lifting, bending, sitting, standing, sleeping, transfers, bed mobility, dressing, and locomotion level  PARTICIPATION LIMITATIONS: meal prep, cleaning, and laundry  PERSONAL FACTORS: 1 comorbidity: previous lumbar surgery  are also affecting patient's functional outcome.   REHAB POTENTIAL: Good  CLINICAL DECISION MAKING: Stable/uncomplicated  EVALUATION COMPLEXITY: Low   GOALS:  SHORT TERM GOALS: Target date: 02/04/23  Ind with a HEP. Goal status: INITIAL  2.  Eliminate right LE symptoms.  Goal status: INITIAL  3.  Perform ADL's with pain not > 3/10.  Goal status: INITIAL  4.  Sit 30 minutes with pain not > 3/10.  Goal status: INITIAL  5.  Sit 20 minutes with pain not > 3/10.   Goal status: INITIAL  PLAN:  PT FREQUENCY: 2x/week  PT DURATION: 4 weeks  PLANNED INTERVENTIONS: Therapeutic exercises, Therapeutic activity, Patient/Family education, Self Care, Dry Needling, Electrical stimulation, Cryotherapy, Moist heat, Traction, Ultrasound, and Manual therapy.  PLAN FOR NEXT SESSION: Combo e'stim/US, STW/M, Core exercise progression, spinal protection techniques and body mechanics training.   Mariea Mcmartin,CHRIS, PTA 01/09/2023, 3:51 PM

## 2023-01-20 ENCOUNTER — Ambulatory Visit: Payer: BC Managed Care – PPO | Admitting: *Deleted

## 2023-01-20 DIAGNOSIS — M5459 Other low back pain: Secondary | ICD-10-CM | POA: Diagnosis not present

## 2023-01-20 DIAGNOSIS — M62838 Other muscle spasm: Secondary | ICD-10-CM

## 2023-01-20 NOTE — Therapy (Signed)
OUTPATIENT PHYSICAL THERAPY THORACOLUMBAR TREATMENT   Patient Name: NEGAR PASHIA MRN: [id number] DOB:06-07-1976, 47 y.o., female Today's Date: 01/20/2023  END OF SESSION:  PT End of Session - 01/20/23 1017     Visit Number 3    Number of Visits 8    Date for PT Re-Evaluation 02/04/23    Authorization Type FOTO.    PT Start Time 1015    PT Stop Time 1106    PT Time Calculation (min) 51 min             Past Medical History:  Diagnosis Date   Cancer (HCC)    Vaginal   Headache    otc med prn   Heartburn    History of cervical dysplasia    History of uterine fibroid    Lipoma    Vaginal dysplasia    Past Surgical History:  Procedure Laterality Date   BIOPSY  02/09/2021   Procedure: BIOPSY;  Surgeon: Lanelle Bal, DO;  Location: [facility];  Service: Endoscopy;;   CO2 LASER APPLICATION N/A 10/26/2020   Procedure: CO2 LASER APPLICATION OF VAGINA;  Surgeon: Carver Fila, MD;  Location: [facility];  Service: Gynecology;  Laterality: N/A;   COLONOSCOPY WITH PROPOFOL N/A 02/09/2021   Procedure: COLONOSCOPY WITH PROPOFOL;  Surgeon: Lanelle Bal, DO;  Location: [facility];  Service: Endoscopy;  Laterality: N/A;  10:00am   DILATION AND CURETTAGE OF UTERUS  08/05/2009  @[facility]    missed abortion   ESOPHAGOGASTRODUODENOSCOPY (EGD) WITH PROPOFOL N/A 02/09/2021   Procedure: ESOPHAGOGASTRODUODENOSCOPY (EGD) WITH PROPOFOL;  Surgeon: Lanelle Bal, DO;  Location: [facility];  Service: Endoscopy;  Laterality: N/A;   LAPAROSCOPIC SALPINGO OOPHERECTOMY Right 08-28-2018  @[facility]    ovarian torsion   LAPAROSCOPIC TUBAL LIGATION W/ FILCHIE CLIPS Bilateral 03-20-2010  @[facility]    LUMBAR DISC SURGERY  01-30-2007  @[facility]    L4-- L5   ROBOTIC ASSISTED BILATERAL SALPINGO OOPHERECTOMY Bilateral 11/16/2014   Procedure: ROBOTIC ASSISTED BILATERAL SALPINGECTOMY;  Surgeon: Carrington Clamp, MD;  Location: [facility];  Service: Gynecology;  Laterality: Bilateral;   ROBOTIC  ASSISTED TOTAL HYSTERECTOMY N/A 11/16/2014   w/ BS   VULVA Ples Specter BIOPSY N/A 10/26/2020   Procedure: VAGINAL BIOPSY;  Surgeon: Carver Fila, MD;  Location: [facility];  Service: Gynecology;  Laterality: N/A;   WISDOM TOOTH EXTRACTION  1997   Patient Active Problem List   Diagnosis Date Noted   Radiculopathy, lumbar region, right L5 01/01/2023   Herniated nucleus pulposus, L4-5 right 01/01/2023   Pain of upper abdomen 01/01/2021   Gastroesophageal reflux disease 01/01/2021   Bacterial vaginosis 10/05/2020   Vaginal dysplasia 10/05/2020   REFERRING PROVIDER: Ernestine Mcmurray  REFERRING DIAG: Radiculopathy, lumbar region.  Herniated nucleus pulposus, L4-5, right.  Rationale for Evaluation and Treatment: Rehabilitation  THERAPY DIAG:  Other low back pain  Other muscle spasm  ONSET DATE: March 78295.  SUBJECTIVE:  SUBJECTIVE STATEMENT: Did okay after last Rx, but had some pain the next day while at the beach. Having injection on Wednesday  PERTINENT HISTORY:  Prior left lumbar microdiskectomy.  PAIN:  Are you having pain? Yes: NPRS scale: 6/10 Pain location: Right low back, buttock, lateral thigh to ankle. Pain description: As above. Aggravating factors: Sitting, standing, lying on stomach. Relieving factors: As above.  PRECAUTIONS: None  RED FLAGS: None   WEIGHT BEARING RESTRICTIONS: No  FALLS:  Has patient fallen in last 6 months? 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  Get back to where she was before the onset of pain.  OBJECTIVE:   DIAGNOSTIC FINDINGS:  IMPRESSION:   MRI: 1. Postoperative changes from prior left posterior decompression with micro discectomy at L4-5 and L5-S1. 2. Right subarticular disc protrusion at L4-5, potentially affecting the descending right L5 nerve root. 3. Small residual and/or recurrent central disc protrusion at L5-S1, closely approximating and potentially affecting either of the descending S1 nerve roots.  PATIENT SURVEYS:  FOTO    POSTURE: No Significant postural limitations  PALPATION: Very tender to palpation over right lower lumbar/SIJ and proximal gluteal region.  LUMBAR ROM:   Functional movement. LOWER EXTREMITY MMT:    Normal bilateral LE strength.  LUMBAR SPECIAL TESTS:  Equal leg lengths. Some pain increase with a right SLR. (-) FABER test.   Normal LE DTR's.  GAIT: Antalgic in nature.  Patient in obvious pain.  TRANSFERS/TRANSITORY MOVEMENTS:   Slow and cautious due to pain.  TODAY'S TREATMENT:                                                                                                                              DATE:                                                                01-20-2023    [place] estim combo @ 1.5 w/cm2 x 14 mins RT side LB paras and SIJ with Pt LT sidelying Manual STW to RT side LB paras, SIJ, and into glutes HMP and IFC at 80-150 Hz on 40% scan x 15 minutes to patient's right LB/SIJ and proximal gluteal region in Hooklying.  Normal modality response following removal of modality.    PATIENT EDUCATION:    HOME EXERCISE PROGRAM:  ASSESSMENT:  CLINICAL IMPRESSION: Pt  arrived today doing fairly well and reports having some good days and bad days while at the beach. She is scheduled for injections on Wednesday. [place] combo was performed again as well as manual STW to RT side  LB paras and SIJ with Pt in LT side lying. FC and HMP applied to RT LB and glute end of session in hook lying. Some decreased pain end of session.     OBJECTIVE IMPAIRMENTS: Abnormal gait,  decreased activity tolerance, decreased mobility, increased muscle spasms, and pain.   ACTIVITY LIMITATIONS: carrying, lifting, bending, sitting, standing, sleeping, transfers, bed mobility, dressing, and locomotion level  PARTICIPATION LIMITATIONS: meal prep, cleaning, and laundry  PERSONAL FACTORS: 1 comorbidity: previous lumbar surgery  are also affecting patient's functional outcome.   REHAB POTENTIAL: Good  CLINICAL DECISION MAKING: Stable/uncomplicated  EVALUATION COMPLEXITY: Low   GOALS:  SHORT TERM GOALS: Target date: 02/04/23  Ind with a HEP. Goal status: INITIAL  2.  Eliminate right LE symptoms.  Goal status: INITIAL  3.  Perform ADL's with pain not > 3/10.  Goal status: INITIAL  4.  Sit 30 minutes with pain not > 3/10.  Goal status: INITIAL  5.  Sit 20 minutes with pain not > 3/10.   Goal status: INITIAL  PLAN:  PT FREQUENCY: 2x/week  PT DURATION: 4 weeks  PLANNED INTERVENTIONS: Therapeutic exercises, Therapeutic activity, Patient/Family education, Self Care, Dry Needling, Electrical stimulation, Cryotherapy, Moist heat, Traction, Ultrasound, and Manual therapy.  PLAN FOR NEXT SESSION: Combo e'stim/US, STW/M, Core exercise progression, spinal protection techniques and body mechanics training.   Josphine Laffey,CHRIS, PTA 01/20/2023, 11:15 AM

## 2023-01-22 ENCOUNTER — Other Ambulatory Visit: Payer: Self-pay

## 2023-01-22 ENCOUNTER — Ambulatory Visit (INDEPENDENT_AMBULATORY_CARE_PROVIDER_SITE_OTHER): Payer: BC Managed Care – PPO | Admitting: Physical Medicine and Rehabilitation

## 2023-01-22 VITALS — BP 128/84 | HR 81

## 2023-01-22 DIAGNOSIS — M5416 Radiculopathy, lumbar region: Secondary | ICD-10-CM

## 2023-01-22 MED ORDER — METHYLPREDNISOLONE ACETATE 80 MG/ML IJ SUSP
80.0000 mg | Freq: Once | INTRAMUSCULAR | Status: DC
Start: 2023-01-22 — End: 2023-04-08

## 2023-01-22 NOTE — Progress Notes (Signed)
Functional Pain Scale - descriptive words and definitions  Distracting (5)    Aware of pain/able to complete some ADL's but limited by pain/sleep is affected and active distractions are only slightly useful. Moderate range order  Average Pain 8   +Driver, -BT, -Dye Allergies.  Lower back pain on right side that radiates into the right leg

## 2023-01-22 NOTE — Patient Instructions (Signed)

## 2023-01-23 ENCOUNTER — Encounter: Payer: BC Managed Care – PPO | Admitting: *Deleted

## 2023-01-24 NOTE — Procedures (Signed)
Lumbosacral Transforaminal Epidural Steroid Injection - Sub-Pedicular Approach with Fluoroscopic Guidance  Patient: Maria Velasquez      Date of Birth: 06-Jul-1975 MRN: [id number] PCP: [name], PA-C      Visit Date: 01/22/2023   Universal Protocol:    Date/Time: 01/22/2023  Consent Given By: the patient  Position: PRONE  Additional Comments: Vital signs were monitored before and after the procedure. Patient was prepped and draped in the usual sterile fashion. The correct patient, procedure, and site was verified.   Injection Procedure Details:   Procedure diagnoses: Lumbar radiculopathy [M54.16]    Meds Administered:  Meds ordered this encounter  Medications   methylPREDNISolone acetate (DEPO-MEDROL) injection 80 mg    Laterality: Right  Location/Site: L5  Needle:5.0 in., 22 ga.  Short bevel or Quincke spinal needle  Needle Placement: Transforaminal  Findings:    -Comments: Excellent flow of contrast along the nerve, nerve root and into the epidural space.  Procedure Details: After squaring off the end-plates to get a true AP view, the C-arm was positioned so that an oblique view of the foramen as noted above was visualized. The target area is just inferior to the "nose of the scotty dog" or sub pedicular. The soft tissues overlying this structure were infiltrated with 2-3 ml. of 1% Lidocaine without Epinephrine.  The spinal needle was inserted toward the target using a "trajectory" view along the fluoroscope beam.  Under AP and lateral visualization, the needle was advanced so it did not puncture dura and was located close the 6 O'Clock position of the pedical in AP tracterory. Biplanar projections were used to confirm position. Aspiration was confirmed to be negative for CSF and/or blood. A 1-2 ml. volume of Isovue-250 was injected and flow of contrast was noted at each level. Radiographs were obtained for documentation purposes.   After attaining the desired flow  of contrast documented above, a 0.5 to 1.0 ml test dose of 0.25% Marcaine was injected into each respective transforaminal space.  The patient was observed for 90 seconds post injection.  After no sensory deficits were reported, and normal lower extremity motor function was noted,   the above injectate was administered so that equal amounts of the injectate were placed at each foramen (level) into the transforaminal epidural space.   Additional Comments:  No complications occurred Dressing: 2 x 2 sterile gauze and Band-Aid    Post-procedure details: Patient was observed during the procedure. Post-procedure instructions were reviewed.  Patient left the clinic in stable condition.

## 2023-01-24 NOTE — Progress Notes (Signed)
Maria Velasquez - 47 y.o. female MRN [id number]  Date of birth: Mar 24, 1976  Office Visit Note: Visit Date: 01/22/2023 PCP: [name], PA-C Referred by: London Sheer, MD  Subjective: Chief Complaint  Patient presents with   Lower Back - Pain   HPI:  Maria Velasquez is a 47 y.o. female who comes in today at the request of Dr. Willia Craze for planned Right L5-S1 Lumbar Transforaminal epidural steroid injection with fluoroscopic guidance.  The patient has failed conservative care including home exercise, medications, time and activity modification.  This injection will be diagnostic and hopefully therapeutic.  Please see requesting physician notes for further details and justification.   ROS Otherwise per HPI.  Assessment & Plan: Visit Diagnoses:    ICD-10-CM   1. Lumbar radiculopathy  M54.16 XR C-ARM NO REPORT    Epidural Steroid injection    methylPREDNISolone acetate (DEPO-MEDROL) injection 80 mg      Plan: No additional findings.   Meds & Orders:  Meds ordered this encounter  Medications   methylPREDNISolone acetate (DEPO-MEDROL) injection 80 mg    Orders Placed This Encounter  Procedures   XR C-ARM NO REPORT   Epidural Steroid injection    Follow-up: Return for visit to requesting provider as needed.   Procedures: No procedures performed  Lumbosacral Transforaminal Epidural Steroid Injection - Sub-Pedicular Approach with Fluoroscopic Guidance  Patient: Maria Velasquez      Date of Birth: [birth year] MRN: [id number] PCP: [name], PA-C      Visit Date: 01/22/2023   Universal Protocol:    Date/Time: 01/22/2023  Consent Given By: the patient  Position: PRONE  Additional Comments: Vital signs were monitored before and after the procedure. Patient was prepped and draped in the usual sterile fashion. The correct patient, procedure, and site was verified.   Injection Procedure Details:   Procedure diagnoses: Lumbar radiculopathy [M54.16]     Meds Administered:  Meds ordered this encounter  Medications   methylPREDNISolone acetate (DEPO-MEDROL) injection 80 mg    Laterality: Right  Location/Site: L5  Needle:5.0 in., 22 ga.  Short bevel or Quincke spinal needle  Needle Placement: Transforaminal  Findings:    -Comments: Excellent flow of contrast along the nerve, nerve root and into the epidural space.  Procedure Details: After squaring off the end-plates to get a true AP view, the C-arm was positioned so that an oblique view of the foramen as noted above was visualized. The target area is just inferior to the "nose of the scotty dog" or sub pedicular. The soft tissues overlying this structure were infiltrated with 2-3 ml. of 1% Lidocaine without Epinephrine.  The spinal needle was inserted toward the target using a "trajectory" view along the fluoroscope beam.  Under AP and lateral visualization, the needle was advanced so it did not puncture dura and was located close the 6 O'Clock position of the pedical in AP tracterory. Biplanar projections were used to confirm position. Aspiration was confirmed to be negative for CSF and/or blood. A 1-2 ml. volume of Isovue-250 was injected and flow of contrast was noted at each level. Radiographs were obtained for documentation purposes.   After attaining the desired flow of contrast documented above, a 0.5 to 1.0 ml test dose of 0.25% Marcaine was injected into each respective transforaminal space.  The patient was observed for 90 seconds post injection.  After no sensory deficits were reported, and normal lower extremity motor function was noted,   the above injectate was administered  so that equal amounts of the injectate were placed at each foramen (level) into the transforaminal epidural space.   Additional Comments:  No complications occurred Dressing: 2 x 2 sterile gauze and Band-Aid    Post-procedure details: Patient was observed during the procedure. Post-procedure  instructions were reviewed.  Patient left the clinic in stable condition.    Clinical History: MRI LUMBAR SPINE WITHOUT CONTRAST   TECHNIQUE: Multiplanar, multisequence MR imaging of the lumbar spine was performed. No intravenous contrast was administered.   COMPARISON:  Prior radiograph from 10/31/2022 as well as MRI from 12/03/2008.   FINDINGS: Segmentation: Standard. Lowest well-formed disc space labeled the L5-S1 level.   Alignment: Physiologic with preservation of the normal lumbar lordosis. No listhesis.   Vertebrae: Vertebral body height maintained without acute or chronic fracture. Bone marrow signal intensity within normal limits. Few scattered benign hemangiomata noted. No worrisome osseous lesions. Degenerative reactive endplate changes present about the L5-S1 interspace. No abnormal marrow edema.   Conus medullaris and cauda equina: Conus extends to the L1 level. Conus and cauda equina appear normal.   Paraspinal and other soft tissues: Chronic postoperative scarring present within the lower posterior paraspinous soft tissues. No acute finding. Visualized visceral structures within normal limits.   Disc levels:   L1-2:  Unremarkable.   L2-3:  Unremarkable.   L3-4: Normal interspace. Mild bilateral facet hypertrophy. No spinal stenosis. Foramina remain patent.   L4-5: Degenerative intervertebral disc space narrowing with disc desiccation. Postoperative changes from prior left-sided laminectomy with micro discectomy. There is a superimposed right subarticular disc protrusion with annular fissure at this level (series 8, image 24). Protruding disc closely approximates the descending right L5 nerve root. Superimposed bilateral facet hypertrophy. Resultant mild to moderate right lateral recess stenosis. Central canal remains patent. No significant foraminal stenosis.   L5-S1: Degenerative vertebral disc space narrowing with disc desiccation and diffuse  disc bulge. Associated reactive endplate spurring. Postoperative changes from prior left hemi laminectomy with micro discectomy. Superimposed small central disc protrusion closely approximates and/or contacts the descending S1 nerve roots (series 9, image 29). Mild facet hypertrophy. Residual mild narrowing of the left lateral recess. Central canal remains patent. No significant foraminal stenosis.   IMPRESSION: 1. Postoperative changes from prior left posterior decompression with micro discectomy at L4-5 and L5-S1. 2. Right subarticular disc protrusion at L4-5, potentially affecting the descending right L5 nerve root. 3. Small residual and/or recurrent central disc protrusion at L5-S1, closely approximating and potentially affecting either of the descending S1 nerve roots.     Electronically Signed   By: Rise Mu M.D.   On: 11/15/2022 17:00     Objective:  VS:  HT:    WT:   BMI:     BP:128/84  HR:81bpm  TEMP: ( )  RESP:  Physical Exam Vitals and nursing note reviewed.  Constitutional:      General: She is not in acute distress.    Appearance: Normal appearance. She is not ill-appearing.  HENT:     Head: Normocephalic and atraumatic.     Right Ear: External ear normal.     Left Ear: External ear normal.  Eyes:     Extraocular Movements: Extraocular movements intact.  Cardiovascular:     Rate and Rhythm: Normal rate.     Pulses: Normal pulses.  Pulmonary:     Effort: Pulmonary effort is normal. No respiratory distress.  Abdominal:     General: There is no distension.     Palpations: Abdomen is  soft.  Musculoskeletal:        General: Tenderness present.     Cervical back: Neck supple.     Right lower leg: No edema.     Left lower leg: No edema.     Comments: Patient has good distal strength with no pain over the greater trochanters.  No clonus or focal weakness.  Skin:    Findings: No erythema, lesion or rash.  Neurological:     General: No focal  deficit present.     Mental Status: She is alert and oriented to person, place, and time.     Sensory: No sensory deficit.     Motor: No weakness or abnormal muscle tone.     Coordination: Coordination normal.  Psychiatric:        Mood and Affect: Mood normal.        Behavior: Behavior normal.      Imaging: No results found.

## 2023-01-27 ENCOUNTER — Encounter: Payer: BC Managed Care – PPO | Admitting: Physical Therapy

## 2023-01-28 ENCOUNTER — Ambulatory Visit: Payer: BC Managed Care – PPO | Attending: Neurosurgery | Admitting: Physical Therapy

## 2023-01-28 DIAGNOSIS — M5459 Other low back pain: Secondary | ICD-10-CM | POA: Diagnosis present

## 2023-01-28 DIAGNOSIS — M62838 Other muscle spasm: Secondary | ICD-10-CM | POA: Insufficient documentation

## 2023-01-28 NOTE — Therapy (Signed)
OUTPATIENT PHYSICAL THERAPY THORACOLUMBAR TREATMENT   Patient Name: Maria Velasquez MRN: [id number] DOB:29-May-1976, 47 y.o., female Today's Date: 01/28/2023  END OF SESSION:  PT End of Session - 01/28/23 1502     Visit Number 4    Number of Visits 8    Date for PT Re-Evaluation 02/04/23    Authorization Type FOTO.    PT Start Time 0145    PT Stop Time 0230    PT Time Calculation (min) 45 min    Activity Tolerance Patient tolerated treatment well    Behavior During Therapy WFL for tasks assessed/performed              Past Medical History:  Diagnosis Date   Cancer (HCC)    Vaginal   Headache    otc med prn   Heartburn    History of cervical dysplasia    History of uterine fibroid    Lipoma    Vaginal dysplasia    Past Surgical History:  Procedure Laterality Date   BIOPSY  02/09/2021   Procedure: BIOPSY;  Surgeon: Lanelle Bal, DO;  [facility];  Service: Endoscopy;;   CO2 LASER APPLICATION N/A 10/26/2020   Procedure: CO2 LASER APPLICATION OF VAGINA;  Surgeon: Carver Fila, MD;  Location: [facility];  Service: Gynecology;  Laterality: N/A;   COLONOSCOPY WITH PROPOFOL N/A 02/09/2021   Procedure: COLONOSCOPY WITH PROPOFOL;  Surgeon: Lanelle Bal, DO;  [facility];  Service: Endoscopy;  Laterality: N/A;  10:00am   DILATION AND CURETTAGE OF UTERUS  08/05/2009  @[facility]    missed abortion   ESOPHAGOGASTRODUODENOSCOPY (EGD) WITH PROPOFOL N/A 02/09/2021   Procedure: ESOPHAGOGASTRODUODENOSCOPY (EGD) WITH PROPOFOL;  Surgeon: Lanelle Bal, DO;  [facility];  Service: Endoscopy;  Laterality: N/A;   LAPAROSCOPIC SALPINGO OOPHERECTOMY Right 08-28-2018  @[facility]    ovarian torsion   LAPAROSCOPIC TUBAL LIGATION W/ FILCHIE CLIPS Bilateral 03-20-2010  @[facility]    LUMBAR DISC SURGERY  01-30-2007  @[facility]    L4-- L5   ROBOTIC ASSISTED BILATERAL SALPINGO OOPHERECTOMY Bilateral 11/16/2014   Procedure: ROBOTIC ASSISTED BILATERAL  SALPINGECTOMY;  Surgeon: Carrington Clamp, MD;  Location: [facility];  Service: Gynecology;  Laterality: Bilateral;   ROBOTIC ASSISTED TOTAL HYSTERECTOMY N/A 11/16/2014   w/ BS   VULVA Ples Specter BIOPSY N/A 10/26/2020   Procedure: VAGINAL BIOPSY;  Surgeon: Carver Fila, MD;  Location: [facility];  Service: Gynecology;  Laterality: N/A;   WISDOM TOOTH EXTRACTION  1997   Patient Active Problem List   Diagnosis Date Noted   Radiculopathy, lumbar region, right L5 01/01/2023   Herniated nucleus pulposus, L4-5 right 01/01/2023   Pain of upper abdomen 01/01/2021   Gastroesophageal reflux disease 01/01/2021   Bacterial vaginosis 10/05/2020   Vaginal dysplasia 10/05/2020   REFERRING PROVIDER: Ernestine Mcmurray  REFERRING DIAG: Radiculopathy, lumbar region.  Herniated nucleus pulposus, L4-5, right.  Rationale for Evaluation and Treatment: Rehabilitation  THERAPY DIAG:  Other low back pain  Other muscle spasm  ONSET DATE: March 78295.  SUBJECTIVE:  SUBJECTIVE STATEMENT: Injection really wasn't helpful.  PERTINENT HISTORY:  Prior left lumbar microdiskectomy.  PAIN:  Are you having pain? Yes: NPRS scale: 6/10 Pain location: Right low back, buttock, lateral thigh to ankle. Pain description: As above. Aggravating factors: Sitting, standing, lying on stomach. Relieving factors: As above.  PRECAUTIONS: None  RED FLAGS: None   WEIGHT BEARING RESTRICTIONS: No  FALLS:  Has patient fallen in last 6 months? 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  Get back to where she was before the onset of  pain.  OBJECTIVE:   DIAGNOSTIC FINDINGS:  IMPRESSION:  MRI: 1. Postoperative changes from prior left posterior decompression with micro discectomy at L4-5 and L5-S1. 2. Right subarticular disc protrusion at L4-5, potentially affecting the descending right L5 nerve root. 3. Small residual and/or recurrent central disc protrusion at L5-S1, closely approximating and potentially affecting either of the descending S1 nerve roots.  PATIENT SURVEYS:  FOTO    POSTURE: No Significant postural limitations  PALPATION: Very tender to palpation over right lower lumbar/SIJ and proximal gluteal region.  LUMBAR ROM:   Functional movement. LOWER EXTREMITY MMT:    Normal bilateral LE strength.  LUMBAR SPECIAL TESTS:  Equal leg lengths. Some pain increase with a right SLR. (-) FABER test.   Normal LE DTR's.  GAIT: Antalgic in nature.  Patient in obvious pain.  TRANSFERS/TRANSITORY MOVEMENTS:   Slow and cautious due to pain.  TODAY'S TREATMENT:                                                                                                                              DATE:                                                                01/28/2023   Nustep level 4 x 10 minutes f/b S and DKTC (long holds) and hip bridges (5 minutes total) f/b gentle right femoral traction, sustained and oscillations f/b HMP and IFC at 80-150 Hz on 40% scan x 15 minutes.  Normal modality response following removal of modality.  PATIENT EDUCATION:    HOME EXERCISE PROGRAM:  HOME EXERCISE PROGRAM Created by [place] Howard Patton Aug 6th, 2024 View at [email or web address] using code: [email or web address]  Page 1 of 1 3 Exercises Bridges While laying on your back, contract the low abdominals and lift from the hips. Repeat 15 Times Hold 2 Seconds Complete 2 Sets Perform 2 Times a Day SINGLE KNEE TO CHEST STRETCH - SKTC While Lying on your back, hold your knee and gently pull it up towards your chest. Repeat  3 Times  Hold 30 Seconds Complete 1 Set Perform 3 Times a Day Double Knee to chest stretch Pull your knees up to your chest hold for 30 seconds then relax. Repeat Continue to breathe throughout stretch. Repeat 2 Times Hold 30 Seconds Complete 3 Sets Perform 2 Times a Day  ASSESSMENT:  CLINICAL IMPRESSION: Patient with continued LB/right SIJ pain.  She did well, today, with the introduction of Nustep, S and DKTC stretch, hip bridges and gentle right femoral traction (5 minutes).  Following treatment she stating feeling better.      OBJECTIVE IMPAIRMENTS: Abnormal gait, decreased activity tolerance, decreased mobility, increased muscle spasms, and pain.   ACTIVITY LIMITATIONS: carrying, lifting, bending, sitting, standing, sleeping, transfers, bed mobility, dressing, and locomotion level  PARTICIPATION LIMITATIONS: meal prep, cleaning, and laundry  PERSONAL FACTORS: 1 comorbidity: previous lumbar surgery  are also affecting patient's functional outcome.   REHAB POTENTIAL: Good  CLINICAL DECISION MAKING: Stable/uncomplicated  EVALUATION COMPLEXITY: Low   GOALS:  SHORT TERM GOALS: Target date: 02/04/23  Ind with a HEP. Goal status: INITIAL  2.  Eliminate right LE symptoms.  Goal status: INITIAL  3.  Perform ADL's with pain not > 3/10.  Goal status: INITIAL  4.  Sit 30 minutes with pain not > 3/10.  Goal status: INITIAL  5.  Sit 20 minutes with pain not > 3/10.   Goal status: INITIAL  PLAN:  PT FREQUENCY: 2x/week  PT DURATION: 4 weeks  PLANNED INTERVENTIONS: Therapeutic exercises, Therapeutic activity, Patient/Family education, Self Care, Dry Needling, Electrical stimulation, Cryotherapy, Moist heat, Traction, Ultrasound, and Manual therapy.  PLAN FOR NEXT SESSION: Elliptical    Livie Vanderhoof, [place], PT 01/28/2023, 3:08 PM

## 2023-01-29 ENCOUNTER — Encounter: Payer: Self-pay | Admitting: Physical Therapy

## 2023-01-29 ENCOUNTER — Ambulatory Visit: Payer: BC Managed Care – PPO | Admitting: Physical Therapy

## 2023-01-29 DIAGNOSIS — M5459 Other low back pain: Secondary | ICD-10-CM | POA: Diagnosis not present

## 2023-01-29 DIAGNOSIS — M62838 Other muscle spasm: Secondary | ICD-10-CM

## 2023-01-29 NOTE — Therapy (Addendum)
OUTPATIENT PHYSICAL THERAPY THORACOLUMBAR TREATMENT   Patient Name: Maria Velasquez MRN: [id number] DOB:July 04, 1975, 47 y.o., female Today's Date: 01/29/2023  END OF SESSION:  PT End of Session - 01/29/23 1212     Visit Number 5    Number of Visits 8    Date for PT Re-Evaluation 02/04/23    Authorization Type FOTO.    PT Start Time 1104    PT Stop Time 1156    PT Time Calculation (min) 52 min    Activity Tolerance Patient tolerated treatment well    Behavior During Therapy WFL for tasks assessed/performed              Past Medical History:  Diagnosis Date   Cancer (HCC)    Vaginal   Headache    otc med prn   Heartburn    History of cervical dysplasia    History of uterine fibroid    Lipoma    Vaginal dysplasia    Past Surgical History:  Procedure Laterality Date   BIOPSY  02/09/2021   Procedure: BIOPSY;  Surgeon: Lanelle Bal, DO;  [facility];  Service: Endoscopy;;   CO2 LASER APPLICATION N/A 10/26/2020   Procedure: CO2 LASER APPLICATION OF VAGINA;  Surgeon: Carver Fila, MD;  Location: [facility];  Service: Gynecology;  Laterality: N/A;   COLONOSCOPY WITH PROPOFOL N/A 02/09/2021   Procedure: COLONOSCOPY WITH PROPOFOL;  Surgeon: Lanelle Bal, DO;  [facility];  Service: Endoscopy;  Laterality: N/A;  10:00am   DILATION AND CURETTAGE OF UTERUS  08/05/2009  @[facility]    missed abortion   ESOPHAGOGASTRODUODENOSCOPY (EGD) WITH PROPOFOL N/A 02/09/2021   Procedure: ESOPHAGOGASTRODUODENOSCOPY (EGD) WITH PROPOFOL;  Surgeon: Lanelle Bal, DO;  [facility];  Service: Endoscopy;  Laterality: N/A;   LAPAROSCOPIC SALPINGO OOPHERECTOMY Right 08-28-2018  @[facility]    ovarian torsion   LAPAROSCOPIC TUBAL LIGATION W/ FILCHIE CLIPS Bilateral 03-20-2010  @[facility]    LUMBAR DISC SURGERY  01-30-2007  @[facility]    L4-- L5   ROBOTIC ASSISTED BILATERAL SALPINGO OOPHERECTOMY Bilateral 11/16/2014   Procedure: ROBOTIC ASSISTED BILATERAL  SALPINGECTOMY;  Surgeon: Carrington Clamp, MD;  Location: [facility];  Service: Gynecology;  Laterality: Bilateral;   ROBOTIC ASSISTED TOTAL HYSTERECTOMY N/A 11/16/2014   w/ BS   VULVA Ples Specter BIOPSY N/A 10/26/2020   Procedure: VAGINAL BIOPSY;  Surgeon: Carver Fila, MD;  Location: [facility];  Service: Gynecology;  Laterality: N/A;   WISDOM TOOTH EXTRACTION  1997   Patient Active Problem List   Diagnosis Date Noted   Radiculopathy, lumbar region, right L5 01/01/2023   Herniated nucleus pulposus, L4-5 right 01/01/2023   Pain of upper abdomen 01/01/2021   Gastroesophageal reflux disease 01/01/2021   Bacterial vaginosis 10/05/2020   Vaginal dysplasia 10/05/2020   REFERRING PROVIDER: Ernestine Mcmurray  REFERRING DIAG: Radiculopathy, lumbar region.  Herniated nucleus pulposus, L4-5, right.  Rationale for Evaluation and Treatment: Rehabilitation  THERAPY DIAG:  Other low back pain  Other muscle spasm  ONSET DATE: March 40981.  SUBJECTIVE:  SUBJECTIVE STATEMENT: Last treatment was very helpful with pain low today.  Requesting technique performed yesterday on right as well.  PERTINENT HISTORY:  Prior left lumbar microdiskectomy.  PAIN:  Are you having pain? Yes: NPRS scale: Low/good/10 Pain location: Right low back, buttock, lateral thigh to ankle. Pain description: As above. Aggravating factors: Sitting, standing, lying on stomach. Relieving factors: As above.  PRECAUTIONS: None  RED FLAGS: None   WEIGHT BEARING RESTRICTIONS: No  FALLS:  Has patient fallen in last 6 months? 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  Get back to where she was before the onset of pain.  OBJECTIVE:   DIAGNOSTIC FINDINGS:  IMPRESSION:  MRI: 1. Postoperative changes from prior left posterior decompression with micro discectomy at L4-5 and L5-S1. 2. Right subarticular disc protrusion at L4-5, potentially affecting the descending right L5 nerve root. 3. Small residual and/or recurrent central disc protrusion at L5-S1, closely approximating and potentially affecting either of the descending S1 nerve roots.  PATIENT SURVEYS:  FOTO    POSTURE: No Significant postural limitations  PALPATION: Very tender to palpation over right lower lumbar/SIJ and proximal gluteal region.  LUMBAR ROM:   Functional movement. LOWER EXTREMITY MMT:    Normal bilateral LE strength.  LUMBAR SPECIAL TESTS:  Equal leg lengths. Some pain increase with a right SLR. (-) FABER test.   Normal LE DTR's.  GAIT: Antalgic in nature.  Patient in obvious pain.  TRANSFERS/TRANSITORY MOVEMENTS:   Slow and cautious due to pain.  TODAY'S TREATMENT:                                                                                                                              DATE:                                                                01/29/2023   Elliptical L3/3 x minutes 8 minutes (4 minutes forward and 4 minutes backward) f/b  f/b gentle bilateral femoral traction, sustained and oscillations (8 minutes each side) f/b HMP and IFC at 80-150 Hz on 40% scan x 20 minutes.  Normal modality response following removal of modality.  PATIENT EDUCATION:    HOME EXERCISE PROGRAM:  HOME EXERCISE PROGRAM Created by [place] Rulon Abdalla Aug 6th, 2024 View at [email or web address] using code: [email or web address]  Page 1 of 1 3 Exercises Bridges While laying on your back, contract the low abdominals and lift from the hips. Repeat 15 Times Hold 2 Seconds Complete 2 Sets Perform 2 Times a Day SINGLE KNEE TO CHEST  STRETCH - SKTC While  Lying on your back, hold your knee and gently pull it up towards your chest. Repeat 3 Times Hold 30 Seconds Complete 1 Set Perform 3 Times a Day Double Knee to chest stretch Pull your knees up to your chest hold for 30 seconds then relax. Repeat Continue to breathe throughout stretch. Repeat 2 Times Hold 30 Seconds Complete 3 Sets Perform 2 Times a Day  ASSESSMENT:  CLINICAL IMPRESSION: Excellent response to last treatment.  She did very well on elliptical with erect posture.  Recommend she perform at gym she is a member of.  She did well with bilateral gentle femoral traction today and felt good following treatment.      OBJECTIVE IMPAIRMENTS: Abnormal gait, decreased activity tolerance, decreased mobility, increased muscle spasms, and pain.   ACTIVITY LIMITATIONS: carrying, lifting, bending, sitting, standing, sleeping, transfers, bed mobility, dressing, and locomotion level  PARTICIPATION LIMITATIONS: meal prep, cleaning, and laundry  PERSONAL FACTORS: 1 comorbidity: previous lumbar surgery  are also affecting patient's functional outcome.   REHAB POTENTIAL: Good  CLINICAL DECISION MAKING: Stable/uncomplicated  EVALUATION COMPLEXITY: Low   GOALS:  SHORT TERM GOALS: Target date: 02/04/23  Ind with a HEP. Goal status: INITIAL  2.  Eliminate right LE symptoms.  Goal status: INITIAL  3.  Perform ADL's with pain not > 3/10.  Goal status: INITIAL  4.  Sit 30 minutes with pain not > 3/10.  Goal status: INITIAL  5.  Sit 20 minutes with pain not > 3/10.   Goal status: INITIAL  PLAN:  PT FREQUENCY: 2x/week  PT DURATION: 4 weeks  PLANNED INTERVENTIONS: Therapeutic exercises, Therapeutic activity, Patient/Family education, Self Care, Dry Needling, Electrical stimulation, Cryotherapy, Moist heat, Traction, Ultrasound, and Manual therapy.  PLAN FOR NEXT SESSION: Elliptical    Alexandra Posadas, [place], PT 01/29/2023, 12:18 PM

## 2023-02-03 ENCOUNTER — Ambulatory Visit: Payer: BC Managed Care – PPO

## 2023-02-03 DIAGNOSIS — M5459 Other low back pain: Secondary | ICD-10-CM | POA: Diagnosis not present

## 2023-02-03 DIAGNOSIS — M62838 Other muscle spasm: Secondary | ICD-10-CM

## 2023-02-03 NOTE — Therapy (Signed)
OUTPATIENT PHYSICAL THERAPY THORACOLUMBAR TREATMENT   Patient Name: Maria Velasquez MRN: [id number] DOB:1976-06-19, 47 y.o., female Today's Date: 02/03/2023  END OF SESSION:  PT End of Session - 02/03/23 1107     Visit Number 6    Number of Visits 8    Date for PT Re-Evaluation 02/04/23    Authorization Type FOTO.    PT Start Time 1100    PT Stop Time 1154    PT Time Calculation (min) 54 min    Activity Tolerance Patient tolerated treatment well    Behavior During Therapy WFL for tasks assessed/performed              Past Medical History:  Diagnosis Date   Cancer (HCC)    Vaginal   Headache    otc med prn   Heartburn    History of cervical dysplasia    History of uterine fibroid    Lipoma    Vaginal dysplasia    Past Surgical History:  Procedure Laterality Date   BIOPSY  02/09/2021   Procedure: BIOPSY;  Surgeon: Lanelle Bal, DO;  [facility];  Service: Endoscopy;;   CO2 LASER APPLICATION N/A 10/26/2020   Procedure: CO2 LASER APPLICATION OF VAGINA;  Surgeon: Carver Fila, MD;  [facility]  Service: Gynecology;  Laterality: N/A;   COLONOSCOPY WITH PROPOFOL N/A 02/09/2021   Procedure: COLONOSCOPY WITH PROPOFOL;  Surgeon: Lanelle Bal, DO;  [facility];  Service: Endoscopy;  Laterality: N/A;  10:00am   DILATION AND CURETTAGE OF UTERUS  08/05/2009  @[facility]    missed abortion   ESOPHAGOGASTRODUODENOSCOPY (EGD) WITH PROPOFOL N/A 02/09/2021   Procedure: ESOPHAGOGASTRODUODENOSCOPY (EGD) WITH PROPOFOL;  Surgeon: Lanelle Bal, DO;  [facility];  Service: Endoscopy;  Laterality: N/A;   LAPAROSCOPIC SALPINGO OOPHERECTOMY Right 08-28-2018  @[facility]    ovarian torsion   LAPAROSCOPIC TUBAL LIGATION W/ FILCHIE CLIPS Bilateral 03-20-2010  @[facility]    LUMBAR DISC SURGERY  01-30-2007  @[facility]    L4-- L5   ROBOTIC ASSISTED BILATERAL SALPINGO OOPHERECTOMY Bilateral 11/16/2014   Procedure: ROBOTIC ASSISTED BILATERAL  SALPINGECTOMY;  Surgeon: Carrington Clamp, MD;  Location: [facility];  Service: Gynecology;  Laterality: Bilateral;   ROBOTIC ASSISTED TOTAL HYSTERECTOMY N/A 11/16/2014   w/ BS   VULVA Ples Specter BIOPSY N/A 10/26/2020   Procedure: VAGINAL BIOPSY;  Surgeon: Carver Fila, MD;  Location[facility];  Service: Gynecology;  Laterality: N/A;   WISDOM TOOTH EXTRACTION  1997   Patient Active Problem List   Diagnosis Date Noted   Radiculopathy, lumbar region, right L5 01/01/2023   Herniated nucleus pulposus, L4-5 right 01/01/2023   Pain of upper abdomen 01/01/2021   Gastroesophageal reflux disease 01/01/2021   Bacterial vaginosis 10/05/2020   Vaginal dysplasia 10/05/2020   REFERRING PROVIDER: Ernestine Mcmurray  REFERRING DIAG: Radiculopathy, lumbar region.  Herniated nucleus pulposus, L4-5, right.  Rationale for Evaluation and Treatment: Rehabilitation  THERAPY DIAG:  Other low back pain  Other muscle spasm  ONSET DATE: March 19147.  SUBJECTIVE:  SUBJECTIVE STATEMENT: Pt denies any pain today.   PERTINENT HISTORY:  Prior left lumbar microdiskectomy.  PAIN:  Are you having pain? No  PRECAUTIONS: None  RED FLAGS: None   WEIGHT BEARING RESTRICTIONS: No  FALLS:  Has patient fallen in last 6 months? 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  Get back to where she was before the onset of pain.  OBJECTIVE:   DIAGNOSTIC FINDINGS:  IMPRESSION:  MRI: 1. Postoperative changes from prior left posterior decompression with micro discectomy at L4-5 and L5-S1. 2. Right subarticular disc protrusion at L4-5,  potentially affecting the descending right L5 nerve root. 3. Small residual and/or recurrent central disc protrusion at L5-S1, closely approximating and potentially affecting either of the descending S1 nerve roots.  PATIENT SURVEYS:  FOTO    POSTURE: No Significant postural limitations  PALPATION: Very tender to palpation over right lower lumbar/SIJ and proximal gluteal region.  LUMBAR ROM:   Functional movement. LOWER EXTREMITY MMT:    Normal bilateral LE strength.  LUMBAR SPECIAL TESTS:  Equal leg lengths. Some pain increase with a right SLR. (-) FABER test.   Normal LE DTR's.  GAIT: Antalgic in nature.  Patient in obvious pain.  TRANSFERS/TRANSITORY MOVEMENTS:   Slow and cautious due to pain.  TODAY'S TREATMENT:                                                                                                                              DATE: 02/03/2023                                     EXERCISE LOG  Exercise Repetitions and Resistance Comments  Elliptical  L3/L3 x 10 mins (5 mins forward/5 mins backward)   Cybex Knee Flexion 30# x 3 mins   Cybex Knee Extension 10# x 3 mins   Ball Roll Out 3 mins   [employer] 3 mins    Blank cell = exercise not performed today  Modalities  Date:  Unattended Estim: Lumbar, IFC 80-150 Hz, 15 mins, Pain Hot Pack: Lumbar, 15 mins, Pain and Tone   PATIENT EDUCATION:    HOME EXERCISE PROGRAM:  HOME EXERCISE PROGRAM Created by [place] Applegate Aug 6th, 2024 View at [email or web address] using code: [email or web address]  Page 1 of 1 3 Exercises Bridges While laying on your back, contract the low abdominals and lift from the hips. Repeat 15 Times Hold 2 Seconds Complete 2 Sets Perform 2 Times a Day SINGLE KNEE TO CHEST STRETCH - SKTC While Lying on your back, hold your knee and gently pull it up towards your chest. Repeat 3 Times Hold 30 Seconds Complete 1 Set Perform 3 Times a Day Double Knee to chest stretch  Pull your knees up  to your chest hold for 30 seconds then relax. Repeat Continue to breathe throughout stretch. Repeat 2 Times Hold 30 Seconds Complete 3 Sets Perform 2 Times a Day  ASSESSMENT:  CLINICAL IMPRESSION:  Pt arrives for today's treatment session denying any pain.  Pt reports being able to use the elliptical at the gym without issue or discomfort.  Pt able to tolerate increased time on elliptical today without complaint.  Pt introduced to cybex knee flexion and extension today with min cues required for eccentric control.  Pt instructed in physioball exercises today with min cues required for proper technique and posture.  Pt educated on other ways to utilize physioball at home or at work to strengthen core and lower back.  Pt interested in purchasing one for personal use.  Normal responses to estim and MH noted upon removal.  Pt denied any pain at completion of today's treatment session.    OBJECTIVE IMPAIRMENTS: Abnormal gait, decreased activity tolerance, decreased mobility, increased muscle spasms, and pain.   ACTIVITY LIMITATIONS: carrying, lifting, bending, sitting, standing, sleeping, transfers, bed mobility, dressing, and locomotion level  PARTICIPATION LIMITATIONS: meal prep, cleaning, and laundry  PERSONAL FACTORS: 1 comorbidity: previous lumbar surgery  are also affecting patient's functional outcome.   REHAB POTENTIAL: Good  CLINICAL DECISION MAKING: Stable/uncomplicated  EVALUATION COMPLEXITY: Low   GOALS:  SHORT TERM GOALS: Target date: 02/04/23  Ind with a HEP. Goal status: INITIAL  2.  Eliminate right LE symptoms.  Goal status: INITIAL  3.  Perform ADL's with pain not > 3/10.  Goal status: INITIAL  4.  Sit 30 minutes with pain not > 3/10.  Goal status: INITIAL  5.  Sit 20 minutes with pain not > 3/10.   Goal status: INITIAL  PLAN:  PT FREQUENCY: 2x/week  PT DURATION: 4 weeks  PLANNED INTERVENTIONS: Therapeutic exercises, Therapeutic activity, Patient/Family  education, Self Care, Dry Needling, Electrical stimulation, Cryotherapy, Moist heat, Traction, Ultrasound, and Manual therapy.  PLAN FOR NEXT SESSION: Elliptical    Newman Pies, PTA 02/03/2023, 11:55 AM

## 2023-02-05 ENCOUNTER — Encounter: Payer: BC Managed Care – PPO | Admitting: Physical Therapy

## 2023-02-06 ENCOUNTER — Ambulatory Visit: Payer: BC Managed Care – PPO | Admitting: *Deleted

## 2023-02-11 ENCOUNTER — Ambulatory Visit: Payer: BC Managed Care – PPO

## 2023-02-11 DIAGNOSIS — M5459 Other low back pain: Secondary | ICD-10-CM

## 2023-02-11 DIAGNOSIS — M62838 Other muscle spasm: Secondary | ICD-10-CM

## 2023-02-11 NOTE — Therapy (Signed)
OUTPATIENT PHYSICAL THERAPY THORACOLUMBAR TREATMENT   Patient Name: Maria Velasquez MRN: [id number] DOB:July 22, 1975, 47 y.o., female Today's Date: 02/11/2023  END OF SESSION:  PT End of Session - 02/11/23 1305     Visit Number 7    Number of Visits 8    Date for PT Re-Evaluation 02/04/23    Authorization Type FOTO.    PT Start Time 1300    PT Stop Time 1400    PT Time Calculation (min) 60 min    Activity Tolerance Patient tolerated treatment well    Behavior During Therapy WFL for tasks assessed/performed              Past Medical History:  Diagnosis Date   Cancer (HCC)    Vaginal   Headache    otc med prn   Heartburn    History of cervical dysplasia    History of uterine fibroid    Lipoma    Vaginal dysplasia    Past Surgical History:  Procedure Laterality Date   BIOPSY  02/09/2021   Procedure: BIOPSY;  Surgeon: Lanelle Bal, DO;  [facility];  Service: Endoscopy;;   CO2 LASER APPLICATION N/A 10/26/2020   Procedure: CO2 LASER APPLICATION OF VAGINA;  Surgeon: Carver Fila, MD;  Location: [facility];  Service: Gynecology;  Laterality: N/A;   COLONOSCOPY WITH PROPOFOL N/A 02/09/2021   Procedure: COLONOSCOPY WITH PROPOFOL;  Surgeon: Lanelle Bal, DO;  [facility];  Service: Endoscopy;  Laterality: N/A;  10:00am   DILATION AND CURETTAGE OF UTERUS  08/05/2009  @[facility]    missed abortion   ESOPHAGOGASTRODUODENOSCOPY (EGD) WITH PROPOFOL N/A 02/09/2021   Procedure: ESOPHAGOGASTRODUODENOSCOPY (EGD) WITH PROPOFOL;  Surgeon: Lanelle Bal, DO;  [facility];  Service: Endoscopy;  Laterality: N/A;   LAPAROSCOPIC SALPINGO OOPHERECTOMY Right 08-28-2018  @[facility]    ovarian torsion   LAPAROSCOPIC TUBAL LIGATION W/ FILCHIE CLIPS Bilateral 03-20-2010  @[facility]    LUMBAR DISC SURGERY  01-30-2007  @[facility]    L4-- L5   ROBOTIC ASSISTED BILATERAL SALPINGO OOPHERECTOMY Bilateral 11/16/2014   Procedure: ROBOTIC ASSISTED BILATERAL  SALPINGECTOMY;  Surgeon: Carrington Clamp, MD;  Location: [facility];  Service: Gynecology;  Laterality: Bilateral;   ROBOTIC ASSISTED TOTAL HYSTERECTOMY N/A 11/16/2014   w/ BS   VULVA Ples Specter BIOPSY N/A 10/26/2020   Procedure: VAGINAL BIOPSY;  Surgeon: Carver Fila, MD;  Location: [facility];  Service: Gynecology;  Laterality: N/A;   WISDOM TOOTH EXTRACTION  1997   Patient Active Problem List   Diagnosis Date Noted   Radiculopathy, lumbar region, right L5 01/01/2023   Herniated nucleus pulposus, L4-5 right 01/01/2023   Pain of upper abdomen 01/01/2021   Gastroesophageal reflux disease 01/01/2021   Bacterial vaginosis 10/05/2020   Vaginal dysplasia 10/05/2020   REFERRING PROVIDER: Ernestine Mcmurray  REFERRING DIAG: Radiculopathy, lumbar region.  Herniated nucleus pulposus, L4-5, right.  Rationale for Evaluation and Treatment: Rehabilitation  THERAPY DIAG:  Other low back pain  Other muscle spasm  ONSET DATE: March 78295.  SUBJECTIVE:  SUBJECTIVE STATEMENT: Pt denies any pain today.   PERTINENT HISTORY:  Prior left lumbar microdiskectomy.  PAIN:  Are you having pain? No  PRECAUTIONS: None  RED FLAGS: None   WEIGHT BEARING RESTRICTIONS: No  FALLS:  Has patient fallen in last 6 months? 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  Get back to where she was before the onset of pain.  OBJECTIVE:   DIAGNOSTIC FINDINGS:  IMPRESSION:  MRI: 1. Postoperative changes from prior left posterior decompression with micro discectomy at L4-5 and L5-S1. 2. Right subarticular disc protrusion at L4-5,  potentially affecting the descending right L5 nerve root. 3. Small residual and/or recurrent central disc protrusion at L5-S1, closely approximating and potentially affecting either of the descending S1 nerve roots.  PATIENT SURVEYS:  FOTO    POSTURE: No Significant postural limitations  PALPATION: Very tender to palpation over right lower lumbar/SIJ and proximal gluteal region.  LUMBAR ROM:   Functional movement. LOWER EXTREMITY MMT:    Normal bilateral LE strength.  LUMBAR SPECIAL TESTS:  Equal leg lengths. Some pain increase with a right SLR. (-) FABER test.   Normal LE DTR's.  GAIT: Antalgic in nature.  Patient in obvious pain.  TRANSFERS/TRANSITORY MOVEMENTS:   Slow and cautious due to pain.  TODAY'S TREATMENT:                                                                                                                              DATE: 02/11/2023                                    EXERCISE LOG  Exercise Repetitions and Resistance Comments  Elliptical  L3/L3 x 10 mins (5 mins forward/5 mins backward)   Cybex Knee Flexion 30# x 3.5 mins   Cybex Knee Extension 10# x 3.5 mins   Cybex Leg Press 2 plates; seat 5 x 3 mins   [employer]    Piriformis Stretch 5 reps x 30 sec hold   Supine HS Stretch 5 reps x 30 sec hold    Blank cell = exercise not performed today   Manual Therapy Soft Tissue Mobilization: right hip, STW/M to right hip region with marked tenderness over trochanteric bursa    Modalities  Date:  Unattended Estim: Lumbar, IFC 80-150 Hz, 15 mins, Pain Hot Pack: Lumbar, 15 mins, Pain and Tone   PATIENT EDUCATION:    HOME EXERCISE PROGRAM:  HOME EXERCISE PROGRAM Created by [place] Applegate Aug 6th, 2024 View at [email or web address] using code: [email or web address]  Page 1 of 1 3 Exercises Bridges While laying on your back, contract the low abdominals and lift from the hips. Repeat 15 Times Hold  2 Seconds Complete 2 Sets Perform 2  Times a Day SINGLE KNEE TO CHEST STRETCH - SKTC While Lying on your back, hold your knee and gently pull it up towards your chest. Repeat 3 Times Hold 30 Seconds Complete 1 Set Perform 3 Times a Day Double Knee to chest stretch Pull your knees up to your chest hold for 30 seconds then relax. Repeat Continue to breathe throughout stretch. Repeat 2 Times Hold 30 Seconds Complete 3 Sets Perform 2 Times a Day  ASSESSMENT:  CLINICAL IMPRESSION:  Pt arrives for today's treatment session reporting 2/10 right hip and buttock pain.  Pt able to tolerate increased time on cybex knee flexion and extension machines today with mild fatigue reported.  Pt introduces to cybex leg press machine today with min cues required for eccentric control.  Pt with good results from supine stretches.  STW/M performed to right hip region with notable tenderness over trochanteric bursa.  Discussed possibility of dry needling with supervising PT, [place] Applegate.  Normal responses to estim and MH noted upon removal.  Pt denied any pain at completion of today's treatment session.    OBJECTIVE IMPAIRMENTS: Abnormal gait, decreased activity tolerance, decreased mobility, increased muscle spasms, and pain.   ACTIVITY LIMITATIONS: carrying, lifting, bending, sitting, standing, sleeping, transfers, bed mobility, dressing, and locomotion level  PARTICIPATION LIMITATIONS: meal prep, cleaning, and laundry  PERSONAL FACTORS: 1 comorbidity: previous lumbar surgery  are also affecting patient's functional outcome.   REHAB POTENTIAL: Good  CLINICAL DECISION MAKING: Stable/uncomplicated  EVALUATION COMPLEXITY: Low   GOALS:  SHORT TERM GOALS: Target date: 02/04/23  Ind with a HEP. Goal status: MET  2.  Eliminate right LE symptoms.  Goal status: PARTIALLY MET  3.  Perform ADL's with pain not > 3/10.  Goal status: MET  4.  Sit 30 minutes with pain not > 3/10.  Goal status: IN PROGRESS  5.  Sit 20 minutes with pain  not > 3/10.   Goal status: MET  PLAN:  PT FREQUENCY: 2x/week  PT DURATION: 4 weeks  PLANNED INTERVENTIONS: Therapeutic exercises, Therapeutic activity, Patient/Family education, Self Care, Dry Needling, Electrical stimulation, Cryotherapy, Moist heat, Traction, Ultrasound, and Manual therapy.  PLAN FOR NEXT SESSION: Elliptical    Newman Pies, PTA 02/11/2023, 2:28 PM

## 2023-02-12 ENCOUNTER — Ambulatory Visit: Payer: BC Managed Care – PPO | Admitting: *Deleted

## 2023-02-12 DIAGNOSIS — M62838 Other muscle spasm: Secondary | ICD-10-CM

## 2023-02-12 DIAGNOSIS — M5459 Other low back pain: Secondary | ICD-10-CM | POA: Diagnosis not present

## 2023-02-12 NOTE — Therapy (Signed)
OUTPATIENT PHYSICAL THERAPY THORACOLUMBAR TREATMENT   Patient Name: Maria Velasquez MRN: [id number] DOB:1976-05-01, 47 y.o., female Today's Date: 02/12/2023  END OF SESSION:  PT End of Session - 02/12/23 0809     Visit Number 8    Number of Visits 8    Date for PT Re-Evaluation 02/04/23    Authorization Type FOTO.    PT Start Time 0805    PT Stop Time 0850    PT Time Calculation (min) 45 min              Past Medical History:  Diagnosis Date   Cancer (HCC)    Vaginal   Headache    otc med prn   Heartburn    History of cervical dysplasia    History of uterine fibroid    Lipoma    Vaginal dysplasia    Past Surgical History:  Procedure Laterality Date   BIOPSY  02/09/2021   Procedure: BIOPSY;  Surgeon: Lanelle Bal, DO;  [facility];  Service: Endoscopy;;   CO2 LASER APPLICATION N/A 10/26/2020   Procedure: CO2 LASER APPLICATION OF VAGINA;  Surgeon: Carver Fila, MD;  Location: [facility];  Service: Gynecology;  Laterality: N/A;   COLONOSCOPY WITH PROPOFOL N/A 02/09/2021   Procedure: COLONOSCOPY WITH PROPOFOL;  Surgeon: Lanelle Bal, DO;  [facility];  Service: Endoscopy;  Laterality: N/A;  10:00am   DILATION AND CURETTAGE OF UTERUS  08/05/2009  @[facility]    missed abortion   ESOPHAGOGASTRODUODENOSCOPY (EGD) WITH PROPOFOL N/A 02/09/2021   Procedure: ESOPHAGOGASTRODUODENOSCOPY (EGD) WITH PROPOFOL;  Surgeon: Lanelle Bal, DO;  [facility];  Service: Endoscopy;  Laterality: N/A;   LAPAROSCOPIC SALPINGO OOPHERECTOMY Right 08-28-2018  @[facility]    ovarian torsion   LAPAROSCOPIC TUBAL LIGATION W/ FILCHIE CLIPS Bilateral 03-20-2010  @[facility]    LUMBAR DISC SURGERY  01-30-2007  @[facility]    L4-- L5   ROBOTIC ASSISTED BILATERAL SALPINGO OOPHERECTOMY Bilateral 11/16/2014   Procedure: ROBOTIC ASSISTED BILATERAL SALPINGECTOMY;  Surgeon: Carrington Clamp, MD;  Location: [facility];  Service: Gynecology;  Laterality: Bilateral;   ROBOTIC  ASSISTED TOTAL HYSTERECTOMY N/A 11/16/2014   w/ BS   VULVA Ples Specter BIOPSY N/A 10/26/2020   Procedure: VAGINAL BIOPSY;  Surgeon: Carver Fila, MD;  Location: [facility];  Service: Gynecology;  Laterality: N/A;   WISDOM TOOTH EXTRACTION  1997   Patient Active Problem List   Diagnosis Date Noted   Radiculopathy, lumbar region, right L5 01/01/2023   Herniated nucleus pulposus, L4-5 right 01/01/2023   Pain of upper abdomen 01/01/2021   Gastroesophageal reflux disease 01/01/2021   Bacterial vaginosis 10/05/2020   Vaginal dysplasia 10/05/2020   REFERRING PROVIDER: Ernestine Mcmurray  REFERRING DIAG: Radiculopathy, lumbar region.  Herniated nucleus pulposus, L4-5, right.  Rationale for Evaluation and Treatment: Rehabilitation  THERAPY DIAG:  Other low back pain  Other muscle spasm  ONSET DATE: March 08657.  SUBJECTIVE:  SUBJECTIVE STATEMENT: Pt reports having to take a mm  relaxer last night , but better this morning.  PERTINENT HISTORY:  Prior left lumbar microdiskectomy.  PAIN:  Are you having pain? No  PRECAUTIONS: None  RED FLAGS: None   WEIGHT BEARING RESTRICTIONS: No  FALLS:  Has patient fallen in last 6 months? 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  Get back to where she was before the onset of pain.  OBJECTIVE:   DIAGNOSTIC FINDINGS:  IMPRESSION:  MRI: 1. Postoperative changes from prior left posterior decompression with micro discectomy at L4-5 and L5-S1. 2. Right subarticular disc protrusion at L4-5, potentially affecting the descending right L5 nerve root. 3. Small  residual and/or recurrent central disc protrusion at L5-S1, closely approximating and potentially affecting either of the descending S1 nerve roots.  PATIENT SURVEYS:  FOTO    POSTURE: No Significant postural limitations  PALPATION: Very tender to palpation over right lower lumbar/SIJ and proximal gluteal region.  LUMBAR ROM:   Functional movement. LOWER EXTREMITY MMT:    Normal bilateral LE strength.  LUMBAR SPECIAL TESTS:  Equal leg lengths. Some pain increase with a right SLR. (-) FABER test.   Normal LE DTR's.  GAIT: Antalgic in nature.  Patient in obvious pain.  TRANSFERS/TRANSITORY MOVEMENTS:   Slow and cautious due to pain.  TODAY'S TREATMENT:                                                                                                                              DATE:                                                               02/12/2023                                    EXERCISE LOG  Exercise Repetitions and Resistance Comments  Elliptical  L3/L3 x 10 mins (5 mins forward/5 mins backward)   Cybex Knee Flexion 30# x 3.5 mins   Cybex Knee Extension 10# x 3.5  mins   Cybex Leg Press    [employer]    Piriformis Stretch    Supine HS Stretch     Blank cell = exercise not performed today   Manual Therapy Soft Tissue Mobilization:  ,      Modalities  Date:  Unattended Estim: Lumbar, IFC 80-150 Hz, 15 mins, Pain Hot Pack: Lumbar, 15 mins, Pain and Tone   PATIENT EDUCATION:  HOME EXERCISE PROGRAM:  HOME EXERCISE PROGRAM Created by [place] Applegate Aug 6th, 2024 View at [email or web address] using code: [email or web address]  Page 1 of 1 3 Exercises Bridges While laying on your back, contract the low abdominals and lift from the hips. Repeat 15 Times Hold 2 Seconds Complete 2 Sets Perform 2 Times a Day SINGLE KNEE TO CHEST STRETCH - SKTC While Lying on your back, hold your knee and gently pull it up towards your chest. Repeat 3 Times  Hold 30 Seconds Complete 1 Set Perform 3 Times a Day Double Knee to chest stretch Pull your knees up to your chest hold for 30 seconds then relax. Repeat Continue to breathe throughout stretch. Repeat 2 Times Hold 30 Seconds Complete 3 Sets Perform 2 Times a Day  ASSESSMENT:  CLINICAL IMPRESSION:  Pt arrives today doing fairly well with low pain levels , but reports having to take a mm relaxer last night. Pt did well with Therex today, but requested not to perform leg press and stretches.  3/5 Tgs met. Others NM yet due to pain   OBJECTIVE IMPAIRMENTS: Abnormal gait, decreased activity tolerance, decreased mobility, increased muscle spasms, and pain.   ACTIVITY LIMITATIONS: carrying, lifting, bending, sitting, standing, sleeping, transfers, bed mobility, dressing, and locomotion level  PARTICIPATION LIMITATIONS: meal prep, cleaning, and laundry  PERSONAL FACTORS: 1 comorbidity: previous lumbar surgery  are also affecting patient's functional outcome.   REHAB POTENTIAL: Good  CLINICAL DECISION MAKING: Stable/uncomplicated  EVALUATION COMPLEXITY: Low   GOALS:  SHORT TERM GOALS: Target date: 02/04/23  Ind with a HEP. Goal status: MET  2.  Eliminate right LE symptoms.  Goal status: PARTIALLY MET  3.  Perform ADL's with pain not > 3/10.  Goal status: MET  4.  Sit 30 minutes with pain not > 3/10.  Goal status: IN PROGRESS  5.  Sit 20 minutes with pain not > 3/10.   Goal status: MET  PLAN:  PT FREQUENCY: 2x/week  PT DURATION: 4 weeks  PLANNED INTERVENTIONS: Therapeutic exercises, Therapeutic activity, Patient/Family education, Self Care, Dry Needling, Electrical stimulation, Cryotherapy, Moist heat, Traction, Ultrasound, and Manual therapy.  PLAN FOR NEXT SESSION: Recert for more visits to meet LTGs   Advika Mclelland,CHRIS, PTA 02/12/2023, 8:58 AM

## 2023-02-14 NOTE — Addendum Note (Signed)
Addended by: Solaris Kram, [place] W on: 02/14/2023 01:51 PM   Modules accepted: Orders

## 2023-02-18 ENCOUNTER — Ambulatory Visit: Payer: BC Managed Care – PPO

## 2023-02-18 DIAGNOSIS — M5459 Other low back pain: Secondary | ICD-10-CM | POA: Diagnosis not present

## 2023-02-18 DIAGNOSIS — M62838 Other muscle spasm: Secondary | ICD-10-CM

## 2023-02-18 NOTE — Therapy (Signed)
OUTPATIENT PHYSICAL THERAPY THORACOLUMBAR TREATMENT   Patient Name: Maria Velasquez MRN: [id number] DOB:1975/12/18, 48 y.o., female Today's Date: 02/18/2023  END OF SESSION:  PT End of Session - 02/18/23 1102     Visit Number 9    Number of Visits 14    Date for PT Re-Evaluation 03/04/23    Authorization Type FOTO.    PT Start Time 1100    Activity Tolerance Patient tolerated treatment well    Behavior During Therapy WFL for tasks assessed/performed              Past Medical History:  Diagnosis Date   Cancer (HCC)    Vaginal   Headache    otc med prn   Heartburn    History of cervical dysplasia    History of uterine fibroid    Lipoma    Vaginal dysplasia    Past Surgical History:  Procedure Laterality Date   BIOPSY  02/09/2021   Procedure: BIOPSY;  Surgeon: Lanelle Bal, DO;  [facility];  Service: Endoscopy;;   CO2 LASER APPLICATION N/A 10/26/2020   Procedure: CO2 LASER APPLICATION OF VAGINA;  Surgeon: Carver Fila, MD;  Location: [facility];  Service: Gynecology;  Laterality: N/A;   COLONOSCOPY WITH PROPOFOL N/A 02/09/2021   Procedure: COLONOSCOPY WITH PROPOFOL;  Surgeon: Lanelle Bal, DO;  [facility];  Service: Endoscopy;  Laterality: N/A;  10:00am   DILATION AND CURETTAGE OF UTERUS  08/05/2009  @[facility]    missed abortion   ESOPHAGOGASTRODUODENOSCOPY (EGD) WITH PROPOFOL N/A 02/09/2021   Procedure: ESOPHAGOGASTRODUODENOSCOPY (EGD) WITH PROPOFOL;  Surgeon: Lanelle Bal, DO;  [facility];  Service: Endoscopy;  Laterality: N/A;   LAPAROSCOPIC SALPINGO OOPHERECTOMY Right 08-28-2018  @[facility]    ovarian torsion   LAPAROSCOPIC TUBAL LIGATION W/ FILCHIE CLIPS Bilateral 03-20-2010  @[facility]    LUMBAR DISC SURGERY  01-30-2007  @[facility]    L4-- L5   ROBOTIC ASSISTED BILATERAL SALPINGO OOPHERECTOMY Bilateral 11/16/2014   Procedure: ROBOTIC ASSISTED BILATERAL SALPINGECTOMY;  Surgeon: Carrington Clamp, MD;  Location: [facility];   Service: Gynecology;  Laterality: Bilateral;   ROBOTIC ASSISTED TOTAL HYSTERECTOMY N/A 11/16/2014   w/ BS   VULVA Ples Specter BIOPSY N/A 10/26/2020   Procedure: VAGINAL BIOPSY;  Surgeon: Carver Fila, MD;  [facility];  Service: Gynecology;  Laterality: N/A;   WISDOM TOOTH EXTRACTION  1997   Patient Active Problem List   Diagnosis Date Noted   Radiculopathy, lumbar region, right L5 01/01/2023   Herniated nucleus pulposus, L4-5 right 01/01/2023   Pain of upper abdomen 01/01/2021   Gastroesophageal reflux disease 01/01/2021   Bacterial vaginosis 10/05/2020   Vaginal dysplasia 10/05/2020   REFERRING PROVIDER: Ernestine Mcmurray  REFERRING DIAG: Radiculopathy, lumbar region.  Herniated nucleus pulposus, L4-5, right.  Rationale for Evaluation and Treatment: Rehabilitation  THERAPY DIAG:  Other low back pain  Other muscle spasm  ONSET DATE: March 13244.  SUBJECTIVE:  SUBJECTIVE STATEMENT: Pt denies any pain today.    PERTINENT HISTORY:  Prior left lumbar microdiskectomy.  PAIN:  Are you having pain? No  PRECAUTIONS: None  RED FLAGS: None   WEIGHT BEARING RESTRICTIONS: No  FALLS:  Has patient fallen in last 6 months? 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  Get back to where she was before the onset of pain.  OBJECTIVE:   DIAGNOSTIC FINDINGS:  IMPRESSION:  MRI: 1. Postoperative changes from prior left posterior decompression with micro discectomy at L4-5 and L5-S1. 2. Right subarticular disc protrusion at L4-5, potentially affecting the descending right L5 nerve root. 3.  Small residual and/or recurrent central disc protrusion at L5-S1, closely approximating and potentially affecting either of the descending S1 nerve roots.  PATIENT SURVEYS:  FOTO    POSTURE: No Significant postural limitations  PALPATION: Very tender to palpation over right lower lumbar/SIJ and proximal gluteal region.  LUMBAR ROM:   Functional movement. LOWER EXTREMITY MMT:    Normal bilateral LE strength.  LUMBAR SPECIAL TESTS:  Equal leg lengths. Some pain increase with a right SLR. (-) FABER test.   Normal LE DTR's.  GAIT: Antalgic in nature.  Patient in obvious pain.  TRANSFERS/TRANSITORY MOVEMENTS:   Slow and cautious due to pain.  TODAY'S TREATMENT:                                                                                                                              DATE: 02/18/2023                                   EXERCISE LOG  Exercise Repetitions and Resistance Comments  Elliptical  L3/L3 x 10 mins (5 mins forward/5 mins backward)   Cybex Knee Flexion 40# x 3 mins   Cybex Knee Extension 20# x 3 mins   Cybex Leg Press 2 plates; seat 5 x 3.5 mins   Squats BOSU ball down x 10 reps   Monster Walks [place] x 5 reps   Side Stepping Green x 5 reps        Blank cell = exercise not performed today  Modalities  Date:  Unattended Estim: Lumbar, IFC 80-150 Hz, 15 mins, Pain Hot Pack: Lumbar, 15 mins, Pain and Tone   PATIENT EDUCATION:    HOME EXERCISE PROGRAM:  HOME EXERCISE PROGRAM Created by [place] Applegate Aug 6th, 2024 View at [email or web address] using code: [email or web address]  Page 1 of 1 3 Exercises Bridges While laying on your back, contract the low abdominals and lift from the hips. Repeat 15 Times Hold 2 Seconds Complete 2 Sets Perform 2 Times a Day SINGLE KNEE TO CHEST STRETCH - SKTC While Lying on your back, hold your knee  and gently pull it up towards your chest. Repeat 3 Times Hold 30 Seconds Complete 1 Set Perform 3 Times a Day Double Knee  to chest stretch Pull your knees up to your chest hold for 30 seconds then relax. Repeat Continue to breathe throughout stretch. Repeat 2 Times Hold 30 Seconds Complete 3 Sets Perform 2 Times a Day  ASSESSMENT:  CLINICAL IMPRESSION:  Pt arrives for today's treatment session denying any pain.  Pt introduced to resisted side stepping and monster walks today.  Pt denied any pain with new exercises, but does report increased fatigue at completion of exercises.  Pt challenged by squats on BOSU ball today with intermittent UE support required for safety.  Pt able to tolerate increased weight with cybex leg extension and flexion without discomfort.  Normal responses to estim and MH noted upon removal.  Pt denied any pain at completion of today's treatment session.  OBJECTIVE IMPAIRMENTS: Abnormal gait, decreased activity tolerance, decreased mobility, increased muscle spasms, and pain.   ACTIVITY LIMITATIONS: carrying, lifting, bending, sitting, standing, sleeping, transfers, bed mobility, dressing, and locomotion level  PARTICIPATION LIMITATIONS: meal prep, cleaning, and laundry  PERSONAL FACTORS: 1 comorbidity: previous lumbar surgery  are also affecting patient's functional outcome.   REHAB POTENTIAL: Good  CLINICAL DECISION MAKING: Stable/uncomplicated  EVALUATION COMPLEXITY: Low   GOALS:  SHORT TERM GOALS: Target date: 02/04/23  Ind with a HEP. Goal status: MET  2.  Eliminate right LE symptoms.  Goal status: PARTIALLY MET  3.  Perform ADL's with pain not > 3/10.  Goal status: MET  4.  Sit 30 minutes with pain not > 3/10.  Goal status: IN PROGRESS  5.  Sit 20 minutes with pain not > 3/10.   Goal status: MET  PLAN:  PT FREQUENCY: 2x/week  PT DURATION: 4 weeks  PLANNED INTERVENTIONS: Therapeutic exercises, Therapeutic activity, Patient/Family education, Self Care, Dry Needling, Electrical stimulation, Cryotherapy, Moist heat, Traction, Ultrasound, and Manual  therapy.  PLAN FOR NEXT SESSION: Recert for more visits to meet LTGs   Newman Pies, PTA 02/18/2023, 11:58 AM

## 2023-02-20 ENCOUNTER — Ambulatory Visit: Payer: BC Managed Care – PPO

## 2023-02-20 DIAGNOSIS — M5459 Other low back pain: Secondary | ICD-10-CM | POA: Diagnosis not present

## 2023-02-20 DIAGNOSIS — M62838 Other muscle spasm: Secondary | ICD-10-CM

## 2023-02-20 NOTE — Therapy (Addendum)
OUTPATIENT PHYSICAL THERAPY THORACOLUMBAR TREATMENT   Patient Name: Maria Velasquez MRN: [id number] DOB:1976/05/02, 47 y.o., female Today's Date: 02/20/2023  END OF SESSION:  PT End of Session - 02/20/23 1108     Visit Number 10    Number of Visits 14    Date for PT Re-Evaluation 03/04/23    Authorization Type FOTO.    PT Start Time 1100    PT Stop Time 1200    PT Time Calculation (min) 60 min    Activity Tolerance Patient tolerated treatment well    Behavior During Therapy WFL for tasks assessed/performed              Past Medical History:  Diagnosis Date   Cancer (HCC)    Vaginal   Headache    otc med prn   Heartburn    History of cervical dysplasia    History of uterine fibroid    Lipoma    Vaginal dysplasia    Past Surgical History:  Procedure Laterality Date   BIOPSY  02/09/2021   Procedure: BIOPSY;  Surgeon: Lanelle Bal, DO;  [facility];  Service: Endoscopy;;   CO2 LASER APPLICATION N/A 10/26/2020   Procedure: CO2 LASER APPLICATION OF VAGINA;  Surgeon: Carver Fila, MD;  Location: [facility];  Service: Gynecology;  Laterality: N/A;   COLONOSCOPY WITH PROPOFOL N/A 02/09/2021   Procedure: COLONOSCOPY WITH PROPOFOL;  Surgeon: Lanelle Bal, DO;  Location: [facility];  Service: Endoscopy;  Laterality: N/A;  10:00am   DILATION AND CURETTAGE OF UTERUS  08/05/2009  @[facility]    missed abortion   ESOPHAGOGASTRODUODENOSCOPY (EGD) WITH PROPOFOL N/A 02/09/2021   Procedure: ESOPHAGOGASTRODUODENOSCOPY (EGD) WITH PROPOFOL;  Surgeon: Lanelle Bal, DO;  [facility];  Service: Endoscopy;  Laterality: N/A;   LAPAROSCOPIC SALPINGO OOPHERECTOMY Right 08-28-2018  @[facility]    ovarian torsion   LAPAROSCOPIC TUBAL LIGATION W/ FILCHIE CLIPS Bilateral 03-20-2010  @[facility]    LUMBAR DISC SURGERY  01-30-2007  @[facility]    L4-- L5   ROBOTIC ASSISTED BILATERAL SALPINGO OOPHERECTOMY Bilateral 11/16/2014   Procedure: ROBOTIC ASSISTED BILATERAL  SALPINGECTOMY;  Surgeon: Carrington Clamp, MD;  Location: [facility];  Service: Gynecology;  Laterality: Bilateral;   ROBOTIC ASSISTED TOTAL HYSTERECTOMY N/A 11/16/2014   w/ BS   VULVA Ples Specter BIOPSY N/A 10/26/2020   Procedure: VAGINAL BIOPSY;  Surgeon: Carver Fila, MD;  [facility];  Service: Gynecology;  Laterality: N/A;   WISDOM TOOTH EXTRACTION  1997   Patient Active Problem List   Diagnosis Date Noted   Radiculopathy, lumbar region, right L5 01/01/2023   Herniated nucleus pulposus, L4-5 right 01/01/2023   Pain of upper abdomen 01/01/2021   Gastroesophageal reflux disease 01/01/2021   Bacterial vaginosis 10/05/2020   Vaginal dysplasia 10/05/2020   REFERRING PROVIDER: Ernestine Mcmurray  REFERRING DIAG: Radiculopathy, lumbar region.  Herniated nucleus pulposus, L4-5, right.  Rationale for Evaluation and Treatment: Rehabilitation  THERAPY DIAG:  Other low back pain  Other muscle spasm  ONSET DATE: March 34742.  SUBJECTIVE:  SUBJECTIVE STATEMENT: Pt denies any pain today.    PERTINENT HISTORY:  Prior left lumbar microdiskectomy.  PAIN:  Are you having pain? No  PRECAUTIONS: None  RED FLAGS: None   WEIGHT BEARING RESTRICTIONS: No  FALLS:  Has patient fallen in last 6 months? 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  Get back to where she was before the onset of pain.  OBJECTIVE:   DIAGNOSTIC FINDINGS:  IMPRESSION:  MRI: 1. Postoperative changes from prior left posterior decompression with micro discectomy at L4-5 and L5-S1. 2. Right subarticular disc protrusion at L4-5,  potentially affecting the descending right L5 nerve root. 3. Small residual and/or recurrent central disc protrusion at L5-S1, closely approximating and potentially affecting either of the descending S1 nerve roots.  PATIENT SURVEYS:  FOTO    POSTURE: No Significant postural limitations  PALPATION: Very tender to palpation over right lower lumbar/SIJ and proximal gluteal region.  LUMBAR ROM:   Functional movement. LOWER EXTREMITY MMT:    Normal bilateral LE strength.  LUMBAR SPECIAL TESTS:  Equal leg lengths. Some pain increase with a right SLR. (-) FABER test.   Normal LE DTR's.  GAIT: Antalgic in nature.  Patient in obvious pain.  TRANSFERS/TRANSITORY MOVEMENTS:   Slow and cautious due to pain.  TODAY'S TREATMENT:                                                                                                                              DATE: 02/20/2023                                   EXERCISE LOG  Exercise Repetitions and Resistance Comments  Elliptical  L4/L4 x 10 mins (5 mins forward/5 mins backward)   Cybex Knee Flexion 40# x 3.5 mins   Cybex Knee Extension 20# x 3.5 mins   Cybex Leg Press 2 plates; seat 5 x 3.5 mins   Squats BOSU ball down x 10 reps   [place] x 5 reps   Side Stepping [place] x 5 reps        Blank cell = exercise not performed today  Modalities  Date:  Unattended Estim: Lumbar, IFC 80-150 Hz, 15 mins, Pain Hot Pack: Lumbar, 15 mins, Pain and Tone   PATIENT EDUCATION:    HOME EXERCISE PROGRAM:  HOME EXERCISE PROGRAM Created by [place] Applegate Aug 6th, 2024 View at [email or web address] using code: [email or web address]  Page 1 of 1 3 Exercises Bridges While laying on your back, contract the low abdominals and lift from the hips. Repeat 15 Times Hold 2 Seconds Complete 2 Sets Perform 2 Times a Day SINGLE KNEE TO CHEST STRETCH - SKTC While Lying on your back, hold your knee  and gently pull it up towards your chest. Repeat 3 Times  Hold 30 Seconds Complete 1 Set Perform 3 Times a Day Double Knee to chest stretch Pull your knees up to your chest hold for 30 seconds then relax. Repeat Continue to breathe throughout stretch. Repeat 2 Times Hold 30 Seconds Complete 3 Sets Perform 2 Times a Day  ASSESSMENT:  CLINICAL IMPRESSION:  Pt arrives for today's treatment denying any pain.  Pt reports that she experienced minimal soreness after last treatment session.  Reviewed exercises performed at previously treatment per patient request.  Pt has made progress towards all of her goals at this time, meeting all of her goals except eliminating her radicular symptoms.  Normal responses to estim and MH noted upon removal. Pt denied any pain at completion of today's treatment session.  OBJECTIVE IMPAIRMENTS: Abnormal gait, decreased activity tolerance, decreased mobility, increased muscle spasms, and pain.   ACTIVITY LIMITATIONS: carrying, lifting, bending, sitting, standing, sleeping, transfers, bed mobility, dressing, and locomotion level  PARTICIPATION LIMITATIONS: meal prep, cleaning, and laundry  PERSONAL FACTORS: 1 comorbidity: previous lumbar surgery  are also affecting patient's functional outcome.   REHAB POTENTIAL: Good  CLINICAL DECISION MAKING: Stable/uncomplicated  EVALUATION COMPLEXITY: Low   GOALS:  SHORT TERM GOALS: Target date: 02/04/23  Ind with a HEP. Goal status: MET  2.  Eliminate right LE symptoms.  Goal status: PARTIALLY MET  3.  Perform ADL's with pain not > 3/10.  Goal status: MET  4.  Sit 30 minutes with pain not > 3/10.  Goal status: MET  5.  Sit 20 minutes with pain not > 3/10.   Goal status: MET  PLAN:  PT FREQUENCY: 2x/week  PT DURATION: 4 weeks  PLANNED INTERVENTIONS: Therapeutic exercises, Therapeutic activity, Patient/Family education, Self Care, Dry Needling, Electrical stimulation, Cryotherapy, Moist heat, Traction, Ultrasound, and Manual therapy.  PLAN FOR NEXT SESSION:  Recert for more visits to meet LTGs   Newman Pies, PTA 02/20/2023, 12:02 PM   Progress Note Reporting Period 01/07/23 to 02/25/23.  See note below for Objective Data and Assessment of Progress/Goals. Very good progress.  Goals #1, 3, 4 and 5 met at this time.    [place] Applegate MPT

## 2023-02-26 ENCOUNTER — Ambulatory Visit: Payer: BC Managed Care – PPO

## 2023-03-03 ENCOUNTER — Encounter: Payer: Self-pay | Admitting: Neurosurgery

## 2023-03-03 ENCOUNTER — Ambulatory Visit (INDEPENDENT_AMBULATORY_CARE_PROVIDER_SITE_OTHER): Payer: BC Managed Care – PPO | Admitting: Neurosurgery

## 2023-03-03 VITALS — BP 128/86 | Ht 63.0 in | Wt 179.0 lb

## 2023-03-03 DIAGNOSIS — M5416 Radiculopathy, lumbar region: Secondary | ICD-10-CM

## 2023-03-03 DIAGNOSIS — M5116 Intervertebral disc disorders with radiculopathy, lumbar region: Secondary | ICD-10-CM

## 2023-03-03 DIAGNOSIS — M5126 Other intervertebral disc displacement, lumbar region: Secondary | ICD-10-CM

## 2023-03-03 MED ORDER — GABAPENTIN (ONCE-DAILY) 300 MG PO TABS: 300.0000 mg | ORAL_TABLET | Freq: Every evening | ORAL | 2 refills | Status: DC

## 2023-03-03 NOTE — Progress Notes (Signed)
Referring Physician:  [name], PA-C [address] [place],  [place] [address]  Primary Physician:  [name], PA-C  History of Present Illness: 03/03/2023 Maria Velasquez is here today with a chief complaint of right posterior flank/buttocks pain radiating down her right leg down to her lateral lower leg and lateral aspect of her foot.  History of a previous left-sided radiculopathy for which she had decompressions.  When we saw her last time she had significant weakness in her right lower extremity as well as radiating pain however she had not had conservative therapy and she wanted to try more conservative therapy before going forward with surgery as she has already been through it twice.  In the interim she has had an improvement in her pain with physical therapy.  She does have some intermittent tingling down her leg consistent with radicular symptoms, she does continue to have weakness without significant improvement in her exam, however her control of her pain is much better.  She is not taking any oral medications like neuropathic medicine such as gabapentin.   I have utilized the care everywhere function in epic to review the outside records available from external health systems.  Review of Systems:  A 10 point review of systems is negative, except for the pertinent positives and negatives detailed in the HPI.  Past Medical History: Past Medical History:  Diagnosis Date   Cancer (HCC)    Vaginal   Headache    otc med prn   Heartburn    History of cervical dysplasia    History of uterine fibroid    Lipoma    Vaginal dysplasia     Past Surgical History: Past Surgical History:  Procedure Laterality Date   BIOPSY  02/09/2021   Procedure: BIOPSY;  Surgeon: Lanelle Bal, DO;  [facility];  Service: Endoscopy;;   CO2 LASER APPLICATION N/A 10/26/2020   Procedure: CO2 LASER APPLICATION OF VAGINA;  Surgeon: Carver Fila, MD;  Location: [facility];  Service: Gynecology;  Laterality: N/A;   COLONOSCOPY WITH PROPOFOL N/A 02/09/2021   Procedure: COLONOSCOPY WITH PROPOFOL;  Surgeon: Lanelle Bal, DO;  [facility];  Service: Endoscopy;  Laterality: N/A;  10:00am   DILATION AND CURETTAGE OF UTERUS  08/05/2009  @[facility]    missed abortion   ESOPHAGOGASTRODUODENOSCOPY (EGD) WITH PROPOFOL N/A 02/09/2021   Procedure: ESOPHAGOGASTRODUODENOSCOPY (EGD) WITH PROPOFOL;  Surgeon: Lanelle Bal, DO;  [facility];  Service: Endoscopy;  Laterality: N/A;   LAPAROSCOPIC SALPINGO OOPHERECTOMY Right 08-28-2018  @[facility]    ovarian torsion   LAPAROSCOPIC TUBAL LIGATION W/ FILCHIE CLIPS Bilateral 03-20-2010  @[facility]    LUMBAR DISC SURGERY  01-30-2007  @[facility]    L4-- L5   ROBOTIC ASSISTED BILATERAL SALPINGO OOPHERECTOMY Bilateral 11/16/2014   Procedure: ROBOTIC ASSISTED BILATERAL SALPINGECTOMY;  Surgeon: Carrington Clamp, MD;  Location: [facility];  Service: Gynecology;  Laterality: Bilateral;   ROBOTIC ASSISTED TOTAL HYSTERECTOMY N/A 11/16/2014   w/ BS   VULVA Ples Specter BIOPSY N/A 10/26/2020   Procedure: VAGINAL BIOPSY;  Surgeon: Carver Fila, MD;  Location: [facility];  Service: Gynecology;  Laterality: N/A;   WISDOM TOOTH EXTRACTION  1997    Allergies: Allergies as of 03/03/2023 - Review Complete 03/03/2023  Allergen Reaction Noted   Ciprofloxacin  10/04/2020   Oxycodone Hives, Itching, and Other (See Comments) 11/02/2014   Prednisone Other (See Comments) 08/28/2018    Medications:  Current Outpatient Medications:  ALPRAZolam (XANAX) 0.5 MG tablet, Take 0.5 mg by mouth daily as needed for anxiety., Disp: , Rfl:    estradiol (ESTRACE) 0.1 MG/GM vaginal cream, Place 1 g vaginally once a week., Disp: , Rfl:    Gabapentin, Once-Daily, 300 MG TABS, Take 1 tablet (300 mg total) by mouth every evening., Disp: 30 tablet, Rfl: 2   HYDROmorphone HCl (DILAUDID PO), Take by mouth., Disp: , Rfl:    ibuprofen (ADVIL)  200 MG tablet, Take 200 mg by mouth every 6 (six) hours as needed., Disp: , Rfl:    Multiple Vitamins-Minerals (ADULT ONE DAILY GUMMIES PO), Take 2 tablets by mouth in the morning., Disp: , Rfl:    omeprazole (PRILOSEC) 40 MG capsule, TAKE ONE CAPSULE BY MOUTH DAILY 30 MINUTES BEFORE breakfast, Disp: 30 capsule, Rfl: 11   PHENTERMINE HCL PO, Take by mouth., Disp: , Rfl:    traZODone (DESYREL) 50 MG tablet, Take 1 tablet (50 mg total) by mouth at bedtime as needed for sleep., Disp: 30 tablet, Rfl: 0  Current Facility-Administered Medications:    methylPREDNISolone acetate (DEPO-MEDROL) injection 80 mg, 80 mg, Other, Once,   Social History: Social History   Tobacco Use   Smoking status: Never   Smokeless tobacco: Never  Vaping Use   Vaping status: Never Used  Substance Use Topics   Alcohol use: Yes    Comment: occasional wine   Drug use: Never    Family Medical History: Family History  Problem Relation Age of Onset   Breast cancer Mother    Pancreatic cancer Maternal Grandfather        early 21s   Colon cancer Paternal Grandfather        mid 20s    Physical Examination: Vitals:   03/03/23 0934  BP: 128/86    General: Patient is in no apparent distress. Attention to examination is appropriate.  Neck:   Supple.  Full range of motion.  Respiratory: Patient is breathing without any difficulty.   NEUROLOGICAL:     Awake, alert, oriented to person, place, and time.  Speech is clear and fluent.   Cranial Nerves: Pupils equal round and reactive to light.  Facial tone is symmetric.  Facial sensation is symmetric. Shoulder shrug is symmetric. Tongue protrusion is midline.    Strength:  Side Iliopsoas Quads Hamstring PF INV DF EHL  R 5 5 5 5  4+ 5 4+  L 5 5 5 5 5 5 5    Reflexes are 2+ and symmetric at the biceps, triceps, brachioradialis, patella and achilles.   Hoffman's is absent. Clonus is absent  Bilateral upper and lower extremity sensation is intact to light touch  with the exception of my paresthesia in the L5 distribution, positive straight leg raise     Gait is normal.    Imaging: Narrative & Impression  CLINICAL DATA:  Initial evaluation for lower back pain with extension into the right buttocks.   EXAM: MRI LUMBAR SPINE WITHOUT CONTRAST   TECHNIQUE: Multiplanar, multisequence MR imaging of the lumbar spine was performed. No intravenous contrast was administered.   COMPARISON:  Prior radiograph from 10/31/2022 as well as MRI from 12/03/2008.   FINDINGS: Segmentation: Standard. Lowest well-formed disc space labeled the L5-S1 level.   Alignment: Physiologic with preservation of the normal lumbar lordosis. No listhesis.   Vertebrae: Vertebral body height maintained without acute or chronic fracture. Bone marrow signal intensity within normal limits. Few scattered benign hemangiomata noted. No worrisome osseous lesions. Degenerative reactive endplate changes present about  the L5-S1 interspace. No abnormal marrow edema.   Conus medullaris and cauda equina: Conus extends to the L1 level. Conus and cauda equina appear normal.   Paraspinal and other soft tissues: Chronic postoperative scarring present within the lower posterior paraspinous soft tissues. No acute finding. Visualized visceral structures within normal limits.   Disc levels:   L1-2:  Unremarkable.   L2-3:  Unremarkable.   L3-4: Normal interspace. Mild bilateral facet hypertrophy. No spinal stenosis. Foramina remain patent.   L4-5: Degenerative intervertebral disc space narrowing with disc desiccation. Postoperative changes from prior left-sided laminectomy with micro discectomy. There is a superimposed right subarticular disc protrusion with annular fissure at this level (series 8, image 24). Protruding disc closely approximates the descending right L5 nerve root. Superimposed bilateral facet hypertrophy. Resultant mild to moderate right lateral recess stenosis.  Central canal remains patent. No significant foraminal stenosis.   L5-S1: Degenerative vertebral disc space narrowing with disc desiccation and diffuse disc bulge. Associated reactive endplate spurring. Postoperative changes from prior left hemi laminectomy with micro discectomy. Superimposed small central disc protrusion closely approximates and/or contacts the descending S1 nerve roots (series 9, image 29). Mild facet hypertrophy. Residual mild narrowing of the left lateral recess. Central canal remains patent. No significant foraminal stenosis.   IMPRESSION: 1. Postoperative changes from prior left posterior decompression with micro discectomy at L4-5 and L5-S1. 2. Right subarticular disc protrusion at L4-5, potentially affecting the descending right L5 nerve root. 3. Small residual and/or recurrent central disc protrusion at L5-S1, closely approximating and potentially affecting either of the descending S1 nerve roots.     Electronically Signed   By: Rise Mu M.D.   On: 11/15/2022 17:00         I have personally reviewed the images and agree with the above interpretation.  Medical Decision Making/Assessment and Plan:    ICD-10-CM   1. Radiculopathy, lumbar region, right L5  M54.16     2. Herniated nucleus pulposus, L4-5 right  M51.26        Ms. Mccarson is a pleasant 47 y.o. female with a right-sided L4-5 disc herniation causing compression of her right L5 nerve root resulting in a L5 radiculopathy.  She has been dealing with this for approximately 6 months..  She has had conservative management including dry needling, chiropractor, L4-5 TFESI, when we saw her last given the longstanding weakness and her hopes for more conservative therapy we initiated physical therapy.  She has been doing very well with physical therapy has helped with her pain control.  She feels that she is able to tolerate her current symptoms with her physical therapy regimen,/wants to go  through with a few more visits before she makes any decisions on her ongoing symptoms.  Will plan for a follow-up visit in approximately 6 weeks over the phone, we will start her on a low-dose of gabapentin for 300 total daily to see if this helps with her residual nerve pain.  We did discuss with her that given her weakness and ongoing radicular symptoms in the setting of a 4 5 disc herniation with compression of the L5 nerve root that she would qualify for surgical decompression but she would like to go forward with more conservative therapy.  Thank you for involving me in the care of this patient.    Maria Kim MD/MSCR Neurosurgery

## 2023-03-18 ENCOUNTER — Other Ambulatory Visit: Payer: Self-pay | Admitting: Obstetrics and Gynecology

## 2023-03-18 DIAGNOSIS — Z803 Family history of malignant neoplasm of breast: Secondary | ICD-10-CM

## 2023-03-25 ENCOUNTER — Ambulatory Visit (INDEPENDENT_AMBULATORY_CARE_PROVIDER_SITE_OTHER): Payer: BC Managed Care – PPO | Admitting: Orthopaedic Surgery

## 2023-03-25 ENCOUNTER — Encounter: Payer: Self-pay | Admitting: Orthopaedic Surgery

## 2023-03-25 VITALS — BP 125/84 | HR 94

## 2023-03-25 DIAGNOSIS — M5126 Other intervertebral disc displacement, lumbar region: Secondary | ICD-10-CM | POA: Diagnosis not present

## 2023-03-27 ENCOUNTER — Other Ambulatory Visit: Payer: Self-pay | Admitting: Physician Assistant

## 2023-03-28 NOTE — Progress Notes (Signed)
Office Visit Note   Patient: Maria Velasquez           Date of Birth: 1976-03-29           MRN: [id number] Visit Date: 03/25/2023              Requested by: [name], PA-C [address] [place],  [place] [address] PCP: [name], PA-C   Assessment & Plan: Visit Diagnoses:  1. Herniated nucleus pulposus, L4-5 right     Plan: Patient with annular tear disc protrusion L5 radiculopathy with the EHL weakness symptomatic failed conservative treatment including epidurals symptoms greater than 6 months.  Plan repeat microdiscectomy L4-5 using the old scar.  Procedure discussed risks of dural tear, dural repair, repeat rupture possible progression of disc degeneration and and need for spine fusion at some point also discussed.  Previous surgery by me 16 years ago did well with microdiscectomy I expect that this should get her good relief of her severe radicular leg pain.  Questions elicited and answered she understands and request to proceed.  Follow-Up Instructions: No follow-ups on file.   Orders:  No orders of the defined types were placed in this encounter.  No orders of the defined types were placed in this encounter.     Procedures: No procedures performed   Clinical Data: No additional findings.   Subjective: Chief Complaint  Patient presents with   Lower Back - Pain    HPI 47 year old female with past history of 2008 microdiscectomy L4-5 L5-S1 returns with recurrent back pain right leg pain and right L4-5 disc protrusion with annular fissure and right L5 radiculopathy.  She is been seen by Dr. [facility] for epidural injections which just gave her temporary relief.  She has been through chiropractic treatments dry needling, home therapy exercise program, anti-inflammatories, Tylenol, prednisone Dosepak, epidural steroid injection without relief.  Left epidural 12/04/2022 followed by 01/01/2023.  Patient returns to me I did her surgery back in 2008 and patient states  that she wants to proceed with surgery.  Nothing is helping this pain.  Bothers her on a daily basis.  She has been getting ongoing treatment for greater than 6 months without relief.  She has pain with sitting standing standing.  No neurogenic claudication symptoms.  Pain radiates down right buttocks into her right thigh posterior lateral calf and the dorsum of her foot.  She has noticed catching of her foot at times on the right.  Review of Systems patient's had some GE reflux not currently bothering her.  Chronic back and right leg pain previous microdiscectomy surgery L4-5.  No bowel or bladder symptoms.  No fever or chills.   Objective: Vital Signs: BP 125/84   Pulse 94   LMP 10/19/2014   Physical Exam Constitutional:      Appearance: She is well-developed.  HENT:     Head: Normocephalic.     Right Ear: External ear normal.     Left Ear: External ear normal. There is no impacted cerumen.  Eyes:     Pupils: Pupils are equal, round, and reactive to light.  Neck:     Thyroid: No thyromegaly.     Trachea: No tracheal deviation.  Cardiovascular:     Rate and Rhythm: Normal rate.  Pulmonary:     Effort: Pulmonary effort is normal.  Abdominal:     Palpations: Abdomen is soft.  Musculoskeletal:     Cervical back: No rigidity.  Skin:  General: Skin is warm and dry.  Neurological:     Mental Status: She is alert and oriented to person, place, and time.  Psychiatric:        Behavior: Behavior normal.     Ortho Exam well-healed L4-5 incision.  Positive sciatic notch tenderness on the right positive straight leg raising 70 degrees.  Patient has right EHL weakness no weakness on the left anterior tib is trace weak.  Gastrocsoleus is strong both right and left peroneals are strong no posterior tib weakness.  Specialty Comments:  MRI LUMBAR SPINE WITHOUT CONTRAST   TECHNIQUE: Multiplanar, multisequence MR imaging of the lumbar spine was performed. No intravenous contrast was  administered.   COMPARISON:  Prior radiograph from 10/31/2022 as well as MRI from 12/03/2008.   FINDINGS: Segmentation: Standard. Lowest well-formed disc space labeled the L5-S1 level.   Alignment: Physiologic with preservation of the normal lumbar lordosis. No listhesis.   Vertebrae: Vertebral body height maintained without acute or chronic fracture. Bone marrow signal intensity within normal limits. Few scattered benign hemangiomata noted. No worrisome osseous lesions. Degenerative reactive endplate changes present about the L5-S1 interspace. No abnormal marrow edema.   Conus medullaris and cauda equina: Conus extends to the L1 level. Conus and cauda equina appear normal.   Paraspinal and other soft tissues: Chronic postoperative scarring present within the lower posterior paraspinous soft tissues. No acute finding. Visualized visceral structures within normal limits.   Disc levels:   L1-2:  Unremarkable.   L2-3:  Unremarkable.   L3-4: Normal interspace. Mild bilateral facet hypertrophy. No spinal stenosis. Foramina remain patent.   L4-5: Degenerative intervertebral disc space narrowing with disc desiccation. Postoperative changes from prior left-sided laminectomy with micro discectomy. There is a superimposed right subarticular disc protrusion with annular fissure at this level (series 8, image 24). Protruding disc closely approximates the descending right L5 nerve root. Superimposed bilateral facet hypertrophy. Resultant mild to moderate right lateral recess stenosis. Central canal remains patent. No significant foraminal stenosis.   L5-S1: Degenerative vertebral disc space narrowing with disc desiccation and diffuse disc bulge. Associated reactive endplate spurring. Postoperative changes from prior left hemi laminectomy with micro discectomy. Superimposed small central disc protrusion closely approximates and/or contacts the descending S1 nerve roots (series 9,  image 29). Mild facet hypertrophy. Residual mild narrowing of the left lateral recess. Central canal remains patent. No significant foraminal stenosis.   IMPRESSION: 1. Postoperative changes from prior left posterior decompression with micro discectomy at L4-5 and L5-S1. 2. Right subarticular disc protrusion at L4-5, potentially affecting the descending right L5 nerve root. 3. Small residual and/or recurrent central disc protrusion at L5-S1, closely approximating and potentially affecting either of the descending S1 nerve roots.     Electronically Signed   By: Rise Mu M.D.   On: 11/15/2022 17:00  Imaging: No results found.   PMFS History: Patient Active Problem List   Diagnosis Date Noted   Radiculopathy, lumbar region, right L5 01/01/2023   Herniated nucleus pulposus, L4-5 right 01/01/2023   Pain of upper abdomen 01/01/2021   Gastroesophageal reflux disease 01/01/2021   Bacterial vaginosis 10/05/2020   Vaginal dysplasia 10/05/2020   Past Medical History:  Diagnosis Date   Cancer (HCC)    Vaginal   Headache    otc med prn   Heartburn    History of cervical dysplasia    History of uterine fibroid    Lipoma    Vaginal dysplasia     Family History  Problem Relation Age  of Onset   Breast cancer Mother    Pancreatic cancer Maternal Grandfather        early 3s   Colon cancer Paternal Grandfather        mid 12s    Past Surgical History:  Procedure Laterality Date   BIOPSY  02/09/2021   Procedure: BIOPSY;  Surgeon: Lanelle Bal, DO;  Location: [facility];  Service: Endoscopy;;   CO2 LASER APPLICATION N/A 10/26/2020   Procedure: CO2 LASER APPLICATION OF VAGINA;  Surgeon: Carver Fila, MD;  Location: [facility];  Service: Gynecology;  Laterality: N/A;   COLONOSCOPY WITH PROPOFOL N/A 02/09/2021   Procedure: COLONOSCOPY WITH PROPOFOL;  Surgeon: Lanelle Bal, DO;  Location: [facility];  Service: Endoscopy;  Laterality:  N/A;  10:00am   DILATION AND CURETTAGE OF UTERUS  08/05/2009  @[facility]    missed abortion   ESOPHAGOGASTRODUODENOSCOPY (EGD) WITH PROPOFOL N/A 02/09/2021   Procedure: ESOPHAGOGASTRODUODENOSCOPY (EGD) WITH PROPOFOL;  Surgeon: Lanelle Bal, DO;  Location: [facility];  Service: Endoscopy;  Laterality: N/A;   LAPAROSCOPIC SALPINGO OOPHERECTOMY Right 08-28-2018  @[facility]    ovarian torsion   LAPAROSCOPIC TUBAL LIGATION W/ FILCHIE CLIPS Bilateral 03-20-2010  @[facility]    LUMBAR DISC SURGERY  01-30-2007  @[facility]    L4-- L5   ROBOTIC ASSISTED BILATERAL SALPINGO OOPHERECTOMY Bilateral 11/16/2014   Procedure: ROBOTIC ASSISTED BILATERAL SALPINGECTOMY;  Surgeon: Carrington Clamp, MD;  Location: [facility];  Service: Gynecology;  Laterality: Bilateral;   ROBOTIC ASSISTED TOTAL HYSTERECTOMY N/A 11/16/2014   w/ BS   VULVA Ples Specter BIOPSY N/A 10/26/2020   Procedure: VAGINAL BIOPSY;  Surgeon: Carver Fila, MD;  Location: [facility];  Service: Gynecology;  Laterality: N/A;   WISDOM TOOTH EXTRACTION  1997   Social History   Occupational History   Not on file  Tobacco Use   Smoking status: Never   Smokeless tobacco: Never  Vaping Use   Vaping status: Never Used  Substance and Sexual Activity   Alcohol use: Yes    Comment: occasional wine   Drug use: Never   Sexual activity: Not Currently    Birth control/protection: Surgical    Comment: hyst

## 2023-03-28 NOTE — Progress Notes (Signed)
Surgical Instructions   Your procedure is scheduled on Monday, 04/07/23. Report to [facility] Main Entrance "A" at 12:30 P.M., then check in with the Admitting office. Any questions or running late day of surgery: call [phone]  Questions prior to your surgery date: call [phone], Monday-Friday, 8am-4pm. If you experience any cold or flu symptoms such as cough, fever, chills, shortness of breath, etc. between now and your scheduled surgery, please notify us at the above number.     Remember:  Do not eat after midnight the night before your surgery  You may drink clear liquids until 11:30am the morning of your surgery.   Clear liquids allowed are: Water, Non-Citrus Juices (without pulp), Carbonated Beverages, Clear Tea, Black Coffee Only (NO MILK, CREAM OR POWDERED CREAMER of any kind), and Gatorade.  Patient Instructions  The night before surgery:  No food after midnight. ONLY clear liquids after midnight  The day of surgery (if you do NOT have diabetes):  Drink ONE (1) Pre-Surgery Clear Ensure by 11:30am the morning of surgery. Drink in one sitting. Do not sip.  This drink was given to you during your hospital  pre-op appointment visit. Nothing else to drink after completing the  Pre-Surgery Clear Ensure.           If you have questions, please contact your surgeon's office.     Take these medicines the morning of surgery with A SIP OF WATER  omeprazole (PRILOSEC)   May take these medicines IF NEEDED: ALPRAZolam (XANAX)  Gabapentin  HYDROmorphone (DILAUDID)   One week prior to surgery, STOP taking any Aspirin (unless otherwise instructed by your surgeon) Aleve, Naproxen, Ibuprofen, Motrin, Advil, Goody's, BC's, all herbal medications, fish oil, and non-prescription vitamins.  Please stop taking phentermine (ADIPEX-P) 2 weeks prior to surgery.                      Do NOT Smoke (Tobacco/Vaping) for 24 hours prior to your procedure.  If you use a CPAP at night,  you may bring your mask/headgear for your overnight stay.   You will be asked to remove any contacts, glasses, piercing's, hearing aid's, dentures/partials prior to surgery. Please bring cases for these items if needed.    Patients discharged the day of surgery will not be allowed to drive home, and someone needs to stay with them for 24 hours.  SURGICAL WAITING ROOM VISITATION Patients may have no more than 2 support people in the waiting area - these visitors may rotate.   Pre-op nurse will coordinate an appropriate time for 1 ADULT support person, who may not rotate, to accompany patient in pre-op.  Children under the age of 52 must have an adult with them who is not the patient and must remain in the main waiting area with an adult.  If the patient needs to stay at the hospital during part of their recovery, the visitor guidelines for inpatient rooms apply.  Please refer to the [facility] website for the visitor guidelines for any additional information.   If you received a COVID test during your pre-op visit  it is requested that you wear a mask when out in public, stay away from anyone that may not be feeling well and notify your surgeon if you develop symptoms. If you have been in contact with anyone that has tested positive in the last 10 days please notify you surgeon.      Pre-operative 5 CHG Bathing Instructions   You can  play a key role in reducing the risk of infection after surgery. Your skin needs to be as free of germs as possible. You can reduce the number of germs on your skin by washing with CHG (chlorhexidine gluconate) soap before surgery. CHG is an antiseptic soap that kills germs and continues to kill germs even after washing.   DO NOT use if you have an allergy to chlorhexidine/CHG or antibacterial soaps. If your skin becomes reddened or irritated, stop using the CHG and notify one of our RNs at [phone].   Please shower with the CHG soap starting 4 days before  surgery using the following schedule:     Please keep in mind the following:  DO NOT shave, including legs and underarms, starting the day of your first shower.   You may shave your face at any point before/day of surgery.  Place clean sheets on your bed the day you start using CHG soap. Use a clean washcloth (not used since being washed) for each shower. DO NOT sleep with pets once you start using the CHG.   CHG Shower Instructions:  Wash your face and private area with normal soap. If you choose to wash your hair, wash first with your normal shampoo.  After you use shampoo/soap, rinse your hair and body thoroughly to remove shampoo/soap residue.  Turn the water OFF and apply about 3 tablespoons (45 ml) of CHG soap to a CLEAN washcloth.  Apply CHG soap ONLY FROM YOUR NECK DOWN TO YOUR TOES (washing for 3-5 minutes)  DO NOT use CHG soap on face, private areas, open wounds, or sores.  Pay special attention to the area where your surgery is being performed.  If you are having back surgery, having someone wash your back for you may be helpful. Wait 2 minutes after CHG soap is applied, then you may rinse off the CHG soap.  Pat dry with a clean towel  Put on clean clothes/pajamas   If you choose to wear lotion, please use ONLY the CHG-compatible lotions on the back of this paper.   Additional instructions for the day of surgery: DO NOT APPLY any lotions, deodorants, cologne, or perfumes.   Do not bring valuables to the hospital. [facility] is not responsible for any belongings/valuables. Do not wear nail polish, gel polish, artificial nails, or any other type of covering on natural nails (fingers and toes) Do not wear jewelry or makeup Put on clean/comfortable clothes.  Please brush your teeth.  Ask your nurse before applying any prescription medications to the skin.     CHG Compatible Lotions   Aveeno Moisturizing lotion  Cetaphil Moisturizing Cream  Cetaphil Moisturizing  Lotion  Clairol Herbal Essence Moisturizing Lotion, Dry Skin  Clairol Herbal Essence Moisturizing Lotion, Extra Dry Skin  Clairol Herbal Essence Moisturizing Lotion, Normal Skin  Curel Age Defying Therapeutic Moisturizing Lotion with Alpha Hydroxy  Curel Extreme Care Body Lotion  Curel Soothing Hands Moisturizing Hand Lotion  Curel Therapeutic Moisturizing Cream, Fragrance-Free  Curel Therapeutic Moisturizing Lotion, Fragrance-Free  Curel Therapeutic Moisturizing Lotion, Original Formula  Eucerin Daily Replenishing Lotion  Eucerin Dry Skin Therapy Plus Alpha Hydroxy Crme  Eucerin Dry Skin Therapy Plus Alpha Hydroxy Lotion  Eucerin Original Crme  Eucerin Original Lotion  Eucerin Plus Crme Eucerin Plus Lotion  Eucerin TriLipid Replenishing Lotion  Keri Anti-Bacterial Hand Lotion  Keri Deep Conditioning Original Lotion Dry Skin Formula Softly Scented  Keri Deep Conditioning Original Lotion, Fragrance Free Sensitive Skin Formula  Keri Lotion Fast  Absorbing Fragrance Free Sensitive Skin Formula  Keri Lotion Fast Absorbing Softly Scented Dry Skin Formula  Keri Original Lotion  Keri Skin Renewal Lotion Keri Silky Smooth Lotion  Keri Silky Smooth Sensitive Skin Lotion  Nivea Body Creamy Conditioning Oil  Nivea Body Extra Enriched Lotion  Nivea Body Original Lotion  Nivea Body Sheer Moisturizing Lotion Nivea Crme  Nivea Skin Firming Lotion  NutraDerm 30 Skin Lotion  NutraDerm Skin Lotion  NutraDerm Therapeutic Skin Cream  NutraDerm Therapeutic Skin Lotion  ProShield Protective Hand Cream  Provon moisturizing lotion  Please read over the following fact sheets that you were given.

## 2023-03-31 ENCOUNTER — Encounter (HOSPITAL_COMMUNITY): Payer: Self-pay

## 2023-03-31 ENCOUNTER — Other Ambulatory Visit: Payer: Self-pay

## 2023-03-31 ENCOUNTER — Encounter (HOSPITAL_COMMUNITY)
Admission: RE | Admit: 2023-03-31 | Discharge: 2023-03-31 | Disposition: A | Discharge status: Home / Self Care | Payer: BC Managed Care – PPO | Source: Ambulatory Visit | Attending: Orthopaedic Surgery | Admitting: Orthopaedic Surgery

## 2023-03-31 VITALS — BP 128/84 | HR 95 | Temp 98.2°F | Resp 17 | Ht 63.0 in | Wt 181.0 lb

## 2023-03-31 DIAGNOSIS — Z01812 Encounter for preprocedural laboratory examination: Secondary | ICD-10-CM | POA: Insufficient documentation

## 2023-03-31 DIAGNOSIS — Z01818 Encounter for other preprocedural examination: Secondary | ICD-10-CM | POA: Diagnosis present

## 2023-03-31 HISTORY — DX: Gastro-esophageal reflux disease without esophagitis: K21.9

## 2023-03-31 LAB — CBC
HCT: 42.7 % (ref 36.0–46.0)
Hemoglobin: 14.6 g/dL (ref 12.0–15.0)
MCH: 31.1 pg (ref 26.0–34.0)
MCHC: 34.2 g/dL (ref 30.0–36.0)
MCV: 90.9 fL (ref 80.0–100.0)
Platelets: 301 10*3/uL (ref 150–400)
RBC: 4.7 MIL/uL (ref 3.87–5.11)
RDW: 11.9 % (ref 11.5–15.5)
WBC: 9.3 10*3/uL (ref 4.0–10.5)
nRBC: 0 % (ref 0.0–0.2)

## 2023-03-31 LAB — SURGICAL PCR SCREEN
MRSA, PCR: NEGATIVE
Staphylococcus aureus: NEGATIVE

## 2023-03-31 NOTE — Progress Notes (Addendum)
PCP - [name] Cardiologist -   PPM/ICD - denies Device Orders - n/a Rep Notified - n/a  Chest x-ray - denies EKG - denies Stress Test - denies ECHO - denies Cardiac Cath - denies  Sleep Study - denies   DM- denies  Blood Thinner Instructions: denies Aspirin Instructions: n/a  ERAS Protcol - PRE-SURGERY Ensure   COVID TEST- n/a   Anesthesia review: no  Patient denies shortness of breath, fever, cough and chest pain at PAT appointment   All instructions explained to the patient, with a verbal understanding of the material. Patient agrees to go over the instructions while at home for a better understanding. Patient also instructed to self quarantine after being tested for COVID-19. The opportunity to ask questions was provided.

## 2023-04-04 NOTE — Progress Notes (Signed)
LM on pt's VM about surgery time change.  I was able to reach pt's husband and he is aware pt needs to arrive at 1030 and ERAS until 0930.

## 2023-04-07 ENCOUNTER — Ambulatory Visit (HOSPITAL_COMMUNITY): Payer: BC Managed Care – PPO

## 2023-04-07 ENCOUNTER — Ambulatory Visit (HOSPITAL_COMMUNITY): Payer: BC Managed Care – PPO | Admitting: Anesthesiology

## 2023-04-07 ENCOUNTER — Observation Stay (HOSPITAL_COMMUNITY)
Admission: RE | Admit: 2023-04-07 | Discharge: 2023-04-08 | Disposition: A | Discharge status: Home / Self Care | Payer: BC Managed Care – PPO | Attending: Orthopaedic Surgery | Admitting: Orthopaedic Surgery

## 2023-04-07 ENCOUNTER — Encounter (HOSPITAL_COMMUNITY): Payer: Self-pay | Admitting: Orthopaedic Surgery

## 2023-04-07 ENCOUNTER — Other Ambulatory Visit: Payer: Self-pay

## 2023-04-07 ENCOUNTER — Encounter (HOSPITAL_COMMUNITY)
Admission: RE | Disposition: A | Discharge status: Home / Self Care | Payer: Self-pay | Source: Home / Self Care | Attending: Orthopaedic Surgery

## 2023-04-07 DIAGNOSIS — M5116 Intervertebral disc disorders with radiculopathy, lumbar region: Principal | ICD-10-CM | POA: Insufficient documentation

## 2023-04-07 DIAGNOSIS — Z9889 Other specified postprocedural states: Principal | ICD-10-CM

## 2023-04-07 DIAGNOSIS — M5126 Other intervertebral disc displacement, lumbar region: Secondary | ICD-10-CM | POA: Diagnosis present

## 2023-04-07 DIAGNOSIS — Z8544 Personal history of malignant neoplasm of other female genital organs: Secondary | ICD-10-CM | POA: Diagnosis not present

## 2023-04-07 HISTORY — PX: LUMBAR LAMINECTOMY: SHX95

## 2023-04-07 SURGERY — MICRODISCECTOMY LUMBAR LAMINECTOMY
Anesthesia: General | Site: Spine Lumbar

## 2023-04-07 MED ORDER — MIDAZOLAM HCL 2 MG/2ML IJ SOLN
INTRAMUSCULAR | Status: AC
  Filled 2023-04-07: qty 2

## 2023-04-07 MED ORDER — HYDROCODONE-ACETAMINOPHEN 10-325 MG PO TABS: 2.0000 | ORAL_TABLET | ORAL | Status: DC | PRN

## 2023-04-07 MED ORDER — FENTANYL CITRATE (PF) 100 MCG/2ML IJ SOLN
INTRAMUSCULAR | Status: AC
  Filled 2023-04-07: qty 2

## 2023-04-07 MED ORDER — BUPIVACAINE HCL (PF) 0.25 % IJ SOLN
INTRAMUSCULAR | Status: DC | PRN
  Administered 2023-04-07: 10 mL

## 2023-04-07 MED ORDER — SODIUM CHLORIDE 0.9 % IV SOLN
250.0000 mL | INTRAVENOUS | Status: DC
  Administered 2023-04-07: 250 mL via INTRAVENOUS

## 2023-04-07 MED ORDER — ONDANSETRON HCL 4 MG/2ML IJ SOLN
INTRAMUSCULAR | Status: DC | PRN
  Administered 2023-04-07: 4 mg via INTRAVENOUS

## 2023-04-07 MED ORDER — CEFAZOLIN SODIUM-DEXTROSE 2-4 GM/100ML-% IV SOLN
2.0000 g | INTRAVENOUS | Status: AC
  Administered 2023-04-07: 2 g via INTRAVENOUS

## 2023-04-07 MED ORDER — ONDANSETRON HCL 4 MG PO TABS: 4.0000 mg | ORAL_TABLET | Freq: Four times a day (QID) | ORAL | Status: DC | PRN

## 2023-04-07 MED ORDER — MIDAZOLAM HCL 2 MG/2ML IJ SOLN
INTRAMUSCULAR | Status: DC | PRN
  Administered 2023-04-07: 2 mg via INTRAVENOUS

## 2023-04-07 MED ORDER — METHOCARBAMOL 500 MG PO TABS
500.0000 mg | ORAL_TABLET | Freq: Four times a day (QID) | ORAL | Status: DC | PRN
  Administered 2023-04-07 – 2023-04-08 (×2): 500 mg via ORAL
  Filled 2023-04-07 (×2): qty 1

## 2023-04-07 MED ORDER — SODIUM CHLORIDE 0.45 % IV SOLN: INTRAVENOUS | Status: DC

## 2023-04-07 MED ORDER — ACETAMINOPHEN 10 MG/ML IV SOLN
1000.0000 mg | Freq: Once | INTRAVENOUS | Status: DC | PRN
  Administered 2023-04-07: 1000 mg via INTRAVENOUS

## 2023-04-07 MED ORDER — SODIUM CHLORIDE 0.9% FLUSH: 3.0000 mL | INTRAVENOUS | Status: DC | PRN

## 2023-04-07 MED ORDER — LACTATED RINGERS IV SOLN: INTRAVENOUS | Status: DC

## 2023-04-07 MED ORDER — ROCURONIUM BROMIDE 10 MG/ML (PF) SYRINGE
PREFILLED_SYRINGE | INTRAVENOUS | Status: DC | PRN
  Administered 2023-04-07: 60 mg via INTRAVENOUS

## 2023-04-07 MED ORDER — FENTANYL CITRATE (PF) 100 MCG/2ML IJ SOLN
25.0000 ug | INTRAMUSCULAR | Status: DC | PRN
  Administered 2023-04-07 (×3): 50 ug via INTRAVENOUS

## 2023-04-07 MED ORDER — CEFAZOLIN SODIUM-DEXTROSE 2-4 GM/100ML-% IV SOLN
INTRAVENOUS | Status: AC
  Filled 2023-04-07: qty 100

## 2023-04-07 MED ORDER — SODIUM CHLORIDE 0.9% FLUSH: 3.0000 mL | Freq: Two times a day (BID) | INTRAVENOUS | Status: DC

## 2023-04-07 MED ORDER — CHLORHEXIDINE GLUCONATE 0.12 % MT SOLN
OROMUCOSAL | Status: AC
  Administered 2023-04-07: 15 mL via OROMUCOSAL
  Filled 2023-04-07: qty 15

## 2023-04-07 MED ORDER — METHOCARBAMOL 1000 MG/10ML IJ SOLN: 500.0000 mg | Freq: Four times a day (QID) | INTRAVENOUS | Status: DC | PRN

## 2023-04-07 MED ORDER — ACETAMINOPHEN 10 MG/ML IV SOLN
INTRAVENOUS | Status: AC
  Filled 2023-04-07: qty 100

## 2023-04-07 MED ORDER — FENTANYL CITRATE (PF) 250 MCG/5ML IJ SOLN
INTRAMUSCULAR | Status: DC | PRN
  Administered 2023-04-07 (×3): 50 ug via INTRAVENOUS

## 2023-04-07 MED ORDER — PROPOFOL 10 MG/ML IV BOLUS
INTRAVENOUS | Status: AC
  Filled 2023-04-07: qty 20

## 2023-04-07 MED ORDER — PANTOPRAZOLE SODIUM 40 MG PO TBEC
80.0000 mg | DELAYED_RELEASE_TABLET | Freq: Every day | ORAL | Status: DC
  Administered 2023-04-08: 80 mg via ORAL
  Filled 2023-04-07: qty 2

## 2023-04-07 MED ORDER — ORAL CARE MOUTH RINSE: 15.0000 mL | Freq: Once | OROMUCOSAL | Status: AC

## 2023-04-07 MED ORDER — PHENTERMINE HCL 37.5 MG PO TABS: 37.5000 mg | ORAL_TABLET | Freq: Every day | ORAL | Status: DC

## 2023-04-07 MED ORDER — ONDANSETRON HCL 4 MG/2ML IJ SOLN: 4.0000 mg | Freq: Four times a day (QID) | INTRAMUSCULAR | Status: DC | PRN

## 2023-04-07 MED ORDER — PROPOFOL 10 MG/ML IV BOLUS
INTRAVENOUS | Status: DC | PRN
  Administered 2023-04-07: 140 mg via INTRAVENOUS

## 2023-04-07 MED ORDER — ALPRAZOLAM 0.5 MG PO TABS
0.5000 mg | ORAL_TABLET | Freq: Every evening | ORAL | Status: DC | PRN
  Administered 2023-04-08: 0.5 mg via ORAL
  Filled 2023-04-07: qty 1

## 2023-04-07 MED ORDER — CELECOXIB 200 MG PO CAPS
200.0000 mg | ORAL_CAPSULE | Freq: Two times a day (BID) | ORAL | Status: DC
  Administered 2023-04-07 – 2023-04-08 (×2): 200 mg via ORAL
  Filled 2023-04-07 (×2): qty 1

## 2023-04-07 MED ORDER — ACETAMINOPHEN 160 MG/5ML PO SOLN: 1000.0000 mg | Freq: Once | ORAL | Status: DC | PRN

## 2023-04-07 MED ORDER — FENTANYL CITRATE (PF) 250 MCG/5ML IJ SOLN
INTRAMUSCULAR | Status: AC
  Filled 2023-04-07: qty 5

## 2023-04-07 MED ORDER — MENTHOL 3 MG MT LOZG: 1.0000 | LOZENGE | OROMUCOSAL | Status: DC | PRN

## 2023-04-07 MED ORDER — CHLORHEXIDINE GLUCONATE 0.12 % MT SOLN: 15.0000 mL | Freq: Once | OROMUCOSAL | Status: AC

## 2023-04-07 MED ORDER — BUPIVACAINE HCL (PF) 0.25 % IJ SOLN
INTRAMUSCULAR | Status: AC
  Filled 2023-04-07: qty 30

## 2023-04-07 MED ORDER — PHENOL 1.4 % MT LIQD: 1.0000 | OROMUCOSAL | Status: DC | PRN

## 2023-04-07 MED ORDER — ACETAMINOPHEN 500 MG PO TABS: 1000.0000 mg | ORAL_TABLET | Freq: Once | ORAL | Status: DC | PRN

## 2023-04-07 MED ORDER — DEXAMETHASONE SODIUM PHOSPHATE 10 MG/ML IJ SOLN
INTRAMUSCULAR | Status: DC | PRN
  Administered 2023-04-07: 10 mg via INTRAVENOUS

## 2023-04-07 MED ORDER — 0.9 % SODIUM CHLORIDE (POUR BTL) OPTIME
TOPICAL | Status: DC | PRN
Start: 2023-04-07 — End: 2023-04-07
  Administered 2023-04-07: 1000 mL

## 2023-04-07 MED ORDER — ESTRADIOL 0.1 MG/GM VA CREA: 1.0000 g | TOPICAL_CREAM | VAGINAL | Status: DC

## 2023-04-07 MED ORDER — SUGAMMADEX SODIUM 200 MG/2ML IV SOLN
INTRAVENOUS | Status: DC | PRN
  Administered 2023-04-07 (×2): 200 mg via INTRAVENOUS

## 2023-04-07 MED ORDER — HYDROCODONE-ACETAMINOPHEN 7.5-325 MG PO TABS
1.0000 | ORAL_TABLET | ORAL | Status: DC | PRN
  Administered 2023-04-07 – 2023-04-08 (×4): 1 via ORAL
  Filled 2023-04-07 (×4): qty 1

## 2023-04-07 MED ORDER — LIDOCAINE 2% (20 MG/ML) 5 ML SYRINGE
INTRAMUSCULAR | Status: DC | PRN
  Administered 2023-04-07: 60 mg via INTRAVENOUS

## 2023-04-07 SURGICAL SUPPLY — 44 items
ADH SKN CLS APL DERMABOND .7 (GAUZE/BANDAGES/DRESSINGS)
BAG COUNTER SPONGE SURGICOUNT (BAG) ×1 IMPLANT
BAG SPNG CNTER NS LX DISP (BAG) ×1
BUR ROUND FLUTED 4 SOFT TCH (BURR) IMPLANT
CANISTER SUCT 3000ML PPV (MISCELLANEOUS) ×1 IMPLANT
CLSR STERI-STRIP ANTIMIC 1/2X4 (GAUZE/BANDAGES/DRESSINGS) IMPLANT
COVER SURGICAL LIGHT HANDLE (MISCELLANEOUS) ×1 IMPLANT
DERMABOND ADVANCED .7 DNX12 (GAUZE/BANDAGES/DRESSINGS) ×1 IMPLANT
DRAPE MICROSCOPE SLANT 54X150 (MISCELLANEOUS) ×1 IMPLANT
DRESSING MEPILEX FLEX 4X4 (GAUZE/BANDAGES/DRESSINGS) IMPLANT
DRSG MEPILEX FLEX 4X4 (GAUZE/BANDAGES/DRESSINGS)
DRSG MEPILEX POST OP 4X8 (GAUZE/BANDAGES/DRESSINGS) IMPLANT
DURAPREP 26ML APPLICATOR (WOUND CARE) ×1 IMPLANT
DURASEAL SPINE SEALANT 3ML (MISCELLANEOUS) IMPLANT
ELECT REM PT RETURN 9FT ADLT (ELECTROSURGICAL) ×1
ELECTRODE REM PT RTRN 9FT ADLT (ELECTROSURGICAL) ×1 IMPLANT
GAUZE SPONGE 4X4 12PLY STRL (GAUZE/BANDAGES/DRESSINGS) ×1 IMPLANT
GLOVE BIOGEL PI IND STRL 8 (GLOVE) ×2 IMPLANT
GLOVE ORTHO TXT STRL SZ7.5 (GLOVE) ×2 IMPLANT
GOWN STRL REUS W/ TWL LRG LVL3 (GOWN DISPOSABLE) ×1 IMPLANT
GOWN STRL REUS W/ TWL XL LVL3 (GOWN DISPOSABLE) ×1 IMPLANT
GOWN STRL REUS W/TWL 2XL LVL3 (GOWN DISPOSABLE) ×1 IMPLANT
GOWN STRL REUS W/TWL LRG LVL3 (GOWN DISPOSABLE) ×1
GOWN STRL REUS W/TWL XL LVL3 (GOWN DISPOSABLE) ×1
KIT BASIN OR (CUSTOM PROCEDURE TRAY) ×1 IMPLANT
KIT TURNOVER KIT B (KITS) ×1 IMPLANT
NDL SPNL 18GX3.5 QUINCKE PK (NEEDLE) ×1 IMPLANT
NEEDLE SPNL 18GX3.5 QUINCKE PK (NEEDLE) ×1 IMPLANT
NS IRRIG 1000ML POUR BTL (IV SOLUTION) ×1 IMPLANT
PACK LAMINECTOMY ORTHO (CUSTOM PROCEDURE TRAY) ×1 IMPLANT
PAD ARMBOARD 7.5X6 YLW CONV (MISCELLANEOUS) ×2 IMPLANT
PATTIES SURGICAL .5 X.5 (GAUZE/BANDAGES/DRESSINGS) IMPLANT
PATTIES SURGICAL .75X.75 (GAUZE/BANDAGES/DRESSINGS) IMPLANT
SUT VIC AB 1 CT1 27 (SUTURE) ×1
SUT VIC AB 1 CT1 27XBRD ANTBC (SUTURE) IMPLANT
SUT VIC AB 1 CTX 36 (SUTURE) ×1
SUT VIC AB 1 CTX36XBRD ANBCTR (SUTURE) ×1 IMPLANT
SUT VIC AB 2-0 CT1 27 (SUTURE) ×1
SUT VIC AB 2-0 CT1 TAPERPNT 27 (SUTURE) ×1 IMPLANT
SUT VIC AB 3-0 X1 27 (SUTURE) IMPLANT
SYR 20ML ECCENTRIC (SYRINGE) IMPLANT
TOWEL GREEN STERILE (TOWEL DISPOSABLE) ×1 IMPLANT
TOWEL GREEN STERILE FF (TOWEL DISPOSABLE) ×1 IMPLANT
WATER STERILE IRR 1000ML POUR (IV SOLUTION) ×1 IMPLANT

## 2023-04-07 NOTE — Transfer of Care (Signed)
Immediate Anesthesia Transfer of Care Note  Patient: Maria Velasquez  Procedure(s) Performed: RIGHT LUMBAR 4-5 MICRODISCECTOMY (Spine Lumbar)  Patient Location: PACU  Anesthesia Type:General  Level of Consciousness: awake, oriented, and drowsy  Airway & Oxygen Therapy: Patient Spontanous Breathing  Post-op Assessment: Report given to RN, Post -op Vital signs reviewed and stable, and Patient moving all extremities  Post vital signs: Reviewed and stable  Last Vitals:  Vitals Value Taken Time  BP 81/67 04/07/23 1420  Temp    Pulse 94 04/07/23 1421  Resp 16 04/07/23 1421  SpO2 96 % 04/07/23 1421  Vitals shown include unfiled device data.  Last Pain:  Vitals:   04/07/23 1043  TempSrc:   PainSc: 7       Patients Stated Pain Goal: 0 (04/07/23 1043)  Complications: No notable events documented.

## 2023-04-07 NOTE — Interval H&P Note (Signed)
History and Physical Interval Note:  04/07/2023 12:21 PM  Maria Velasquez  has presented today for surgery, with the diagnosis of right L4-5 herniated nucleus pulposus.  The various methods of treatment have been discussed with the patient and family. After consideration of risks, benefits and other options for treatment, the patient has consented to  Procedure(s) with comments: RIGHT L4-5 MICRODISCECTOMY (N/A) - Needs RNFA as a surgical intervention.  The patient's history has been reviewed, patient examined, no change in status, stable for surgery.  I have reviewed the patient's chart and labs.  Questions were answered to the patient's satisfaction.     [name]

## 2023-04-07 NOTE — H&P (Signed)
Patient: Glori Luis                                      Date of Birth: Jun 18, 1976                                                    MRN: [id number] Visit Date: 03/25/2023                                                                     Requested by: [name], PA-C [address] [place],  [place] [address] PCP: [name], PA-C     Assessment & Plan: Visit Diagnoses:  1. Herniated nucleus pulposus, L4-5 right       Plan: Patient with annular tear disc protrusion L5 radiculopathy with the EHL weakness symptomatic failed conservative treatment including epidurals symptoms greater than 6 months.  Plan repeat microdiscectomy L4-5 using the old scar.  Procedure discussed risks of dural tear, dural repair, repeat rupture possible progression of disc degeneration and and need for spine fusion at some point also discussed.  Previous surgery by me 16 years ago did well with microdiscectomy I expect that this should get her good relief of her severe radicular leg pain.  Questions elicited and answered she understands and request to proceed.   Follow-Up Instructions: No follow-ups on file.    Orders:  No orders of the defined types were placed in this encounter.   No orders of the defined types were placed in this encounter.        Procedures: No procedures performed     Clinical Data: No additional findings.     Subjective:    Chief Complaint  Patient presents with   Lower Back - Pain      HPI 47 year old female with past history of 2008 microdiscectomy L4-5 L5-S1 returns with recurrent back pain right leg pain and right L4-5 disc protrusion with annular fissure and right L5 radiculopathy.  She is been seen by Dr. [facility] for epidural injections which just gave her temporary relief.  She has been through chiropractic treatments dry needling, home therapy exercise program, anti-inflammatories, Tylenol, prednisone Dosepak, epidural steroid injection without relief.   Left epidural 12/04/2022 followed by 01/01/2023.  Patient returns to me I did her surgery back in 2008 and patient states that she wants to proceed with surgery.  Nothing is helping this pain.  Bothers her on a daily basis.  She has been getting ongoing treatment for greater than 6 months without relief.  She has pain with sitting standing standing.  No neurogenic claudication symptoms.  Pain radiates down right buttocks into her right thigh posterior lateral calf and the dorsum of her foot.  She has noticed catching of her foot at times on the right.   Review of Systems patient's had some GE reflux not currently bothering her.  Chronic back and right leg pain previous microdiscectomy surgery L4-5.  No bowel or bladder symptoms.  No fever or chills.  Objective: Vital Signs: BP 125/84   Pulse 94   LMP 10/19/2014    Physical Exam Constitutional:      Appearance: She is well-developed.  HENT:     Head: Normocephalic.     Right Ear: External ear normal.     Left Ear: External ear normal. There is no impacted cerumen.  Eyes:     Pupils: Pupils are equal, round, and reactive to light.  Neck:     Thyroid: No thyromegaly.     Trachea: No tracheal deviation.  Cardiovascular:     Rate and Rhythm: Normal rate.  Pulmonary:     Effort: Pulmonary effort is normal.  Abdominal:     Palpations: Abdomen is soft.  Musculoskeletal:     Cervical back: No rigidity.  Skin:    General: Skin is warm and dry.  Neurological:     Mental Status: She is alert and oriented to person, place, and time.  Psychiatric:        Behavior: Behavior normal.        Ortho Exam well-healed L4-5 incision.  Positive sciatic notch tenderness on the right positive straight leg raising 70 degrees.  Patient has right EHL weakness no weakness on the left anterior tib is trace weak.  Gastrocsoleus is strong both right and left peroneals are strong no posterior tib weakness.   Specialty Comments:  MRI LUMBAR SPINE WITHOUT  CONTRAST   TECHNIQUE: Multiplanar, multisequence MR imaging of the lumbar spine was performed. No intravenous contrast was administered.   COMPARISON:  Prior radiograph from 10/31/2022 as well as MRI from 12/03/2008.   FINDINGS: Segmentation: Standard. Lowest well-formed disc space labeled the L5-S1 level.   Alignment: Physiologic with preservation of the normal lumbar lordosis. No listhesis.   Vertebrae: Vertebral body height maintained without acute or chronic fracture. Bone marrow signal intensity within normal limits. Few scattered benign hemangiomata noted. No worrisome osseous lesions. Degenerative reactive endplate changes present about the L5-S1 interspace. No abnormal marrow edema.   Conus medullaris and cauda equina: Conus extends to the L1 level. Conus and cauda equina appear normal.   Paraspinal and other soft tissues: Chronic postoperative scarring present within the lower posterior paraspinous soft tissues. No acute finding. Visualized visceral structures within normal limits.   Disc levels:   L1-2:  Unremarkable.   L2-3:  Unremarkable.   L3-4: Normal interspace. Mild bilateral facet hypertrophy. No spinal stenosis. Foramina remain patent.   L4-5: Degenerative intervertebral disc space narrowing with disc desiccation. Postoperative changes from prior left-sided laminectomy with micro discectomy. There is a superimposed right subarticular disc protrusion with annular fissure at this level (series 8, image 24). Protruding disc closely approximates the descending right L5 nerve root. Superimposed bilateral facet hypertrophy. Resultant mild to moderate right lateral recess stenosis. Central canal remains patent. No significant foraminal stenosis.   L5-S1: Degenerative vertebral disc space narrowing with disc desiccation and diffuse disc bulge. Associated reactive endplate spurring. Postoperative changes from prior left hemi laminectomy with micro  discectomy. Superimposed small central disc protrusion closely approximates and/or contacts the descending S1 nerve roots (series 9, image 29). Mild facet hypertrophy. Residual mild narrowing of the left lateral recess. Central canal remains patent. No significant foraminal stenosis.   IMPRESSION: 1. Postoperative changes from prior left posterior decompression with micro discectomy at L4-5 and L5-S1. 2. Right subarticular disc protrusion at L4-5, potentially affecting the descending right L5 nerve root. 3. Small residual and/or recurrent central disc protrusion at L5-S1, closely approximating and potentially affecting either  of the descending S1 nerve roots.     Electronically Signed   By: Rise Mu M.D.   On: 11/15/2022 17:00   Imaging: No results found.     PMFS History:     Patient Active Problem List    Diagnosis Date Noted   Radiculopathy, lumbar region, right L5 01/01/2023   Herniated nucleus pulposus, L4-5 right 01/01/2023   Pain of upper abdomen 01/01/2021   Gastroesophageal reflux disease 01/01/2021   Bacterial vaginosis 10/05/2020   Vaginal dysplasia 10/05/2020        Past Medical History:  Diagnosis Date   Cancer (HCC)      Vaginal   Headache      otc med prn   Heartburn     History of cervical dysplasia     History of uterine fibroid     Lipoma     Vaginal dysplasia               Family History  Problem Relation Age of Onset   Breast cancer Mother     Pancreatic cancer Maternal Grandfather          early 65s   Colon cancer Paternal Grandfather          mid 52s             Past Surgical History:  Procedure Laterality Date   BIOPSY   02/09/2021    Procedure: BIOPSY;  Surgeon: Lanelle Bal, DO;  Location: [facility];  Service: Endoscopy;;   CO2 LASER APPLICATION N/A 10/26/2020    Procedure: CO2 LASER APPLICATION OF VAGINA;  Surgeon: Carver Fila, MD;  Location: [facility];  Service: Gynecology;   Laterality: N/A;   COLONOSCOPY WITH PROPOFOL N/A 02/09/2021    Procedure: COLONOSCOPY WITH PROPOFOL;  Surgeon: Lanelle Bal, DO;  Location: [facility];  Service: Endoscopy;  Laterality: N/A;  10:00am   DILATION AND CURETTAGE OF UTERUS   08/05/2009  @[facility]     missed abortion   ESOPHAGOGASTRODUODENOSCOPY (EGD) WITH PROPOFOL N/A 02/09/2021    Procedure: ESOPHAGOGASTRODUODENOSCOPY (EGD) WITH PROPOFOL;  Surgeon: Lanelle Bal, DO;  Location: [facility];  Service: Endoscopy;  Laterality: N/A;   LAPAROSCOPIC SALPINGO OOPHERECTOMY Right 08-28-2018  @[facility]     ovarian torsion   LAPAROSCOPIC TUBAL LIGATION W/ FILCHIE CLIPS Bilateral 03-20-2010  @[facility]    LUMBAR DISC SURGERY   01-30-2007  @[facility]     L4-- L5   ROBOTIC ASSISTED BILATERAL SALPINGO OOPHERECTOMY Bilateral 11/16/2014    Procedure: ROBOTIC ASSISTED BILATERAL SALPINGECTOMY;  Surgeon: Carrington Clamp, MD;  Location: [facility];  Service: Gynecology;  Laterality: Bilateral;   ROBOTIC ASSISTED TOTAL HYSTERECTOMY N/A 11/16/2014    w/ BS   VULVA Ples Specter BIOPSY N/A 10/26/2020    Procedure: VAGINAL BIOPSY;  Surgeon: Carver Fila, MD;  Location: [facility];  Service: Gynecology;  Laterality: N/A;   WISDOM TOOTH EXTRACTION   1997        Social History         Occupational History   Not on file  Tobacco Use   Smoking status: Never   Smokeless tobacco: Never  Vaping Use   Vaping status: Never Used  Substance and Sexual Activity   Alcohol use: Yes      Comment: occasional wine   Drug use: Never   Sexual activity: Not Currently      Birth control/protection: Surgical      Comment: hyst

## 2023-04-07 NOTE — Anesthesia Preprocedure Evaluation (Signed)
Anesthesia Evaluation  Patient identified by MRN, date of birth, ID band Patient awake    Reviewed: Allergy & Precautions, NPO status , Patient's Chart, lab work & pertinent test results  History of Anesthesia Complications Negative for: history of anesthetic complications  Airway Mallampati: I  TM Distance: >3 FB Neck ROM: Full    Dental  (+) Teeth Intact, Dental Advisory Given   Pulmonary neg pulmonary ROS   breath sounds clear to auscultation       Cardiovascular negative cardio ROS  Rhythm:Regular     Neuro/Psych  Headaches  Neuromuscular disease    GI/Hepatic Neg liver ROS,GERD  Medicated and Controlled,,  Endo/Other  negative endocrine ROS    Renal/GU negative Renal ROS     Musculoskeletal negative musculoskeletal ROS (+)    Abdominal   Peds  Hematology negative hematology ROS (+)   Anesthesia Other Findings   Reproductive/Obstetrics                             Anesthesia Physical Anesthesia Plan  ASA: 2  Anesthesia Plan: General   Post-op Pain Management: Ofirmev IV (intra-op)* and Toradol IV (intra-op)*   Induction: Intravenous  PONV Risk Score and Plan: 3 and Ondansetron and Dexamethasone  Airway Management Planned: Oral ETT  Additional Equipment: None  Intra-op Plan:   Post-operative Plan: Extubation in OR  Informed Consent: I have reviewed the patients History and Physical, chart, labs and discussed the procedure including the risks, benefits and alternatives for the proposed anesthesia with the patient or authorized representative who has indicated his/her understanding and acceptance.     Dental advisory given  Plan Discussed with: CRNA  Anesthesia Plan Comments:        Anesthesia Quick Evaluation

## 2023-04-07 NOTE — Anesthesia Procedure Notes (Signed)
Procedure Name: Intubation Date/Time: 04/07/2023 1:02 PM  Performed by: Kayleen Memos, CRNAPre-anesthesia Checklist: Patient identified, Emergency Drugs available, Suction available and Patient being monitored Patient Re-evaluated:Patient Re-evaluated prior to induction Oxygen Delivery Method: Circle System Utilized Preoxygenation: Pre-oxygenation with 100% oxygen Induction Type: IV induction Ventilation: Mask ventilation without difficulty Laryngoscope Size: Mac and 3 Grade View: Grade I Tube type: Oral Tube size: 7.0 mm Number of attempts: 1 Airway Equipment and Method: Stylet and Oral airway Placement Confirmation: ETT inserted through vocal cords under direct vision, positive ETCO2 and breath sounds checked- equal and bilateral Secured at: 21 (at the teeth) cm Tube secured with: Tape Dental Injury: Teeth and Oropharynx as per pre-operative assessment

## 2023-04-07 NOTE — Op Note (Signed)
Pre and postop diagnosis: L4-5 recurrent disc protrusion, right.  Procedure: Right L4-5 microdiscectomy for disc protrusion and partial foot drop.  Surgeon: Annell Greening, MD  Assistant: Isabella Bowens, RNFA  EBL less than 100 cc  Findings right paracentral and caudad disc protrusion subligamentous with right L5 nerve root compression.  Procedure: After induction of general anesthesia orotracheal ovation patient was placed prone on chest rolls careful positioning pads over knees ulnar nerve roll pad underneath the shoulders back was prepped with DuraPrep there is squared with towels old incision was outlined with sterile skin marker Betadine Steri-Drape applied and laminectomy sheet and drapes timeout procedure was completed Ancef was given prophylactically.  18-gauge spinal needle was placed based on palpable landmarks initially was slightly high with moved L1 level confirmed at the L4-5 interspace.  Skin marker was used incision was made we progressed down to the interlaminar space between L4 and L5 lamina and then a Penfield 4 was placed at that space confirmed with a second crosstable sterilely draped plain radiograph confirming the L4-5 level and then bone was marked.  Lamina was thinned with the bur.  Ligamentum was incised with a 15 scalpel blade and Cushing's pickups and then removed with 2 and 3 mm Kerrison rongeur.  Overhanging the facets were trimmed back there was some hypertrophic ligamentum which was removed.  There was disc protrusion nerve root displacement and nerve was being pushed dorsally on the disc protrusion.  Annulus was incised and actually the disc bulge was slightly caudad to the disc space.  Passes were made through the disc space and some the extruded disc material was removed.  Up-biting, straight regular up-biting, micro up-biting and Epstein curettes were used.  Some amount of disc was out lateral on the right side which was pushed down with Epstein curette graft with micro down  bladder and decompressed.  Disc was flat and hockey-stick could be passed anterior to the dura there is compression a little bit more bone was removed from the foramina so that the nerve was free is 1 around the pedicle.  Copious irrigation epidural space was dry due to use of the bipolar cautery.  Nerve root was free final passes through the disc were made with no remaining fragments need to be removed.  Copious irrigation closure of the fascial layer with #1 Vicryl suture 2-0 Vicryl subtenons tissue subcuticular closure tincture benzoin Steri-Strips Dermabond and postop dressing was applied.

## 2023-04-08 ENCOUNTER — Encounter (HOSPITAL_COMMUNITY): Payer: Self-pay | Admitting: Orthopaedic Surgery

## 2023-04-08 DIAGNOSIS — M5116 Intervertebral disc disorders with radiculopathy, lumbar region: Secondary | ICD-10-CM | POA: Diagnosis not present

## 2023-04-08 MED ORDER — METHOCARBAMOL 500 MG PO TABS: 500.0000 mg | ORAL_TABLET | Freq: Four times a day (QID) | ORAL | 0 refills | Status: DC | PRN

## 2023-04-08 MED ORDER — HYDROCODONE-ACETAMINOPHEN 7.5-325 MG PO TABS: 1.0000 | ORAL_TABLET | ORAL | 0 refills | Status: DC | PRN

## 2023-04-08 NOTE — Discharge Instructions (Signed)
Your dressing is waterproof you can shower.  If for some reason dressing peels off you can replace it with some gauze and tape so you are slacks her pain is do not rub on the incision.  Do not apply any medication or ointments to the incision.  Your dressing should last until you come back next week.  Avoid bending lifting twisting.  You can lay on your side back or stomach or kickback on the recliner.  Avoid sitting much other than eating or going to the bathroom.  Walk daily gradually working way up to 2 miles a day over several weeks.

## 2023-04-08 NOTE — Progress Notes (Signed)
Patient ID: Maria Velasquez, female   DOB: [birth year], 47 y.o.   MRN: [id number]   Subjective: 1 Day Post-Op Procedure(s) (LRB): RIGHT LUMBAR 4-5 MICRODISCECTOMY (N/A) Patient reports pain as mild.    Objective: Vital signs in last 24 hours: Temp:  [97.6 F (36.4 C)-98.4 F (36.9 C)] 98.4 F (36.9 C) (10/15 0335) Pulse Rate:  [75-98] 75 (10/15 0335) Resp:  [10-20] 18 (10/15 0335) BP: (81-135)/(52-92) 102/55 (10/15 0335) SpO2:  [92 %-100 %] 98 % (10/15 0335) Weight:  [80.3 kg] 80.3 kg (10/14 1025)  Intake/Output from previous day: 10/14 0701 - 10/15 0700 In: 1280 [P.O.:480; I.V.:700; IV Piggyback:100] Out: 5 [Blood:5] Intake/Output this shift: No intake/output data recorded.  No results for input(s): "HGB" in the last 72 hours. No results for input(s): "WBC", "RBC", "HCT", "PLT" in the last 72 hours. No results for input(s): "NA", "K", "CL", "CO2", "BUN", "CREATININE", "GLUCOSE", "CALCIUM" in the last 72 hours. No results for input(s): "LABPT", "INR" in the last 72 hours.  Neurologically intact DG Lumbar Spine 2-3 Views  Result Date: 04/07/2023 CLINICAL DATA:  Elective surgery, intraop localization. EXAM: LUMBAR SPINE - 2-3 VIEW COMPARISON:  Lumbar spine MRI 11/09/2022 labeled with 5 non-rib-bearing lumbar vertebra. FINDINGS: Three portable cross-table lateral views of the lumbar spine obtained in the operating room. Film 1 demonstrates surgical instrument localizing posteriorly at the L3-L4 level. Film 2 demonstrates surgical instruments localizing posteriorly at the L4-L5 level. Film 3 demonstrates surgical instruments posteriorly at the L4-L5 level. IMPRESSION: Intraoperative localization during lumbar spine surgery. Electronically Signed   By: Narda Rutherford M.D.   On: 04/07/2023 17:40    Assessment/Plan: 1 Day Post-Op Procedure(s) (LRB): RIGHT LUMBAR 4-5 MICRODISCECTOMY (N/A) Plan: walk halls this AM and then discharge home. Dressing change before discharge.   [name] 04/08/2023, 7:41 AM

## 2023-04-08 NOTE — Progress Notes (Signed)
Patient alert and oriented, mae's well, voiding adequate amount of urine, swallowing without difficulty, no c/o pain at time of discharge. Patient discharged home with husband. Script and discharged instructions given to patient. Patient and husband stated understanding of instructions given. Patient has an appointment with Dr. Ophelia Charter

## 2023-04-08 NOTE — Evaluation (Signed)
Occupational Therapy Evaluation Patient Details Name: Maria Velasquez MRN: [id number] DOB: October 08, 1975 Today's Date: 04/08/2023   History of Present Illness Pt is a 47 y/o F s/p R L4-5 microdiscectomy on 10/14 for disc protrusion and partial L foot drop. PMH includes back surgery.   Clinical Impression   Pt reports ind at baseline with ADLs/functional mobility, lives with family who can assist at d/c. Pt currently needing set up - supervision for ADLs, mod I for bed mobility and CGA for transfers with and without RW. Provided pt with back precautions handout and reviewed log roll technique for bed mobility, compensatory strategies for ADLs, pt verbalizes understanding. Discussed use of 3in1 as shower seat for home, pt concerned with fall risk since she has a puppy that frequently is "under her feet", recommending RW as well as pt feels more secure mobilizing with RW. Pt presenting with impairments listed below, will follow acutely. Anticipate no OT follow up need at d/c.       If plan is discharge home, recommend the following: A little help with bathing/dressing/bathroom;Assist for transportation    Functional Status Assessment  Patient has had a recent decline in their functional status and demonstrates the ability to make significant improvements in function in a reasonable and predictable amount of time.  Equipment Recommendations  BSC/3in1;Other (comment) (RW)    Recommendations for Other Services PT consult     Precautions / Restrictions Precautions Precautions: Back Precaution Booklet Issued: Yes (comment) Precaution Comments: educated on back prec Required Braces or Orthoses:  (no brace needed per orders) Restrictions Weight Bearing Restrictions: No      Mobility Bed Mobility Overal bed mobility: Modified Independent             General bed mobility comments: use of log roll technique    Transfers Overall transfer level: Needs assistance Equipment used: None,  Rolling walker (2 wheels) Transfers: Sit to/from Stand Sit to Stand: Contact guard assist                  Balance Overall balance assessment: Mild deficits observed, not formally tested                                         ADL either performed or assessed with clinical judgement   ADL Overall ADL's : Needs assistance/impaired Eating/Feeding: Set up   Grooming: Set up   Upper Body Bathing: Supervision/ safety   Lower Body Bathing: Supervison/ safety   Upper Body Dressing : Supervision/safety   Lower Body Dressing: Supervision/safety   Toilet Transfer: Supervision/safety   Toileting- Clothing Manipulation and Hygiene: Supervision/safety       Functional mobility during ADLs: Supervision/safety       Vision   Vision Assessment?: No apparent visual deficits     Perception Perception: Not tested       Praxis Praxis: Not tested       Pertinent Vitals/Pain Pain Assessment Pain Assessment: Faces Pain Score: 4  Faces Pain Scale: Hurts little more Pain Location: L calf Pain Descriptors / Indicators: Discomfort Pain Intervention(s): Limited activity within patient's tolerance, Monitored during session, Repositioned     Extremity/Trunk Assessment Upper Extremity Assessment Upper Extremity Assessment: Overall WFL for tasks assessed   Lower Extremity Assessment Lower Extremity Assessment: Defer to PT evaluation   Cervical / Trunk Assessment Cervical / Trunk Assessment: Back Surgery   Communication Communication Communication: No apparent  difficulties   Cognition Arousal: Alert Behavior During Therapy: WFL for tasks assessed/performed Overall Cognitive Status: Within Functional Limits for tasks assessed                                       General Comments  VSS    Exercises     Shoulder Instructions      Home Living Family/patient expects to be discharged to:: Private residence Living Arrangements:  Spouse/significant other Available Help at Discharge: Family;Available 24 hours/day Type of Home: House Home Access: Stairs to enter [employer] of Steps: 2   Home Layout: One level     Bathroom Shower/Tub: Producer, television/film/video: Handicapped height     Home Equipment: None          Prior Functioning/Environment Prior Level of Function : Independent/Modified Independent             Mobility Comments: no AD use ADLs Comments: ind        OT Problem List: Decreased strength;Decreased range of motion;Decreased activity tolerance;Impaired balance (sitting and/or standing)      OT Treatment/Interventions: Therapeutic exercise;Self-care/ADL training;Energy conservation;DME and/or AE instruction;Therapeutic activities;Patient/family education;Balance training    OT Goals(Current goals can be found in the care plan section) Acute Rehab OT Goals Patient Stated Goal: none stated OT Goal Formulation: With patient Time For Goal Achievement: 04/22/23 Potential to Achieve Goals: Good  OT Frequency: Min 1X/week    Co-evaluation              AM-PAC OT "6 Clicks" Daily Activity     Outcome Measure Help from another person eating meals?: None Help from another person taking care of personal grooming?: None Help from another person toileting, which includes using toliet, bedpan, or urinal?: A Little Help from another person bathing (including washing, rinsing, drying)?: A Little Help from another person to put on and taking off regular upper body clothing?: A Little Help from another person to put on and taking off regular lower body clothing?: A Little 6 Click Score: 20   End of Session Equipment Utilized During Treatment: Rolling walker (2 wheels) Nurse Communication: Mobility status  Activity Tolerance: Patient tolerated treatment well Patient left: in bed;with call bell/phone within reach  OT Visit Diagnosis: Unsteadiness on feet (R26.81);Other  abnormalities of gait and mobility (R26.89);Muscle weakness (generalized) (M62.81)                Time: 4098-1191 OT Time Calculation (min): 27 min Charges:  OT General Charges $OT Visit: 1 Visit OT Evaluation $OT Eval Low Complexity: 1 Low OT Treatments $Self Care/Home Management : 8-22 mins  Maria Velasquez, OTD, OTR/L SecureChat Preferred Acute Rehab [phone]   Maria Velasquez 04/08/2023, 9:06 AM

## 2023-04-08 NOTE — Discharge Summary (Signed)
Physician Discharge Summary  Patient ID: Maria Velasquez MRN: [id number] DOB/AGE: 01-29-1976 47 y.o.  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  Previous surgery was done about 20 years ago by me.  She had failed conservative treatment and had a right partial foot drop EHL weakness failed conservative treatment.  She was admitted for microdiscectomy with good relief postop of right leg pain.  She was amatory in the halls good relief of pain and was discharged the following morning.  Consults: Patient was seen by physical therapy.  Significant Diagnostic Studies: Routine admission CBC.  PCR was negative.  Treatments: Surgery as above  Discharge Exam: Blood pressure (!) 103/51, pulse 76, temperature 98.2 F (36.8 C), temperature source Oral, resp. rate 16, height 5\' 3"  (1.6 m), weight 80.3 kg, last menstrual period 10/19/2014, SpO2 99%. Physical exam: Dressing was dry incision look good dressing change and discharge home.    Disposition: Discharge disposition: 01-Home or Self Care       Discharge Instructions     Incentive spirometry RT   Complete by: As directed       Allergies as of 04/08/2023       Reactions   Ciprofloxacin    Unsure reaction   Oxycodone Hives, Itching, Other (See Comments)   Causes the sensation of skin crawling.   Prednisone Other (See Comments)   Headaches and nausea        Medication List     STOP taking these medications    HYDROmorphone 2 MG tablet Commonly known as: DILAUDID   ibuprofen 200 MG tablet Commonly known as: ADVIL       TAKE these medications    ALPRAZolam 0.5 MG tablet Commonly known as:  XANAX Take 0.5 mg by mouth at bedtime as needed for anxiety or sleep.   estradiol 0.1 MG/GM vaginal cream Commonly known as: ESTRACE Place 1 g vaginally every 14 (fourteen) days.   Gabapentin (Once-Daily) 300 MG Tabs Take 1 tablet (300 mg total) by mouth every evening. What changed:  when to take this reasons to take this   HYDROcodone-acetaminophen 7.5-325 MG tablet Commonly known as: NORCO Take 1 tablet by mouth every 4 (four) hours as needed for moderate pain (pain score 4-6) ((score 4 to 6)).   methocarbamol 500 MG tablet Commonly known as: ROBAXIN Take 1 tablet (500 mg total) by mouth every 6 (six) hours as needed for muscle spasms.   naproxen sodium 220 MG tablet Commonly known as: ALEVE Take 440 mg by mouth 2 (two) times daily as needed (pain).   omeprazole 40 MG capsule Commonly known as: PRILOSEC TAKE ONE CAPSULE BY MOUTH DAILY 30 MINUTES BEFORE breakfast   phentermine 37.5 MG tablet Commonly known as: ADIPEX-P Take 37.5 mg by mouth daily.        Follow-up Information     [name], MD Follow up.   Specialty: Orthopedic Surgery Why: See Dr. Ophelia Charter next week either even clinic or [facility] office your choice. Contact information: [address] Sissy [place] [address] [phone]               Discharge instructions given show walk daily.  Office follow-up next week in the [facility].  She will avoid  bending turning twisting.  Okay to shower keeping her dressing on.  Signed: [name] 04/08/2023, 8:27 AM

## 2023-04-08 NOTE — Plan of Care (Signed)
°  Problem: Education: °Goal: Ability to verbalize activity precautions or restrictions will improve °Outcome: Completed/Met °Goal: Knowledge of the prescribed therapeutic regimen will improve °Outcome: Completed/Met °Goal: Understanding of discharge needs will improve °Outcome: Completed/Met °  °Problem: Activity: °Goal: Ability to avoid complications of mobility impairment will improve °Outcome: Completed/Met °Goal: Ability to tolerate increased activity will improve °Outcome: Completed/Met °Goal: Will remain free from falls °Outcome: Completed/Met °  °Problem: Bowel/Gastric: °Goal: Gastrointestinal status for postoperative course will improve °Outcome: Completed/Met °  °Problem: Clinical Measurements: °Goal: Ability to maintain clinical measurements within normal limits will improve °Outcome: Completed/Met °Goal: Postoperative complications will be avoided or minimized °Outcome: Completed/Met °Goal: Diagnostic test results will improve °Outcome: Completed/Met °  °Problem: Pain Management: °Goal: Pain level will decrease °Outcome: Completed/Met °  °Problem: Skin Integrity: °Goal: Will show signs of wound healing °Outcome: Completed/Met °  °Problem: Health Behavior/Discharge Planning: °Goal: Identification of resources available to assist in meeting health care needs will improve °Outcome: Completed/Met °  °Problem: Bladder/Genitourinary: °Goal: Urinary functional status for postoperative course will improve °Outcome: Completed/Met °  °

## 2023-04-08 NOTE — Progress Notes (Signed)
Physical Therapy Brief Evaluation and Discharge Note Patient Details Name: Maria Velasquez MRN: [id number] DOB: 20-Dec-1975 Today's Date: 04/08/2023   History of Present Illness  Pt is a 47 y/o F s/p R L4-5 microdiscectomy for disc protrusion and partial L foot drop. PMH includes back surgery.   Clinical Impression  Patient evaluated by Physical Therapy with no further acute PT needs identified. All education has been completed and the patient has no further questions. Pt was able to demonstrate transfers and ambulation with gross modified independence and RW for support. Pt was educated on precautions, positioning recommendations, appropriate activity progression, and car transfer. See below for any follow-up Physical Therapy or equipment needs. PT is signing off. Thank you for this referral.        PT Assessment Patient does not need any further PT services  Assistance Needed at Discharge  PRN    Equipment Recommendations Rolling walker (2 wheels);BSC/3in1  Recommendations for Other Services       Precautions/Restrictions Precautions Precautions: Back Precaution Booklet Issued: Yes (comment) Precaution Comments: Reviewed handout and pt was cued for precautions during functional mobility. Required Braces or Orthoses:  (no brace needed per orders) Restrictions Weight Bearing Restrictions: No        Mobility  Bed Mobility       General bed mobility comments: Pt was received sitting up on EOB  Transfers Overall transfer level: Modified independent Equipment used: Rolling walker (2 wheels) Transfers: Sit to/from Stand             General transfer comment: VC's for hand placement on seated surface for safety.    Ambulation/Gait Ambulation/Gait assistance: Modified independent (Device/Increase time) Gait Distance (Feet): 300 Feet Assistive device: Rolling walker (2 wheels) Gait Pattern/deviations: Step-through pattern, Decreased stride length, Trunk flexed Gait  Speed: Below normal General Gait Details: VC's for improved posture, closer walker proximity and forward gaze. No assistance required and no overt LOB noted. Pt moving slow and guarded due to pain but was able to improve gait speed with cues.  Home Activity Instructions Home Activity Instructions: We discussed placing walker up onto each step before advancing  Stairs            Modified Rankin (Stroke Patients Only)        Balance Overall balance assessment: Mild deficits observed, not formally tested Sitting-balance support: Feet supported, No upper extremity supported Sitting balance-Leahy Scale: Fair     Standing balance support: Bilateral upper extremity supported, During functional activity Standing balance-Leahy Scale: Fair            Pertinent Vitals/Pain PT - Brief Vital Signs All Vital Signs Stable: Yes Pain Assessment Pain Assessment: Faces Faces Pain Scale: Hurts a little bit Pain Location: incision site Pain Descriptors / Indicators: Operative site guarding, Sore Pain Intervention(s): Limited activity within patient's tolerance, Monitored during session, Repositioned     Home Living Family/patient expects to be discharged to:: Private residence Living Arrangements: Spouse/significant other Available Help at Discharge: Family;Available 24 hours/day Home Environment: Stairs to enter  [employer] of Steps: 1 deep step x2 (pt can put walker up on whole step) Home Equipment: None        Prior Function Level of Independence: Independent Comments: Working, driving    UE/LE Assessment   UE ROM/Strength/Tone/Coordination: WFL    LE ROM/Strength/Tone/Coordination: Generalized weakness (Mild; consistent with pre-op diagnosis)      Communication   Communication Communication: No apparent difficulties Cueing Techniques: Verbal cues     Cognition Overall Cognitive  Status: Appears within functional limits for tasks assessed/performed        General Comments General comments (skin integrity, edema, etc.): VSS    Exercises     Assessment/Plan    PT Problem List         PT Visit Diagnosis Unsteadiness on feet (R26.81);Pain    No Skilled PT All education completed;Patient is modified independent with all activity/mobility   Co-evaluation                AMPAC 6 Clicks Help needed turning from your back to your side while in a flat bed without using bedrails?: None Help needed moving from lying on your back to sitting on the side of a flat bed without using bedrails?: None Help needed moving to and from a bed to a chair (including a wheelchair)?: None Help needed standing up from a chair using your arms (e.g., wheelchair or bedside chair)?: None Help needed to walk in hospital room?: None Help needed climbing 3-5 steps with a railing? : A Little 6 Click Score: 23      End of Session Equipment Utilized During Treatment: Gait belt Activity Tolerance: Patient tolerated treatment well Patient left: in bed;with call bell/phone within reach Nurse Communication: Mobility status PT Visit Diagnosis: Unsteadiness on feet (R26.81);Pain Pain - part of body:  (back)     Time: 1027-1040 PT Time Calculation (min) (ACUTE ONLY): 13 min  Charges:   PT Evaluation $PT Eval Low Complexity: 1 Low      Conni Slipper, PT, DPT Acute Rehabilitation Services Secure Chat Preferred Office: [phone]   Marylynn Pearson  04/08/2023, 10:53 AM

## 2023-04-10 NOTE — Anesthesia Postprocedure Evaluation (Signed)
Anesthesia Post Note  Patient: Maria Velasquez  Procedure(s) Performed: RIGHT LUMBAR 4-5 MICRODISCECTOMY (Spine Lumbar)     Patient location during evaluation: PACU Anesthesia Type: General Level of consciousness: awake and alert Pain management: pain level controlled Vital Signs Assessment: post-procedure vital signs reviewed and stable Respiratory status: spontaneous breathing, nonlabored ventilation and respiratory function stable Cardiovascular status: blood pressure returned to baseline and stable Postop Assessment: no apparent nausea or vomiting Anesthetic complications: no   No notable events documented.           Pamala Hayman

## 2023-04-16 ENCOUNTER — Encounter: Payer: Self-pay | Admitting: Orthopaedic Surgery

## 2023-04-16 ENCOUNTER — Telehealth: Payer: Self-pay | Admitting: Radiology

## 2023-04-16 ENCOUNTER — Ambulatory Visit (INDEPENDENT_AMBULATORY_CARE_PROVIDER_SITE_OTHER): Payer: BC Managed Care – PPO | Admitting: Orthopaedic Surgery

## 2023-04-16 VITALS — Ht 63.0 in | Wt 177.0 lb

## 2023-04-16 DIAGNOSIS — Z9889 Other specified postprocedural states: Secondary | ICD-10-CM

## 2023-04-16 NOTE — Telephone Encounter (Signed)
Patient called back after speaking with HR at work. We have given her a note to be out of work x 4 weeks but she would like to return if you think that she can. She usually works sitting, but states that she can use a stand up desk if you would prefer as she has one. She can also take breaks to walk around if you agree and will let her go back. She is asking to return 4 hours per day stating she is to alternate between sitting and standing and taking breaks. She will have a driver so that she does not have to drive to work. She is also asking if it is possible for her to work the other 4 hours a day at home if work is available.     Could you please advise whether this is ok? She thought you may have taken her out because you were not aware of all of the accommodations work can give her.

## 2023-04-16 NOTE — Progress Notes (Signed)
Post-Op Visit Note   Patient: Maria Velasquez           Date of Birth: 11/12/1975           MRN: [id number] Visit Date: 04/16/2023 PCP: [name], PA-C   Assessment & Plan: Follow-up right L4-5 microdiscectomy she had degenerative endplate changes at L5-S1 with narrowing but no significant compression.  EHL is strong which was weak preop.  Anterior tib is strong.  Negative straight leg raising to 90 degrees incision looks good Dermabond was used to subcuticular closure Band-Aid applied so long half friction from her close.  Recheck 4 weeks work slip given no work x 4 weeks.  Patient works for the school system.  Chief Complaint:  Chief Complaint  Patient presents with   Lower Back - Routine Post Op    04/07/2023 Right L4-5 microdiscectomy   Visit Diagnoses:  1. S/P lumbar microdiscectomy     Plan: Work slip given no work x 4 weeks.  Recheck 4 weeks.  Follow-Up Instructions: No follow-ups on file.   Orders:  No orders of the defined types were placed in this encounter.  No orders of the defined types were placed in this encounter.   Imaging: No results found.  PMFS History: Patient Active Problem List   Diagnosis Date Noted   S/P lumbar microdiscectomy 04/07/2023   Radiculopathy, lumbar region, right L5 01/01/2023   Pain of upper abdomen 01/01/2021   Gastroesophageal reflux disease 01/01/2021   Bacterial vaginosis 10/05/2020   Vaginal dysplasia 10/05/2020   Past Medical History:  Diagnosis Date   Cancer (HCC)    Vaginal   GERD (gastroesophageal reflux disease)    Headache    otc med prn   Heartburn    History of cervical dysplasia    History of uterine fibroid    Lipoma    Vaginal dysplasia     Family History  Problem Relation Age of Onset   Breast cancer Mother    Pancreatic cancer Maternal Grandfather        early 27s   Colon cancer Paternal Grandfather        mid 74s    Past Surgical History:  Procedure Laterality Date   BIOPSY  02/09/2021    Procedure: BIOPSY;  Surgeon: Lanelle Bal, DO;  [facility];  Service: Endoscopy;;   CO2 LASER APPLICATION N/A 10/26/2020   Procedure: CO2 LASER APPLICATION OF VAGINA;  Surgeon: Carver Fila, MD;  Location: [facility];  Service: Gynecology;  Laterality: N/A;   COLONOSCOPY WITH PROPOFOL N/A 02/09/2021   Procedure: COLONOSCOPY WITH PROPOFOL;  Surgeon: Lanelle Bal, DO;  [facility]: [facility];  Service: Endoscopy;  Laterality: N/A;  10:00am   DILATION AND CURETTAGE OF UTERUS  08/05/2009  @[facility]    missed abortion   ESOPHAGOGASTRODUODENOSCOPY (EGD) WITH PROPOFOL N/A 02/09/2021   Procedure: ESOPHAGOGASTRODUODENOSCOPY (EGD) WITH PROPOFOL;  Surgeon: Lanelle Bal, DO;  [facility];  Service: Endoscopy;  Laterality: N/A;   LAPAROSCOPIC SALPINGO OOPHERECTOMY Right 08-28-2018  @[facility]    ovarian torsion   LAPAROSCOPIC TUBAL LIGATION W/ FILCHIE CLIPS Bilateral 03-20-2010  @[facility]    LUMBAR DISC SURGERY  01-30-2007  @[facility]    L4-- L5   LUMBAR LAMINECTOMY N/A 04/07/2023   Procedure: RIGHT LUMBAR 4-5 MICRODISCECTOMY;  Surgeon: [name], MD;  Location: [facility];  Service: Orthopedics;  Laterality: N/A;  Needs RNFA   ROBOTIC ASSISTED BILATERAL SALPINGO OOPHERECTOMY Bilateral 11/16/2014   Procedure: ROBOTIC ASSISTED BILATERAL  SALPINGECTOMY;  Surgeon: Carrington Clamp, MD;  Location: [facility];  Service: Gynecology;  Laterality: Bilateral;   ROBOTIC ASSISTED TOTAL HYSTERECTOMY N/A 11/16/2014   w/ BS   VULVA Ples Specter BIOPSY N/A 10/26/2020   Procedure: VAGINAL BIOPSY;  Surgeon: Carver Fila, MD;  Location: [facility];  Service: Gynecology;  Laterality: N/A;   WISDOM TOOTH EXTRACTION  1997   Social History   Occupational History   Not on file  Tobacco Use   Smoking status: Never   Smokeless tobacco: Never  Vaping Use   Vaping status: Never Used  Substance and Sexual Activity   Alcohol use: Yes    Comment: occasional wine   Drug use: Never    Sexual activity: Not Currently    Birth control/protection: Surgical    Comment: hyst

## 2023-04-16 NOTE — Telephone Encounter (Signed)
noted 

## 2023-04-21 ENCOUNTER — Telehealth: Payer: BC Managed Care – PPO | Admitting: Neurosurgery

## 2023-04-23 ENCOUNTER — Telehealth: Payer: Self-pay | Admitting: Radiology

## 2023-04-23 ENCOUNTER — Other Ambulatory Visit: Payer: Self-pay | Admitting: Orthopaedic Surgery

## 2023-04-23 MED ORDER — HYDROCODONE-ACETAMINOPHEN 5-325 MG PO TABS: 1.0000 | ORAL_TABLET | Freq: Four times a day (QID) | ORAL | 0 refills | Status: DC | PRN

## 2023-04-23 MED ORDER — METHOCARBAMOL 500 MG PO TABS: 500.0000 mg | ORAL_TABLET | Freq: Four times a day (QID) | ORAL | 0 refills | Status: DC | PRN

## 2023-04-23 MED ORDER — METHOCARBAMOL 500 MG PO TABS: 500.0000 mg | ORAL_TABLET | Freq: Four times a day (QID) | ORAL | 0 refills | Status: AC | PRN

## 2023-04-23 MED ORDER — HYDROCODONE-ACETAMINOPHEN 5-325 MG PO TABS
1.0000 | ORAL_TABLET | Freq: Four times a day (QID) | ORAL | 0 refills | Status: DC | PRN
Start: 2023-04-23 — End: 2023-04-23

## 2023-04-23 NOTE — Telephone Encounter (Signed)
Patient is status post Right L4-5 microdiscectomy on 04/07/2023. She has the following questions.   Patient called requesting refill on muscle relaxer and pain medication. She does ask for something a little less strong than the hydrocodone 7.5/325 if possible.   She uses [employer].   Would like to know if she can restart phentermine which she takes for weight loss?  Would like to know if there are some light stretches that you recommend her doing?  CB [phone]

## 2023-05-15 ENCOUNTER — Encounter: Payer: Self-pay | Admitting: Orthopaedic Surgery

## 2023-05-15 ENCOUNTER — Ambulatory Visit: Payer: BC Managed Care – PPO | Admitting: Orthopaedic Surgery

## 2023-05-15 VITALS — Ht 63.0 in | Wt 177.0 lb

## 2023-05-15 DIAGNOSIS — Z9889 Other specified postprocedural states: Secondary | ICD-10-CM

## 2023-05-15 NOTE — Progress Notes (Signed)
Post-Op Visit Note   Patient: Maria Velasquez           Date of Birth: 06-27-1975           MRN: [id number] Visit Date: 05/15/2023 PCP: [name], PA-C   Assessment & Plan:  Chief Complaint:  Chief Complaint  Patient presents with   Lower Back - Routine Post Op, Follow-up     04/07/2023 Right L4-5 microdiscectomy      Visit Diagnoses:  1. S/P lumbar microdiscectomy     Plan: Follow-up microdiscectomy she is now 5 and half weeks.  Work slip given for work resumption on 05/26/2023.  No leg pain she still has some pain at the SI joint.  She is going to [employer] on the treadmill a mile and a half.  She is happy with the relief of her leg pain and is off the pain medication and is just using some Tylenol and ibuprofen.  Recheck 5 weeks.  Follow-Up Instructions: No follow-ups on file.   Orders:  No orders of the defined types were placed in this encounter.  No orders of the defined types were placed in this encounter.   Imaging: No results found.  PMFS History: Patient Active Problem List   Diagnosis Date Noted   S/P lumbar microdiscectomy 04/07/2023   Radiculopathy, lumbar region, right L5 01/01/2023   Pain of upper abdomen 01/01/2021   Gastroesophageal reflux disease 01/01/2021   Bacterial vaginosis 10/05/2020   Vaginal dysplasia 10/05/2020   Past Medical History:  Diagnosis Date   Cancer (HCC)    Vaginal   GERD (gastroesophageal reflux disease)    Headache    otc med prn   Heartburn    History of cervical dysplasia    History of uterine fibroid    Lipoma    Vaginal dysplasia     Family History  Problem Relation Age of Onset   Breast cancer Mother    Pancreatic cancer Maternal Grandfather        early 42s   Colon cancer Paternal Grandfather        mid 41s    Past Surgical History:  Procedure Laterality Date   BIOPSY  02/09/2021   Procedure: BIOPSY;  Surgeon: Lanelle Bal, DO;  [facility];  Service: Endoscopy;;   CO2  LASER APPLICATION N/A 10/26/2020   Procedure: CO2 LASER APPLICATION OF VAGINA;  Surgeon: Carver Fila, MD;  Location: [facility];  Service: Gynecology;  Laterality: N/A;   COLONOSCOPY WITH PROPOFOL N/A 02/09/2021   Procedure: COLONOSCOPY WITH PROPOFOL;  Surgeon: Lanelle Bal, DO;  [facility];  Service: Endoscopy;  Laterality: N/A;  10:00am   DILATION AND CURETTAGE OF UTERUS  08/05/2009  @[facility]    missed abortion   ESOPHAGOGASTRODUODENOSCOPY (EGD) WITH PROPOFOL N/A 02/09/2021   Procedure: ESOPHAGOGASTRODUODENOSCOPY (EGD) WITH PROPOFOL;  Surgeon: Lanelle Bal, DO;  [facility];  Service: Endoscopy;  Laterality: N/A;   LAPAROSCOPIC SALPINGO OOPHERECTOMY Right 08-28-2018  @[facility]    ovarian torsion   LAPAROSCOPIC TUBAL LIGATION W/ FILCHIE CLIPS Bilateral 03-20-2010  @[facility]    LUMBAR DISC SURGERY  01-30-2007  @[facility]    L4-- L5   LUMBAR LAMINECTOMY N/A 04/07/2023   Procedure: RIGHT LUMBAR 4-5 MICRODISCECTOMY;  Surgeon: [name], MD;  Location: [facility];  Service: Orthopedics;  Laterality: N/A;  Needs RNFA   ROBOTIC ASSISTED BILATERAL SALPINGO OOPHERECTOMY Bilateral 11/16/2014   Procedure: ROBOTIC ASSISTED BILATERAL SALPINGECTOMY;  Surgeon: Carrington Clamp, MD;  Location: [facility];  Service: Gynecology;  Laterality: Bilateral;   ROBOTIC ASSISTED TOTAL HYSTERECTOMY N/A 11/16/2014   w/ BS   VULVA Ples Specter BIOPSY N/A 10/26/2020   Procedure: VAGINAL BIOPSY;  Surgeon: Carver Fila, MD;  Location: [facility];  Service: Gynecology;  Laterality: N/A;   WISDOM TOOTH EXTRACTION  1997   Social History   Occupational History   Not on file  Tobacco Use   Smoking status: Never   Smokeless tobacco: Never  Vaping Use   Vaping status: Never Used  Substance and Sexual Activity   Alcohol use: Yes    Comment: occasional wine   Drug use: Never   Sexual activity: Not Currently    Birth control/protection: Surgical    Comment: hyst

## 2023-05-27 ENCOUNTER — Other Ambulatory Visit: Payer: Self-pay | Admitting: Gastroenterology

## 2023-05-27 NOTE — Telephone Encounter (Signed)
Sent the patient a message thru [employer]

## 2023-05-27 NOTE — Telephone Encounter (Signed)
Patient has not been seen in over 2 years.  She is going to need an office visit for additional refills.  Please reach patient to arrange follow-up.

## 2023-06-12 ENCOUNTER — Other Ambulatory Visit: Payer: Self-pay

## 2023-06-12 ENCOUNTER — Encounter: Payer: Self-pay | Admitting: Orthopaedic Surgery

## 2023-06-12 ENCOUNTER — Ambulatory Visit: Payer: BC Managed Care – PPO | Admitting: Orthopaedic Surgery

## 2023-06-12 VITALS — Ht 63.0 in | Wt 177.0 lb

## 2023-06-12 DIAGNOSIS — Z9889 Other specified postprocedural states: Secondary | ICD-10-CM

## 2023-06-12 DIAGNOSIS — M25571 Pain in right ankle and joints of right foot: Secondary | ICD-10-CM | POA: Insufficient documentation

## 2023-06-12 NOTE — Progress Notes (Signed)
Post-Op Visit Note   Patient: Maria Velasquez           Date of Birth: Mar 02, 1976           MRN: [id number] Visit Date: 06/12/2023 PCP: [name], PA-C   Assessment & Plan: Follow-up microdiscectomy right L4-5.  She is back at work still has some stiffness in her back after sitting for 30 minutes.  She has had some discomfort in her ankle where she had an old ankle sprain and points to the anterolateral aspect of the ankle.  She is amatory without significant limp.  She notes some swelling of her foot at the end of the day and has trace dorsiflexion weakness significantly improved from surgery.  Negative straight leg raising to 90 degrees negative popliteal compression test.  Negative anterior drawer with good endpoint.  Peroneals are strong.  Will place her in an ankle Swede-O brace for her right ankle.  Recheck her in a month.  She can gradually get back to the gym and do her circuit work that she enjoyed.  Chief Complaint:  Chief Complaint  Patient presents with   Right Ankle - Pain   Lower Back - Routine Post Op    04/07/2023 Right L4-5 microdiscectomy   Visit Diagnoses:  1. Pain in right ankle and joints of right foot   2. S/P lumbar microdiscectomy     Plan: Swede-O brace for ankle symptoms.  Follow-Up Instructions: Return in about 1 month (around 07/13/2023).   Orders:  Orders Placed This Encounter  Procedures   XR Ankle Complete Right   No orders of the defined types were placed in this encounter.   Imaging: No results found.  PMFS History: Patient Active Problem List   Diagnosis Date Noted   Pain in right ankle and joints of right foot 06/12/2023   S/P lumbar microdiscectomy 04/07/2023   Radiculopathy, lumbar region, right L5 01/01/2023   Pain of upper abdomen 01/01/2021   Gastroesophageal reflux disease 01/01/2021   Bacterial vaginosis 10/05/2020   Vaginal dysplasia 10/05/2020   Past Medical History:  Diagnosis Date   Cancer (HCC)    Vaginal    GERD (gastroesophageal reflux disease)    Headache    otc med prn   Heartburn    History of cervical dysplasia    History of uterine fibroid    Lipoma    Vaginal dysplasia     Family History  Problem Relation Age of Onset   Breast cancer Mother    Pancreatic cancer Maternal Grandfather        early 62s   Colon cancer Paternal Grandfather        mid 65s    Past Surgical History:  Procedure Laterality Date   BIOPSY  02/09/2021   Procedure: BIOPSY;  Surgeon: Lanelle Bal, DO;  [facility];  Service: Endoscopy;;   CO2 LASER APPLICATION N/A 10/26/2020   Procedure: CO2 LASER APPLICATION OF VAGINA;  Surgeon: Carver Fila, MD;  Location: [facility] [place];  Service: Gynecology;  Laterality: N/A;   COLONOSCOPY WITH PROPOFOL N/A 02/09/2021   Procedure: COLONOSCOPY WITH PROPOFOL;  Surgeon: Lanelle Bal, DO;  Location: [facility];  Service: Endoscopy;  Laterality: N/A;  10:00am   DILATION AND CURETTAGE OF UTERUS  08/05/2009  @[facility]    missed abortion   ESOPHAGOGASTRODUODENOSCOPY (EGD) WITH PROPOFOL N/A 02/09/2021   Procedure: ESOPHAGOGASTRODUODENOSCOPY (EGD) WITH PROPOFOL;  Surgeon: Lanelle Bal, DO;  [facility];  Service:  Endoscopy;  Laterality: N/A;   LAPAROSCOPIC SALPINGO OOPHERECTOMY Right 08-28-2018  @[facility]    ovarian torsion   LAPAROSCOPIC TUBAL LIGATION W/ FILCHIE CLIPS Bilateral 03-20-2010  @[facility]    LUMBAR DISC SURGERY  01-30-2007  @[facility]    L4-- L5   LUMBAR LAMINECTOMY N/A 04/07/2023   Procedure: RIGHT LUMBAR 4-5 MICRODISCECTOMY;  Surgeon: [name], MD;  Location: [facility];  Service: Orthopedics;  Laterality: N/A;  Needs RNFA   ROBOTIC ASSISTED BILATERAL SALPINGO OOPHERECTOMY Bilateral 11/16/2014   Procedure: ROBOTIC ASSISTED BILATERAL SALPINGECTOMY;  Surgeon: Carrington Clamp, MD;  Location: [facility];  Service: Gynecology;  Laterality: Bilateral;   ROBOTIC ASSISTED TOTAL HYSTERECTOMY N/A 11/16/2014   w/ BS   VULVA Ples Specter BIOPSY N/A  10/26/2020   Procedure: VAGINAL BIOPSY;  Surgeon: Carver Fila, MD;  Location: [facility];  Service: Gynecology;  Laterality: N/A;   WISDOM TOOTH EXTRACTION  1997   Social History   Occupational History   Not on file  Tobacco Use   Smoking status: Never   Smokeless tobacco: Never  Vaping Use   Vaping status: Never Used  Substance and Sexual Activity   Alcohol use: Yes    Comment: occasional wine   Drug use: Never   Sexual activity: Not Currently    Birth control/protection: Surgical    Comment: hyst

## 2023-07-16 ENCOUNTER — Ambulatory Visit: Payer: BC Managed Care – PPO | Admitting: Orthopaedic Surgery

## 2023-08-18 ENCOUNTER — Other Ambulatory Visit: Payer: Self-pay | Admitting: Obstetrics and Gynecology

## 2023-08-18 DIAGNOSIS — Z803 Family history of malignant neoplasm of breast: Secondary | ICD-10-CM

## 2023-09-15 ENCOUNTER — Other Ambulatory Visit: Payer: Self-pay | Admitting: Obstetrics and Gynecology

## 2023-09-15 DIAGNOSIS — Z1231 Encounter for screening mammogram for malignant neoplasm of breast: Secondary | ICD-10-CM

## 2023-09-19 ENCOUNTER — Other Ambulatory Visit: Payer: Self-pay

## 2023-10-24 ENCOUNTER — Ambulatory Visit
Admission: RE | Admit: 2023-10-24 | Discharge: 2023-10-24 | Disposition: A | Discharge status: Home / Self Care | Payer: Self-pay | Source: Ambulatory Visit | Attending: Obstetrics and Gynecology | Admitting: Obstetrics and Gynecology

## 2023-10-24 DIAGNOSIS — Z1231 Encounter for screening mammogram for malignant neoplasm of breast: Secondary | ICD-10-CM

## 2024-04-15 ENCOUNTER — Ambulatory Visit: Attending: Sports Medicine | Admitting: Physical Therapy

## 2024-04-15 ENCOUNTER — Other Ambulatory Visit: Payer: Self-pay

## 2024-04-15 ENCOUNTER — Encounter: Payer: Self-pay | Admitting: Physical Therapy

## 2024-04-15 DIAGNOSIS — M62838 Other muscle spasm: Secondary | ICD-10-CM | POA: Diagnosis present

## 2024-04-15 DIAGNOSIS — M6281 Muscle weakness (generalized): Secondary | ICD-10-CM | POA: Diagnosis present

## 2024-04-15 DIAGNOSIS — M25551 Pain in right hip: Secondary | ICD-10-CM | POA: Insufficient documentation

## 2024-04-15 DIAGNOSIS — M5459 Other low back pain: Secondary | ICD-10-CM | POA: Insufficient documentation

## 2024-04-15 NOTE — Therapy (Signed)
 OUTPATIENT PHYSICAL THERAPY THORACOLUMBAR EVALUATION   Patient Name: Maria Velasquez MRN: [id number] DOB:28-Jun-1975, 48 y.o., female Today's Date: 04/15/2024  END OF SESSION:  PT End of Session - 04/15/24 0937     Visit Number 1    Authorization Type [employer]    PT Start Time 0930    PT Stop Time 1010    PT Time Calculation (min) 40 min          Past Medical History:  Diagnosis Date   Cancer (HCC)    Vaginal   GERD (gastroesophageal reflux disease)    Headache    otc med prn   Heartburn    History of cervical dysplasia    History of uterine fibroid    Lipoma    Vaginal dysplasia    Past Surgical History:  Procedure Laterality Date   BIOPSY  02/09/2021   Procedure: BIOPSY;  Surgeon: Cindie Carlin POUR, DO;  [facility];  Service: Endoscopy;;   CO2 LASER APPLICATION N/A 10/26/2020   Procedure: CO2 LASER APPLICATION OF VAGINA;  Surgeon: Viktoria Comer SAUNDERS, MD;  Location: [facility];  Service: Gynecology;  Laterality: N/A;   COLONOSCOPY WITH PROPOFOL  N/A 02/09/2021   Procedure: COLONOSCOPY WITH PROPOFOL ;  Surgeon: Cindie Carlin POUR, DO;  [facility];  Service: Endoscopy;  Laterality: N/A;  10:00am   DILATION AND CURETTAGE OF UTERUS  08/05/2009  @[facility]    missed abortion   ESOPHAGOGASTRODUODENOSCOPY (EGD) WITH PROPOFOL  N/A 02/09/2021   Procedure: ESOPHAGOGASTRODUODENOSCOPY (EGD) WITH PROPOFOL ;  Surgeon: Cindie Carlin POUR, DO;  [facility];  Service: Endoscopy;  Laterality: N/A;   LAPAROSCOPIC SALPINGO OOPHERECTOMY Right 08-28-2018  @[facility]    ovarian torsion   LAPAROSCOPIC TUBAL LIGATION W/ FILCHIE CLIPS Bilateral 03-20-2010  @[facility]    LUMBAR DISC SURGERY  01-30-2007  @[facility]    L4-- L5   LUMBAR LAMINECTOMY N/A 04/07/2023   Procedure: RIGHT LUMBAR 4-5 MICRODISCECTOMY;  Surgeon: Barbarann Oneil BROCKS, MD;  Location: [facility];  Service: Orthopedics;  Laterality: N/A;  Needs RNFA   ROBOTIC ASSISTED BILATERAL SALPINGO OOPHERECTOMY Bilateral 11/16/2014    Procedure: ROBOTIC ASSISTED BILATERAL SALPINGECTOMY;  Surgeon: Rosaline Luna, MD;  Location: [facility];  Service: Gynecology;  Laterality: Bilateral;   ROBOTIC ASSISTED TOTAL HYSTERECTOMY N/A 11/16/2014   w/ BS   VULVA MARYBETH BIOPSY N/A 10/26/2020   Procedure: VAGINAL BIOPSY;  Surgeon: Viktoria Comer SAUNDERS, MD;  Location[facility];  Service: Gynecology;  Laterality: N/A;   WISDOM TOOTH EXTRACTION  1997   Patient Active Problem List   Diagnosis Date Noted   Pain in right ankle and joints of right foot 06/12/2023   S/P lumbar microdiscectomy 04/07/2023   Radiculopathy, lumbar region, right L5 01/01/2023   Pain of upper abdomen 01/01/2021   Gastroesophageal reflux disease 01/01/2021   Bacterial vaginosis 10/05/2020   Vaginal dysplasia 10/05/2020    PCP: Dow Longs, PA-C  REFERRING PROVIDER: Orpha Asberry RAMAN, MD  REFERRING DIAG: Low Back Pain  Rationale for Evaluation and Treatment: Rehabilitation  THERAPY DIAG:  Pain in right hip  Other low back pain  Other muscle spasm  Muscle weakness (generalized)  ONSET DATE: Slowly over the Summer  SUBJECTIVE:  SUBJECTIVE STATEMENT: Pt states she's had PT before in 2024 but ended up getting back surgery which reduced her radiating symptoms. Pt states PT did help with the pain. Pt states she is starting to get pain again but it's not running down her leg. Pt is unable to lay on her R side. Massage therapy did help some. Can't stand or sit for long periods (no more than 1 hour). Goes to the gym. Feels better with walking, using elliptical and stair master  PERTINENT HISTORY:  Microdiscectomy L4/L5 04/07/23  PAIN:  Are you having pain? Yes: NPRS scale: 5 currently, at worst 7 or 8 Pain location: R low back into posterior hip Pain  description: Aching, sore Aggravating factors: Worse at the end of the day or lay on right side, prolonged sitting/standing Relieving factors: ibuprofen , ice, heat, exercise, meds  PRECAUTIONS: No lifting > 5 lbs  RED FLAGS: None   WEIGHT BEARING RESTRICTIONS: No  FALLS:  Has patient fallen in last 6 months? No  LIVING ENVIRONMENT: Lives with: lives with their family Lives in: House/apartment Stairs: No Has following equipment at home: None  OCCUPATION: Desk work  PLOF: Independent  PATIENT GOALS: Decrease pain, sleep longer than 8 hours, improve movement, less difficulty with home/work activities, stand/sit longer  NEXT MD VISIT: n/a  OBJECTIVE:  Note: Objective measures were completed at Evaluation unless otherwise noted.  DIAGNOSTIC FINDINGS:  No new imaging  PATIENT SURVEYS:  Modified Oswestry:  MODIFIED OSWESTRY DISABILITY SCALE  Date: 04/15/24 Score  Pain intensity 3 =  Pain medication provides me with moderate relief from pain.  2. Personal care (washing, dressing, etc.) 1 =  I can take care of myself normally, but it increases my pain.  3. Lifting 4 = I can lift only very light weights  4. Walking 0 = Pain does not prevent me from walking any distance  5. Sitting 2 =  Pain prevents me from sitting more than 1 hour.  6. Standing 3 =  Pain prevents me from standing more than 1/2 hour.  7. Sleeping 1 = I can sleep well only by using pain medication.  8. Social Life 2 = Pain prevents me from participating in more energetic activities (eg. sports, dancing).  9. Traveling 1 =  I can travel anywhere, but it increases my pain.  10. Employment/ Homemaking 2 = I can perform most of my homemaking/job duties, but pain prevents me from performing more physically stressful activities (eg, lifting, vacuuming).  Total 19/50   Interpretation of scores: Score Category Description  0-20% Minimal Disability The patient can cope with most living activities. Usually no treatment  is indicated apart from advice on lifting, sitting and exercise  21-40% Moderate Disability The patient experiences more pain and difficulty with sitting, lifting and standing. Travel and social life are more difficult and they may be disabled from work. Personal care, sexual activity and sleeping are not grossly affected, and the patient can usually be managed by conservative means  41-60% Severe Disability Pain remains the main problem in this group, but activities of daily living are affected. These patients require a detailed investigation  61-80% Crippled Back pain impinges on all aspects of the patient's life. Positive intervention is required  81-100% Bed-bound These patients are either bed-bound or exaggerating their symptoms  Bluford FORBES Zoe DELENA Karon DELENA, et al. Surgery versus conservative management of stable thoracolumbar fracture: the PRESTO feasibility RCT. [place]): [employer]; 2021 Nov. [facility] Technology Assessment, No. 25.62.) Appendix  3, Oswestry Disability Index category descriptors. Available from: FindJewel[employer].cz  Minimally Clinically Important Difference (MCID) = 12.8%  COGNITION: Overall cognitive status: Within functional limits for tasks assessed     SENSATION: WFL  MUSCLE LENGTH: Hamstrings: WNL  POSTURE: No Significant postural limitations  PALPATION: TTP R piriformis Felt good with PA mob on R SIJ for anterior inominate rotation Felt good with R LE distraction  LOWER EXTREMITY ROM:   L hip flexion slightly tighter than R; R hip ext tighter/more limited than L  LOWER EXTREMITY MMT:    MMT Right eval Left eval  Hip flexion 3+ 3+  Hip extension 3+ 4  Hip abduction 3+ 5  Hip adduction    Hip internal rotation    Hip external rotation    Knee flexion 5 5  Knee extension 5 5  Ankle dorsiflexion    Ankle plantarflexion    Ankle inversion    Ankle eversion     (Blank rows = not tested)  LUMBAR SPECIAL  TESTS:  Straight leg raise test: Positive and FABER test: Positive  GAIT: Distance walked: Into clinic Assistive device utilized: None Level of assistance: Complete Independence Comments: Normal reciprocal pattern  TREATMENT DATE: 04/15/24 See HEP                                                                                                                                 PATIENT EDUCATION:  Education details: Exam findings, POC, initial HEP Person educated: Patient Education method: Explanation, Demonstration, and Handouts Education comprehension: verbalized understanding, returned demonstration, and needs further education  HOME EXERCISE PROGRAM: Access Code: [email or web address] URL: [email or web address] Date: 04/15/2024 Prepared by: Analyse Angst April Marie Cristian Grieves  Exercises - Modified Thomas Stretch  - 1 x daily - 7 x weekly - 2 sets - 30 sec hold - Supine Bridge  - 1 x daily - 7 x weekly - 2 sets - 10 reps - 3-5 sec hold - Hooklying Isometric Clamshell  - 1 x daily - 7 x weekly - 2 sets - 10 reps - 3-5 sec hold  ASSESSMENT:  CLINICAL IMPRESSION: Patient is a 48 y.o. F who was seen today for physical therapy evaluation and treatment for low back pain. PMH significant for L4-L5 microdisckectomy. Assessment found pt to be in slight R posterior inominate rotation with very weak posterior hip musculature. Pt will greatly benefit from PT to improve her hip alignment to reduce her R low back/hip pain.   OBJECTIVE IMPAIRMENTS: decreased activity tolerance, decreased coordination, decreased endurance, decreased mobility, decreased ROM, decreased strength, increased fascial restrictions, increased muscle spasms, improper body mechanics, and pain.   ACTIVITY LIMITATIONS: sitting and standing  PARTICIPATION LIMITATIONS: driving, shopping, community activity, and occupation  PERSONAL FACTORS: Age, Fitness, Past/current experiences, and Time since onset of injury/illness/exacerbation  are also affecting patient's functional outcome.   REHAB POTENTIAL: Good  CLINICAL DECISION MAKING: Evolving/moderate complexity  EVALUATION COMPLEXITY: Moderate   GOALS: Goals reviewed with  patient? Yes  SHORT TERM GOALS: Target date: 05/06/2024   Pt will be ind with HEP Baseline: Goal status: INITIAL  2.  Pt will report reduced pain by >/=50% Baseline:  Goal status: INITIAL    LONG TERM GOALS: Target date: 05/27/2024   Pt will be ind with management and progression of HEP Baseline:  Goal status: INITIAL  2.  Pt will have improved Modified Oswestry to </=7/50 to demo MCID Baseline: 19 Goal status: INITIAL  3.  Pt will report improved overall pain by >/=75% Baseline:  Goal status: INITIAL  4.  Pt will demo improved R hip ext and abd to at least 4/5 for increased standing stability Baseline:  Goal status: INITIAL    PLAN:  PT FREQUENCY: 1-2x/week  PT DURATION: 6 weeks  PLANNED INTERVENTIONS: [id number]- PT Re-evaluation, 97750- Physical Performance Testing, 97110-Therapeutic exercises, 97530- Therapeutic activity, [id number]- Neuromuscular re-education, 97535- Self Care, [id number]- Manual therapy, [id number]- Gait training, [phone]- Aquatic Therapy, [phone]- Electrical stimulation (unattended), [id number]- Ultrasound, [id number]- Traction (mechanical), [id number] (1-2 muscles), [id number] (3+ muscles)- Dry Needling, Patient/Family education, Taping, Joint mobilization, Spinal mobilization, Cryotherapy, and Moist heat.  [employer] no [place]**  PLAN FOR NEXT SESSION: Assess response to HEP and modify accordingly. Decrease posterior SI rotation. Strengthen glutes and piriformis. Manual therapy/joint mobs as indicated.    Gertude Benito April Ma L Renay Crammer, PT, DPT 04/15/2024, 11:30 AM

## 2024-04-19 ENCOUNTER — Ambulatory Visit

## 2024-04-21 ENCOUNTER — Ambulatory Visit: Admitting: Physical Therapy

## 2024-04-21 ENCOUNTER — Encounter: Payer: Self-pay | Admitting: Physical Therapy

## 2024-04-21 DIAGNOSIS — M5459 Other low back pain: Secondary | ICD-10-CM

## 2024-04-21 DIAGNOSIS — M25551 Pain in right hip: Secondary | ICD-10-CM

## 2024-04-21 DIAGNOSIS — M6281 Muscle weakness (generalized): Secondary | ICD-10-CM

## 2024-04-21 DIAGNOSIS — M62838 Other muscle spasm: Secondary | ICD-10-CM

## 2024-04-21 NOTE — Therapy (Signed)
 OUTPATIENT PHYSICAL THERAPY TREATMENT   Patient Name: JERA HEADINGS MRN: [id number] DOB:01/31/1976, 48 y.o., female Today's Date: 04/21/2024  END OF SESSION:  PT End of Session - 04/21/24 1306     Visit Number 2    Number of Visits 12    Date for Recertification  05/27/24    Authorization Type [employer]etna    PT Start Time 1300    PT Stop Time 1340    PT Time Calculation (min) 40 min    Activity Tolerance Patient tolerated treatment well           Past Medical History:  Diagnosis Date   Cancer (HCC)    Vaginal   GERD (gastroesophageal reflux disease)    Headache    otc med prn   Heartburn    History of cervical dysplasia    History of uterine fibroid    Lipoma    Vaginal dysplasia    Past Surgical History:  Procedure Laterality Date   BIOPSY  02/09/2021   Procedure: BIOPSY;  Surgeon: Cindie Carlin POUR, DO;  [facility]: [facility];  Service: Endoscopy;;   CO2 LASER APPLICATION N/A 10/26/2020   Procedure: CO2 LASER APPLICATION OF VAGINA;  Surgeon: Viktoria Comer SAUNDERS, MD;  Location: [facility];  Service: Gynecology;  Laterality: N/A;   COLONOSCOPY WITH PROPOFOL  N/A 02/09/2021   Procedure: COLONOSCOPY WITH PROPOFOL ;  Surgeon: Cindie Carlin POUR, DO;  [facility]: [facility];  Service: Endoscopy;  Laterality: N/A;  10:00am   DILATION AND CURETTAGE OF UTERUS  08/05/2009  @[facility]    missed abortion   ESOPHAGOGASTRODUODENOSCOPY (EGD) WITH PROPOFOL  N/A 02/09/2021   Procedure: ESOPHAGOGASTRODUODENOSCOPY (EGD) WITH PROPOFOL ;  Surgeon: Cindie Carlin POUR, DO;  [facility]: [facility];  Service: Endoscopy;  Laterality: N/A;   LAPAROSCOPIC SALPINGO OOPHERECTOMY Right 08-28-2018  @[facility]    ovarian torsion   LAPAROSCOPIC TUBAL LIGATION W/ FILCHIE CLIPS Bilateral 03-20-2010  @[facility]    LUMBAR DISC SURGERY  01-30-2007  @[facility]    L4-- L5   LUMBAR LAMINECTOMY N/A 04/07/2023   Procedure: RIGHT LUMBAR 4-5 MICRODISCECTOMY;  Surgeon: Barbarann Oneil BROCKS, MD;  Location: [facility];  Service: Orthopedics;   Laterality: N/A;  Needs RNFA   ROBOTIC ASSISTED BILATERAL SALPINGO OOPHERECTOMY Bilateral 11/16/2014   Procedure: ROBOTIC ASSISTED BILATERAL SALPINGECTOMY;  Surgeon: Rosaline Luna, MD;  Location: [facility];  Service: Gynecology;  Laterality: Bilateral;   ROBOTIC ASSISTED TOTAL HYSTERECTOMY N/A 11/16/2014   w/ BS   VULVA MARYBETH BIOPSY N/A 10/26/2020   Procedure: VAGINAL BIOPSY;  Surgeon: Viktoria Comer SAUNDERS, MD;  Location: [facility];  Service: Gynecology;  Laterality: N/A;   WISDOM TOOTH EXTRACTION  1997   Patient Active Problem List   Diagnosis Date Noted   Pain in right ankle and joints of right foot 06/12/2023   S/P lumbar microdiscectomy 04/07/2023   Radiculopathy, lumbar region, right L5 01/01/2023   Pain of upper abdomen 01/01/2021   Gastroesophageal reflux disease 01/01/2021   Bacterial vaginosis 10/05/2020   Vaginal dysplasia 10/05/2020    PCP: Dow Longs, PA-C  REFERRING PROVIDER: Dow Longs, PA-C  REFERRING DIAG: Low Back Pain  Rationale for Evaluation and Treatment: Rehabilitation  THERAPY DIAG:  Pain in right hip  Other low back pain  Muscle weakness (generalized)  Other muscle spasm  ONSET DATE: Slowly over the Summer  SUBJECTIVE:  SUBJECTIVE STATEMENT: Has been trying to do the stretches at home. Has been doing the bridges and leg off the EOB.   PERTINENT HISTORY:  Microdiscectomy L4/L5 04/07/23  PAIN:  Are you having pain? Yes: NPRS scale: 5 currently, at worst 7 or 8 Pain location: R low back into posterior hip Pain description: Aching, sore Aggravating factors: Worse at the end of the day or lay on right side, prolonged sitting/standing Relieving factors: ibuprofen , ice, heat, exercise, meds  PRECAUTIONS: No lifting > 5 lbs  RED  FLAGS: None   WEIGHT BEARING RESTRICTIONS: No  FALLS:  Has patient fallen in last 6 months? No  LIVING ENVIRONMENT: Lives with: lives with their family Lives in: House/apartment Stairs: No Has following equipment at home: None  OCCUPATION: Desk work  PLOF: Independent  PATIENT GOALS: Decrease pain, sleep longer than 8 hours, improve movement, less difficulty with home/work activities, stand/sit longer  NEXT MD VISIT: n/a  OBJECTIVE:  Note: Objective measures were completed at Evaluation unless otherwise noted.  DIAGNOSTIC FINDINGS:  No new imaging  PATIENT SURVEYS:  Modified Oswestry:  MODIFIED OSWESTRY DISABILITY SCALE  Date: 04/15/24 Score  Pain intensity 3 =  Pain medication provides me with moderate relief from pain.  2. Personal care (washing, dressing, etc.) 1 =  I can take care of myself normally, but it increases my pain.  3. Lifting 4 = I can lift only very light weights  4. Walking 0 = Pain does not prevent me from walking any distance  5. Sitting 2 =  Pain prevents me from sitting more than 1 hour.  6. Standing 3 =  Pain prevents me from standing more than 1/2 hour.  7. Sleeping 1 = I can sleep well only by using pain medication.  8. Social Life 2 = Pain prevents me from participating in more energetic activities (eg. sports, dancing).  9. Traveling 1 =  I can travel anywhere, but it increases my pain.  10. Employment/ Homemaking 2 = I can perform most of my homemaking/job duties, but pain prevents me from performing more physically stressful activities (eg, lifting, vacuuming).  Total 19/50   Interpretation of scores: Score Category Description  0-20% Minimal Disability The patient can cope with most living activities. Usually no treatment is indicated apart from advice on lifting, sitting and exercise  21-40% Moderate Disability The patient experiences more pain and difficulty with sitting, lifting and standing. Travel and social life are more difficult and  they may be disabled from work. Personal care, sexual activity and sleeping are not grossly affected, and the patient can usually be managed by conservative means  41-60% Severe Disability Pain remains the main problem in this group, but activities of daily living are affected. These patients require a detailed investigation  61-80% Crippled Back pain impinges on all aspects of the patient's life. Positive intervention is required  81-100% Bed-bound These patients are either bed-bound or exaggerating their symptoms  Bluford FORBES Zoe DELENA Karon DELENA, et al. Surgery versus conservative management of stable thoracolumbar fracture: the PRESTO feasibility RCT. [place] ([place]): [employer]; 2021 Nov. [facility] Technology Assessment, No. 25.62.) Appendix 3, Oswestry Disability Index category descriptors. Available from: Findjewelers.cz  Minimally Clinically Important Difference (MCID) = 12.8%  COGNITION: Overall cognitive status: Within functional limits for tasks assessed     SENSATION: WFL  MUSCLE LENGTH: Hamstrings: WNL  POSTURE: No Significant postural limitations  PALPATION: TTP R piriformis Felt good with PA mob on R SIJ for anterior inominate rotation  Felt good with R LE distraction  LOWER EXTREMITY ROM:   L hip flexion slightly tighter than R; R hip ext tighter/more limited than L  LOWER EXTREMITY MMT:    MMT Right eval Left eval  Hip flexion 3+ 3+  Hip extension 3+ 4  Hip abduction 3+ 5  Hip adduction    Hip internal rotation    Hip external rotation    Knee flexion 5 5  Knee extension 5 5  Ankle dorsiflexion    Ankle plantarflexion    Ankle inversion    Ankle eversion     (Blank rows = not tested)  LUMBAR SPECIAL TESTS:  Straight leg raise test: Positive and FABER test: Positive  GAIT: Distance walked: Into clinic Assistive device utilized: None Level of assistance: Complete Independence Comments: Normal reciprocal  pattern  TREATMENT DATE:  04/21/24 Elliptical L1 x 6 min LEs Posterior inominate MET 5x5 Supine LTR 3x30 Supine figure 4 stretch x30 Supine bridge x10 Supine bridge with ball squeeze x10 Supine clamshell iso against belt 10x5 Supine PPT 10x5 Modalities: Premod e-stim x 15 min along lumbosacral paraspinals with moist heat  04/15/24 See HEP      PATIENT EDUCATION:  Education details: Exam findings, POC, initial HEP Person educated: Patient Education method: Explanation, Demonstration, and Handouts Education comprehension: verbalized understanding, returned demonstration, and needs further education  HOME EXERCISE PROGRAM: Access Code: [email or web address] URL: [email or web address] Date: 04/15/2024 Prepared by: Maecyn Panning April Earnie Starring  Exercises - Modified Debby Stretch  - 1 x daily - 7 x weekly - 2 sets - 30 sec hold - Supine Bridge  - 1 x daily - 7 x weekly - 2 sets - 10 reps - 3-5 sec hold - Hooklying Isometric Clamshell  - 1 x daily - 7 x weekly - 2 sets - 10 reps - 3-5 sec hold  ASSESSMENT:  CLINICAL IMPRESSION: Treatment focused on improving inominate rotation and increase lumbopelvic stability and core strength. Responded well to e-stim and heat for pain management.   OBJECTIVE IMPAIRMENTS: decreased activity tolerance, decreased coordination, decreased endurance, decreased mobility, decreased ROM, decreased strength, increased fascial restrictions, increased muscle spasms, improper body mechanics, and pain.     GOALS: Goals reviewed with patient? Yes  SHORT TERM GOALS: Target date: 05/06/2024   Pt will be ind with HEP Baseline: Goal status: INITIAL  2.  Pt will report reduced pain by >/=50% Baseline:  Goal status: INITIAL    LONG TERM GOALS: Target date: 05/27/2024   Pt will be ind with management and progression of HEP Baseline:  Goal status: INITIAL  2.  Pt will have improved Modified Oswestry to </=7/50 to demo MCID Baseline:  19 Goal status: INITIAL  3.  Pt will report improved overall pain by >/=75% Baseline:  Goal status: INITIAL  4.  Pt will demo improved R hip ext and abd to at least 4/5 for increased standing stability Baseline:  Goal status: INITIAL    PLAN:  PT FREQUENCY: 1-2x/week  PT DURATION: 6 weeks  PLANNED INTERVENTIONS: 97164- PT Re-evaluation, 97750- Physical Performance Testing, 97110-Therapeutic exercises, 97530- Therapeutic activity, [id number]- Neuromuscular re-education, 97535- Self Care, [id number]- Manual therapy, [id number]- Gait training, [phone]- Aquatic Therapy, [id number]- Electrical stimulation (unattended), [id number]- Ultrasound, [id number]- Traction (mechanical), [id number] (1-2 muscles), [id number] (3+ muscles)- Dry Needling, Patient/Family education, Taping, Joint mobilization, Spinal mobilization, Cryotherapy, and Moist heat.  [employer] no Ionto**  PLAN FOR NEXT SESSION: Assess response to HEP and modify accordingly. Decrease posterior SI rotation. Strengthen glutes and piriformis. Manual therapy/joint mobs  as indicated.    Nautia Lem April Ma L Jamyla Ard, PT, DPT 04/21/2024, 1:06 PM

## 2024-04-26 ENCOUNTER — Encounter: Payer: Self-pay | Admitting: Radiology

## 2024-04-27 ENCOUNTER — Ambulatory Visit: Attending: Sports Medicine

## 2024-04-27 DIAGNOSIS — M5459 Other low back pain: Secondary | ICD-10-CM | POA: Diagnosis present

## 2024-04-27 DIAGNOSIS — M25551 Pain in right hip: Secondary | ICD-10-CM | POA: Diagnosis present

## 2024-04-27 DIAGNOSIS — M62838 Other muscle spasm: Secondary | ICD-10-CM | POA: Diagnosis present

## 2024-04-27 DIAGNOSIS — M6281 Muscle weakness (generalized): Secondary | ICD-10-CM | POA: Insufficient documentation

## 2024-04-27 NOTE — Therapy (Signed)
 OUTPATIENT PHYSICAL THERAPY TREATMENT   Patient Name: Maria Velasquez MRN: [id number] DOB:January 02, 1976, 48 y.o., female Today's Date: 04/27/2024  END OF SESSION:  PT End of Session - 04/27/24 0807     Visit Number 3    Number of Visits 12    Date for Recertification  05/27/24    Authorization Type Aetna    PT Start Time 0800    PT Stop Time 0854    PT Time Calculation (min) 54 min    Activity Tolerance Patient tolerated treatment well           Past Medical History:  Diagnosis Date   Cancer (HCC)    Vaginal   GERD (gastroesophageal reflux disease)    Headache    otc med prn   Heartburn    History of cervical dysplasia    History of uterine fibroid    Lipoma    Vaginal dysplasia    Past Surgical History:  Procedure Laterality Date   BIOPSY  02/09/2021   Procedure: BIOPSY;  Surgeon: Cindie Carlin POUR, DO;  [facility];  Service: Endoscopy;;   CO2 LASER APPLICATION N/A 10/26/2020   Procedure: CO2 LASER APPLICATION OF VAGINA;  Surgeon: Viktoria Comer SAUNDERS, MD;  Location: [facility];  Service: Gynecology;  Laterality: N/A;   COLONOSCOPY WITH PROPOFOL  N/A 02/09/2021   Procedure: COLONOSCOPY WITH PROPOFOL ;  Surgeon: Cindie Carlin POUR, DO;  [facility];  Service: Endoscopy;  Laterality: N/A;  10:00am   DILATION AND CURETTAGE OF UTERUS  08/05/2009  @[facility]    missed abortion   ESOPHAGOGASTRODUODENOSCOPY (EGD) WITH PROPOFOL  N/A 02/09/2021   Procedure: ESOPHAGOGASTRODUODENOSCOPY (EGD) WITH PROPOFOL ;  Surgeon: Cindie Carlin POUR, DO;  [facility];  Service: Endoscopy;  Laterality: N/A;   LAPAROSCOPIC SALPINGO OOPHERECTOMY Right 08-28-2018  @[facility]    ovarian torsion   LAPAROSCOPIC TUBAL LIGATION W/ FILCHIE CLIPS Bilateral 03-20-2010  @[facility]    LUMBAR DISC SURGERY  01-30-2007  @[facility]    L4-- L5   LUMBAR LAMINECTOMY N/A 04/07/2023   Procedure: RIGHT LUMBAR 4-5 MICRODISCECTOMY;  Surgeon: Barbarann Oneil BROCKS, MD;  Location: [facility];  Service: Orthopedics;   Laterality: N/A;  Needs RNFA   ROBOTIC ASSISTED BILATERAL SALPINGO OOPHERECTOMY Bilateral 11/16/2014   Procedure: ROBOTIC ASSISTED BILATERAL SALPINGECTOMY;  Surgeon: Rosaline Luna, MD;  Location: [facility];  Service: Gynecology;  Laterality: Bilateral;   ROBOTIC ASSISTED TOTAL HYSTERECTOMY N/A 11/16/2014   w/ BS   VULVA MARYBETH BIOPSY N/A 10/26/2020   Procedure: VAGINAL BIOPSY;  Surgeon: Viktoria Comer SAUNDERS, MD;  Location: [facility];  Service: Gynecology;  Laterality: N/A;   WISDOM TOOTH EXTRACTION  1997   Patient Active Problem List   Diagnosis Date Noted   Pain in right ankle and joints of right foot 06/12/2023   S/P lumbar microdiscectomy 04/07/2023   Radiculopathy, lumbar region, right L5 01/01/2023   Pain of upper abdomen 01/01/2021   Gastroesophageal reflux disease 01/01/2021   Bacterial vaginosis 10/05/2020   Vaginal dysplasia 10/05/2020    PCP: Dow Longs, PA-C  REFERRING PROVIDER: Dow Longs, PA-C  REFERRING DIAG: Low Back Pain  Rationale for Evaluation and Treatment: Rehabilitation  THERAPY DIAG:  Pain in right hip  Other low back pain  Muscle weakness (generalized)  Other muscle spasm  ONSET DATE: Slowly over the Summer  SUBJECTIVE:  SUBJECTIVE STATEMENT: Pt report very minimal right low back soreness this morning.   PERTINENT HISTORY:  Microdiscectomy L4/L5 04/07/23  PAIN:  Are you having pain? Yes: NPRS scale: very minimal Pain location: R low back into posterior hip Pain description: Aching, sore Aggravating factors: Worse at the end of the day or lay on right side, prolonged sitting/standing Relieving factors: ibuprofen , ice, heat, exercise, meds  PRECAUTIONS: No lifting > 5 lbs  RED FLAGS: None   WEIGHT BEARING RESTRICTIONS: No  FALLS:   Has patient fallen in last 6 months? No  LIVING ENVIRONMENT: Lives with: lives with their family Lives in: House/apartment Stairs: No Has following equipment at home: None  OCCUPATION: Desk work  PLOF: Independent  PATIENT GOALS: Decrease pain, sleep longer than 8 hours, improve movement, less difficulty with home/work activities, stand/sit longer  NEXT MD VISIT: n/a  OBJECTIVE:  Note: Objective measures were completed at Evaluation unless otherwise noted.  DIAGNOSTIC FINDINGS:  No new imaging  PATIENT SURVEYS:  Modified Oswestry:  MODIFIED OSWESTRY DISABILITY SCALE  Date: 04/15/24 Score  Pain intensity 3 =  Pain medication provides me with moderate relief from pain.  2. Personal care (washing, dressing, etc.) 1 =  I can take care of myself normally, but it increases my pain.  3. Lifting 4 = I can lift only very light weights  4. Walking 0 = Pain does not prevent me from walking any distance  5. Sitting 2 =  Pain prevents me from sitting more than 1 hour.  6. Standing 3 =  Pain prevents me from standing more than 1/2 hour.  7. Sleeping 1 = I can sleep well only by using pain medication.  8. Social Life 2 = Pain prevents me from participating in more energetic activities (eg. sports, dancing).  9. Traveling 1 =  I can travel anywhere, but it increases my pain.  10. Employment/ Homemaking 2 = I can perform most of my homemaking/job duties, but pain prevents me from performing more physically stressful activities (eg, lifting, vacuuming).  Total 19/50   Interpretation of scores: Score Category Description  0-20% Minimal Disability The patient can cope with most living activities. Usually no treatment is indicated apart from advice on lifting, sitting and exercise  21-40% Moderate Disability The patient experiences more pain and difficulty with sitting, lifting and standing. Travel and social life are more difficult and they may be disabled from work. Personal care, sexual  activity and sleeping are not grossly affected, and the patient can usually be managed by conservative means  41-60% Severe Disability Pain remains the main problem in this group, but activities of daily living are affected. These patients require a detailed investigation  61-80% Crippled Back pain impinges on all aspects of the patient's life. Positive intervention is required  81-100% Bed-bound These patients are either bed-bound or exaggerating their symptoms  Bluford FORBES Zoe DELENA Karon DELENA, et al. Surgery versus conservative management of stable thoracolumbar fracture: the PRESTO feasibility RCT. [place] ([place]): [employer]; 2021 Nov. [facility] Technology Assessment, No. 25.62.) Appendix 3, Oswestry Disability Index category descriptors. Available from: Findjewelers.cz  Minimally Clinically Important Difference (MCID) = 12.8%  COGNITION: Overall cognitive status: Within functional limits for tasks assessed     SENSATION: WFL  MUSCLE LENGTH: Hamstrings: WNL  POSTURE: No Significant postural limitations  PALPATION: TTP R piriformis Felt good with PA mob on R SIJ for anterior inominate rotation Felt good with R LE distraction  LOWER EXTREMITY ROM:   L hip  flexion slightly tighter than R; R hip ext tighter/more limited than L  LOWER EXTREMITY MMT:    MMT Right eval Left eval  Hip flexion 3+ 3+  Hip extension 3+ 4  Hip abduction 3+ 5  Hip adduction    Hip internal rotation    Hip external rotation    Knee flexion 5 5  Knee extension 5 5  Ankle dorsiflexion    Ankle plantarflexion    Ankle inversion    Ankle eversion     (Blank rows = not tested)  LUMBAR SPECIAL TESTS:  Straight leg raise test: Positive and FABER test: Positive  GAIT: Distance walked: Into clinic Assistive device utilized: None Level of assistance: Complete Independence Comments: Normal reciprocal pattern  TREATMENT DATE:   04/27/24                                  EXERCISE LOG  Exercise Repetitions and Resistance Comments  Elliptical  Lvl 2 x 8 mins (forward/backward)   Rockerboard 3 mins   Standing Hip Abduction 2# 2 sets 10 reps bil   Standing Hip Extension 2# 2 sets 10 reps bil   Bridge w/Abduction X 20 reps w 3 sec hold    Blank cell = exercise not performed today   Modalities  Date:  Unattended Estim: Lumbar, IFC 80-150 Hz, 15 mins, Pain and Tone Hot Pack: Lumbar, 15 mins, Pain and Tone   04/21/24 Elliptical L1 x 6 min LEs Posterior inominate MET 5x5 Supine LTR 3x30 Supine figure 4 stretch x30 Supine bridge x10 Supine bridge with ball squeeze x10 Supine clamshell iso against belt 10x5 Supine PPT 10x5 Modalities: Premod e-stim x 15 min along lumbosacral paraspinals with moist heat  04/15/24 See HEP      PATIENT EDUCATION:  Education details: Exam findings, POC, initial HEP Person educated: Patient Education method: Explanation, Demonstration, and Handouts Education comprehension: verbalized understanding, returned demonstration, and needs further education  HOME EXERCISE PROGRAM: Access Code: [email or web address] URL: [email or web address] Date: 04/15/2024 Prepared by: Gellen April Earnie Starring  Exercises - Modified Thomas Stretch  - 1 x daily - 7 x weekly - 2 sets - 30 sec hold - Supine Bridge  - 1 x daily - 7 x weekly - 2 sets - 10 reps - 3-5 sec hold - Hooklying Isometric Clamshell  - 1 x daily - 7 x weekly - 2 sets - 10 reps - 3-5 sec hold  ASSESSMENT:  CLINICAL IMPRESSION: Pt arrives for today's treatment session reporting very minimal right lumbar and hip pain.  Pt able to tolerate increased time on elliptical today with fatigue noted.  Pt introduced to standing hip exercises today with min cues required to avoid compensatory leaning with each rep.  Discussed with pt at length the importance of proper posture when standing or seated while working.  Normal responses to estim and Mh noted upon removal.  Pt  denied any pain at completion of today's treatment session.  OBJECTIVE IMPAIRMENTS: decreased activity tolerance, decreased coordination, decreased endurance, decreased mobility, decreased ROM, decreased strength, increased fascial restrictions, increased muscle spasms, improper body mechanics, and pain.     GOALS: Goals reviewed with patient? Yes  SHORT TERM GOALS: Target date: 05/06/2024   Pt will be ind with HEP Baseline: Goal status: INITIAL  2.  Pt will report reduced pain by >/=50% Baseline:  Goal status: INITIAL    LONG TERM GOALS: Target date: 05/27/2024  Pt will be ind with management and progression of HEP Baseline:  Goal status: INITIAL  2.  Pt will have improved Modified Oswestry to </=7/50 to demo MCID Baseline: 19 Goal status: INITIAL  3.  Pt will report improved overall pain by >/=75% Baseline:  Goal status: INITIAL  4.  Pt will demo improved R hip ext and abd to at least 4/5 for increased standing stability Baseline:  Goal status: INITIAL    PLAN:  PT FREQUENCY: 1-2x/week  PT DURATION: 6 weeks  PLANNED INTERVENTIONS: 97164- PT Re-evaluation, 97750- Physical Performance Testing, 97110-Therapeutic exercises, 97530- Therapeutic activity, [id number]- Neuromuscular re-education, 97535- Self Care, 02859- Manual therapy, [id number]- Gait training, [phone]- Aquatic Therapy, [phone]- Electrical stimulation (unattended), [id number]- Ultrasound, [id number]- Traction (mechanical), [id number] (1-2 muscles), 20561 (3+ muscles)- Dry Needling, Patient/Family education, Taping, Joint mobilization, Spinal mobilization, Cryotherapy, and Moist heat.  [employer] no Ionto**  PLAN FOR NEXT SESSION: Assess response to HEP and modify accordingly. Decrease posterior SI rotation. Strengthen glutes and piriformis. Manual therapy/joint mobs as indicated.    Maria Velasquez, PTA 04/27/2024, 9:00 AM

## 2024-04-30 ENCOUNTER — Ambulatory Visit

## 2024-04-30 DIAGNOSIS — M25551 Pain in right hip: Secondary | ICD-10-CM | POA: Diagnosis not present

## 2024-04-30 DIAGNOSIS — M62838 Other muscle spasm: Secondary | ICD-10-CM

## 2024-04-30 DIAGNOSIS — M6281 Muscle weakness (generalized): Secondary | ICD-10-CM

## 2024-04-30 DIAGNOSIS — M5459 Other low back pain: Secondary | ICD-10-CM

## 2024-04-30 NOTE — Therapy (Signed)
 OUTPATIENT PHYSICAL THERAPY TREATMENT   Patient Name: Maria Velasquez MRN: [id number] DOB:[birth year], 48 y.o., female Today's Date: 04/30/2024  END OF SESSION:  PT End of Session - 04/30/24 1147     Visit Number 4    Number of Visits 12    Date for Recertification  05/27/24    Authorization Type Aetna    PT Start Time 1145    PT Stop Time 1238    PT Time Calculation (min) 53 min    Activity Tolerance Patient tolerated treatment well           Past Medical History:  Diagnosis Date   Cancer (HCC)    Vaginal   GERD (gastroesophageal reflux disease)    Headache    otc med prn   Heartburn    History of cervical dysplasia    History of uterine fibroid    Lipoma    Vaginal dysplasia    Past Surgical History:  Procedure Laterality Date   BIOPSY  02/09/2021   Procedure: BIOPSY;  Surgeon: Cindie Carlin POUR, DO;  [facility]: [facility];  Service: Endoscopy;;   CO2 LASER APPLICATION N/A 10/26/2020   Procedure: CO2 LASER APPLICATION OF VAGINA;  Surgeon: Viktoria Comer SAUNDERS, MD;  Location: [facility] [place];  Service: Gynecology;  Laterality: N/A;   COLONOSCOPY WITH PROPOFOL  N/A 02/09/2021   Procedure: COLONOSCOPY WITH PROPOFOL ;  Surgeon: Cindie Carlin POUR, DO;  [facility]: [facility];  Service: Endoscopy;  Laterality: N/A;  10:00am   DILATION AND CURETTAGE OF UTERUS  08/05/2009  @[facility]    missed abortion   ESOPHAGOGASTRODUODENOSCOPY (EGD) WITH PROPOFOL  N/A 02/09/2021   Procedure: ESOPHAGOGASTRODUODENOSCOPY (EGD) WITH PROPOFOL ;  Surgeon: Cindie Carlin POUR, DO;  [facility]: [facility];  Service: Endoscopy;  Laterality: N/A;   LAPAROSCOPIC SALPINGO OOPHERECTOMY Right 08-28-2018  @[facility]    ovarian torsion   LAPAROSCOPIC TUBAL LIGATION W/ FILCHIE CLIPS Bilateral 03-20-2010  @[facility]    LUMBAR DISC SURGERY  01-30-2007  @[facility]    L4-- L5   LUMBAR LAMINECTOMY N/A 04/07/2023   Procedure: RIGHT LUMBAR 4-5 MICRODISCECTOMY;  Surgeon: Barbarann Oneil BROCKS, MD;  Location: [facility];  Service: Orthopedics;   Laterality: N/A;  Needs RNFA   ROBOTIC ASSISTED BILATERAL SALPINGO OOPHERECTOMY Bilateral 11/16/2014   Procedure: ROBOTIC ASSISTED BILATERAL SALPINGECTOMY;  Surgeon: Rosaline Luna, MD;  Location: [facility];  Service: Gynecology;  Laterality: Bilateral;   ROBOTIC ASSISTED TOTAL HYSTERECTOMY N/A 11/16/2014   w/ BS   VULVA MARYBETH BIOPSY N/A 10/26/2020   Procedure: VAGINAL BIOPSY;  Surgeon: Viktoria Comer SAUNDERS, MD;  Location: [facility];  Service: Gynecology;  Laterality: N/A;   WISDOM TOOTH EXTRACTION  1997   Patient Active Problem List   Diagnosis Date Noted   Pain in right ankle and joints of right foot 06/12/2023   S/P lumbar microdiscectomy 04/07/2023   Radiculopathy, lumbar region, right L5 01/01/2023   Pain of upper abdomen 01/01/2021   Gastroesophageal reflux disease 01/01/2021   Bacterial vaginosis 10/05/2020   Vaginal dysplasia 10/05/2020    PCP: Dow Longs, PA-C  REFERRING PROVIDER: Dow Longs, PA-C  REFERRING DIAG: Low Back Pain  Rationale for Evaluation and Treatment: Rehabilitation  THERAPY DIAG:  Pain in right hip  Other low back pain  Muscle weakness (generalized)  Other muscle spasm  ONSET DATE: Slowly over the Summer  SUBJECTIVE:  SUBJECTIVE STATEMENT: Pt report 7/10 right low back pain today.   PERTINENT HISTORY:  Microdiscectomy L4/L5 04/07/23  PAIN:  Are you having pain? Yes: NPRS scale: 7/10 Pain location: R low back into posterior hip Pain description: Aching, sore Aggravating factors: Worse at the end of the day or lay on right side, prolonged sitting/standing Relieving factors: ibuprofen , ice, heat, exercise, meds  PRECAUTIONS: No lifting > 5 lbs  RED FLAGS: None   WEIGHT BEARING RESTRICTIONS: No  FALLS:  Has patient fallen in last  6 months? No  LIVING ENVIRONMENT: Lives with: lives with their family Lives in: House/apartment Stairs: No Has following equipment at home: None  OCCUPATION: Desk work  PLOF: Independent  PATIENT GOALS: Decrease pain, sleep longer than 8 hours, improve movement, less difficulty with home/work activities, stand/sit longer  NEXT MD VISIT: n/a  OBJECTIVE:  Note: Objective measures were completed at Evaluation unless otherwise noted.  DIAGNOSTIC FINDINGS:  No new imaging  PATIENT SURVEYS:  Modified Oswestry:  MODIFIED OSWESTRY DISABILITY SCALE  Date: 04/15/24 Score  Pain intensity 3 =  Pain medication provides me with moderate relief from pain.  2. Personal care (washing, dressing, etc.) 1 =  I can take care of myself normally, but it increases my pain.  3. Lifting 4 = I can lift only very light weights  4. Walking 0 = Pain does not prevent me from walking any distance  5. Sitting 2 =  Pain prevents me from sitting more than 1 hour.  6. Standing 3 =  Pain prevents me from standing more than 1/2 hour.  7. Sleeping 1 = I can sleep well only by using pain medication.  8. Social Life 2 = Pain prevents me from participating in more energetic activities (eg. sports, dancing).  9. Traveling 1 =  I can travel anywhere, but it increases my pain.  10. Employment/ Homemaking 2 = I can perform most of my homemaking/job duties, but pain prevents me from performing more physically stressful activities (eg, lifting, vacuuming).  Total 19/50   Interpretation of scores: Score Category Description  0-20% Minimal Disability The patient can cope with most living activities. Usually no treatment is indicated apart from advice on lifting, sitting and exercise  21-40% Moderate Disability The patient experiences more pain and difficulty with sitting, lifting and standing. Travel and social life are more difficult and they may be disabled from work. Personal care, sexual activity and sleeping are not  grossly affected, and the patient can usually be managed by conservative means  41-60% Severe Disability Pain remains the main problem in this group, but activities of daily living are affected. These patients require a detailed investigation  61-80% Crippled Back pain impinges on all aspects of the patient's life. Positive intervention is required  81-100% Bed-bound These patients are either bed-bound or exaggerating their symptoms  Bluford FORBES Zoe DELENA Karon DELENA, et al. Surgery versus conservative management of stable thoracolumbar fracture: the PRESTO feasibility RCT. [place] ([place]): [employer]; 2021 Nov. [facility] Technology Assessment, No. 25.62.) Appendix 3, Oswestry Disability Index category descriptors. Available from: Findjewelers.cz  Minimally Clinically Important Difference (MCID) = 12.8%  COGNITION: Overall cognitive status: Within functional limits for tasks assessed     SENSATION: WFL  MUSCLE LENGTH: Hamstrings: WNL  POSTURE: No Significant postural limitations  PALPATION: TTP R piriformis Felt good with PA mob on R SIJ for anterior inominate rotation Felt good with R LE distraction  LOWER EXTREMITY ROM:   L hip flexion slightly tighter  than R; R hip ext tighter/more limited than L  LOWER EXTREMITY MMT:    MMT Right eval Left eval  Hip flexion 3+ 3+  Hip extension 3+ 4  Hip abduction 3+ 5  Hip adduction    Hip internal rotation    Hip external rotation    Knee flexion 5 5  Knee extension 5 5  Ankle dorsiflexion    Ankle plantarflexion    Ankle inversion    Ankle eversion     (Blank rows = not tested)  LUMBAR SPECIAL TESTS:  Straight leg raise test: Positive and FABER test: Positive  GAIT: Distance walked: Into clinic Assistive device utilized: None Level of assistance: Complete Independence Comments: Normal reciprocal pattern  TREATMENT DATE:   04/30/24                                 EXERCISE  LOG  Exercise Repetitions and Resistance Comments  Elliptical  Lvl 2 x 10 mins (forward/backward)   Rockerboard    Standing Hip Abduction    Standing Hip Extension    Bridge w/Abduction     Blank cell = exercise not performed today   Manual Therapy Soft Tissue Mobilization: Right glut and hip, STW/M to right glut and SI region as well as right hip in area of greater trochanter with pt in left side-lying for comfort.     Modalities  Date:  Unattended Estim: Lumbar, IFC 80-150 Hz, 15 mins, Pain and Tone Hot Pack: Lumbar, 15 mins, Pain and Tone   04/21/24 Elliptical L1 x 6 min LEs Posterior inominate MET 5x5 Supine LTR 3x30 Supine figure 4 stretch x30 Supine bridge x10 Supine bridge with ball squeeze x10 Supine clamshell iso against belt 10x5 Supine PPT 10x5 Modalities: Premod e-stim x 15 min along lumbosacral paraspinals with moist heat  04/15/24 See HEP      PATIENT EDUCATION:  Education details: Exam findings, POC, initial HEP Person educated: Patient Education method: Explanation, Demonstration, and Handouts Education comprehension: verbalized understanding, returned demonstration, and needs further education  HOME EXERCISE PROGRAM: Access Code: [email or web address] URL: [email or web address] Date: 04/15/2024 Prepared by: Gellen April Marie Nonato  Exercises - Modified Thomas Stretch  - 1 x daily - 7 x weekly - 2 sets - 30 sec hold - Supine Bridge  - 1 x daily - 7 x weekly - 2 sets - 10 reps - 3-5 sec hold - Hooklying Isometric Clamshell  - 1 x daily - 7 x weekly - 2 sets - 10 reps - 3-5 sec hold  ASSESSMENT:  CLINICAL IMPRESSION: Pt arrives for today's treatment session reporting 7/10 low back pain.  Pt states she were wedges to a school event last night and it made her back pain increase.  Pt able to tolerate increased time on the elliptical today without increased discomfort.  STW/M performed to right glut and hip musculature to decrease pain and tone.   Normal responses to estim and Mh noted upon removal.  Pt reported 5/10 low back pain at completion of today's treatment session.   OBJECTIVE IMPAIRMENTS: decreased activity tolerance, decreased coordination, decreased endurance, decreased mobility, decreased ROM, decreased strength, increased fascial restrictions, increased muscle spasms, improper body mechanics, and pain.     GOALS: Goals reviewed with patient? Yes  SHORT TERM GOALS: Target date: 05/06/2024   Pt will be ind with HEP Baseline: Goal status: INITIAL  2.  Pt will report reduced pain by >/=50% Baseline:  Goal status: INITIAL    LONG TERM GOALS: Target date: 05/27/2024   Pt will be ind with management and progression of HEP Baseline:  Goal status: INITIAL  2.  Pt will have improved Modified Oswestry to </=7/50 to demo MCID Baseline: 19 Goal status: INITIAL  3.  Pt will report improved overall pain by >/=75% Baseline:  Goal status: INITIAL  4.  Pt will demo improved R hip ext and abd to at least 4/5 for increased standing stability Baseline:  Goal status: INITIAL    PLAN:  PT FREQUENCY: 1-2x/week  PT DURATION: 6 weeks  PLANNED INTERVENTIONS: 97164- PT Re-evaluation, 97750- Physical Performance Testing, 97110-Therapeutic exercises, 97530- Therapeutic activity, [id number]- Neuromuscular re-education, 97535- Self Care, 02859- Manual therapy, [id number]- Gait training, [phone]- Aquatic Therapy, [id number]- Electrical stimulation (unattended), [id number]- Ultrasound, [id number]- Traction (mechanical), [id number] (1-2 muscles), [id number] (3+ muscles)- Dry Needling, Patient/Family education, Taping, Joint mobilization, Spinal mobilization, Cryotherapy, and Moist heat.  [employer] no Ionto**  PLAN FOR NEXT SESSION: Assess response to HEP and modify accordingly. Decrease posterior SI rotation. Strengthen glutes and piriformis. Manual therapy/joint mobs as indicated.    Delon DELENA Gosling, PTA 04/30/2024, 12:38 PM

## 2024-05-03 ENCOUNTER — Ambulatory Visit

## 2024-05-03 DIAGNOSIS — M6281 Muscle weakness (generalized): Secondary | ICD-10-CM

## 2024-05-03 DIAGNOSIS — M25551 Pain in right hip: Secondary | ICD-10-CM | POA: Diagnosis not present

## 2024-05-03 DIAGNOSIS — M5459 Other low back pain: Secondary | ICD-10-CM

## 2024-05-03 DIAGNOSIS — M62838 Other muscle spasm: Secondary | ICD-10-CM

## 2024-05-03 NOTE — Therapy (Signed)
 OUTPATIENT PHYSICAL THERAPY TREATMENT   Patient Name: KERIGAN NARVAEZ MRN: [id number] DOB:03-21-1976, 48 y.o., female Today's Date: 05/03/2024  END OF SESSION:  PT End of Session - 05/03/24 1106     Visit Number 5    Number of Visits 12    Date for Recertification  05/27/24    Authorization Type Aetna    PT Start Time 1100    PT Stop Time 1158    PT Time Calculation (min) 58 min    Activity Tolerance Patient tolerated treatment well           Past Medical History:  Diagnosis Date   Cancer (HCC)    Vaginal   GERD (gastroesophageal reflux disease)    Headache    otc med prn   Heartburn    History of cervical dysplasia    History of uterine fibroid    Lipoma    Vaginal dysplasia    Past Surgical History:  Procedure Laterality Date   BIOPSY  02/09/2021   Procedure: BIOPSY;  Surgeon: Cindie Carlin POUR, DO;  [facility];  Service: Endoscopy;;   CO2 LASER APPLICATION N/A 10/26/2020   Procedure: CO2 LASER APPLICATION OF VAGINA;  Surgeon: Viktoria Comer SAUNDERS, MD;  Location: [facility];  Service: Gynecology;  Laterality: N/A;   COLONOSCOPY WITH PROPOFOL  N/A 02/09/2021   Procedure: COLONOSCOPY WITH PROPOFOL ;  Surgeon: Cindie Carlin POUR, DO;  [facility]: [facility];  Service: Endoscopy;  Laterality: N/A;  10:00am   DILATION AND CURETTAGE OF UTERUS  08/05/2009  @[facility]    missed abortion   ESOPHAGOGASTRODUODENOSCOPY (EGD) WITH PROPOFOL  N/A 02/09/2021   Procedure: ESOPHAGOGASTRODUODENOSCOPY (EGD) WITH PROPOFOL ;  Surgeon: Cindie Carlin POUR, DO;  [facility];  Service: Endoscopy;  Laterality: N/A;   LAPAROSCOPIC SALPINGO OOPHERECTOMY Right 08-28-2018  @[facility]    ovarian torsion   LAPAROSCOPIC TUBAL LIGATION W/ FILCHIE CLIPS Bilateral 03-20-2010  @[facility]    LUMBAR DISC SURGERY  01-30-2007  @[facility]    L4-- L5   LUMBAR LAMINECTOMY N/A 04/07/2023   Procedure: RIGHT LUMBAR 4-5 MICRODISCECTOMY;  Surgeon: Barbarann Oneil BROCKS, MD;  Location: [facility];  Service: Orthopedics;   Laterality: N/A;  Needs RNFA   ROBOTIC ASSISTED BILATERAL SALPINGO OOPHERECTOMY Bilateral 11/16/2014   Procedure: ROBOTIC ASSISTED BILATERAL SALPINGECTOMY;  Surgeon: Rosaline Luna, MD;  Location: [facility];  Service: Gynecology;  Laterality: Bilateral;   ROBOTIC ASSISTED TOTAL HYSTERECTOMY N/A 11/16/2014   w/ BS   VULVA MARYBETH BIOPSY N/A 10/26/2020   Procedure: VAGINAL BIOPSY;  Surgeon: Viktoria Comer SAUNDERS, MD;  Location: [facility];  Service: Gynecology;  Laterality: N/A;   WISDOM TOOTH EXTRACTION  1997   Patient Active Problem List   Diagnosis Date Noted   Pain in right ankle and joints of right foot 06/12/2023   S/P lumbar microdiscectomy 04/07/2023   Radiculopathy, lumbar region, right L5 01/01/2023   Pain of upper abdomen 01/01/2021   Gastroesophageal reflux disease 01/01/2021   Bacterial vaginosis 10/05/2020   Vaginal dysplasia 10/05/2020    PCP: Dow Longs, PA-C  REFERRING PROVIDER: Dow Longs, PA-C  REFERRING DIAG: Low Back Pain  Rationale for Evaluation and Treatment: Rehabilitation  THERAPY DIAG:  Pain in right hip  Other low back pain  Muscle weakness (generalized)  Other muscle spasm  ONSET DATE: Slowly over the Summer  SUBJECTIVE:  SUBJECTIVE STATEMENT: Pt report 3-4/10 right low back pain today.   PERTINENT HISTORY:  Microdiscectomy L4/L5 04/07/23  PAIN:  Are you having pain? Yes: NPRS scale: 3-4/10 Pain location: R low back into posterior hip Pain description: Aching, sore Aggravating factors: Worse at the end of the day or lay on right side, prolonged sitting/standing Relieving factors: ibuprofen , ice, heat, exercise, meds  PRECAUTIONS: No lifting > 5 lbs  RED FLAGS: None   WEIGHT BEARING RESTRICTIONS: No  FALLS:  Has patient fallen in  last 6 months? No  LIVING ENVIRONMENT: Lives with: lives with their family Lives in: House/apartment Stairs: No Has following equipment at home: None  OCCUPATION: Desk work  PLOF: Independent  PATIENT GOALS: Decrease pain, sleep longer than 8 hours, improve movement, less difficulty with home/work activities, stand/sit longer  NEXT MD VISIT: n/a  OBJECTIVE:  Note: Objective measures were completed at Evaluation unless otherwise noted.  DIAGNOSTIC FINDINGS:  No new imaging  PATIENT SURVEYS:  Modified Oswestry:  MODIFIED OSWESTRY DISABILITY SCALE  Date: 04/15/24 Score  Pain intensity 3 =  Pain medication provides me with moderate relief from pain.  2. Personal care (washing, dressing, etc.) 1 =  I can take care of myself normally, but it increases my pain.  3. Lifting 4 = I can lift only very light weights  4. Walking 0 = Pain does not prevent me from walking any distance  5. Sitting 2 =  Pain prevents me from sitting more than 1 hour.  6. Standing 3 =  Pain prevents me from standing more than 1/2 hour.  7. Sleeping 1 = I can sleep well only by using pain medication.  8. Social Life 2 = Pain prevents me from participating in more energetic activities (eg. sports, dancing).  9. Traveling 1 =  I can travel anywhere, but it increases my pain.  10. Employment/ Homemaking 2 = I can perform most of my homemaking/job duties, but pain prevents me from performing more physically stressful activities (eg, lifting, vacuuming).  Total 19/50   Interpretation of scores: Score Category Description  0-20% Minimal Disability The patient can cope with most living activities. Usually no treatment is indicated apart from advice on lifting, sitting and exercise  21-40% Moderate Disability The patient experiences more pain and difficulty with sitting, lifting and standing. Travel and social life are more difficult and they may be disabled from work. Personal care, sexual activity and sleeping are  not grossly affected, and the patient can usually be managed by conservative means  41-60% Severe Disability Pain remains the main problem in this group, but activities of daily living are affected. These patients require a detailed investigation  61-80% Crippled Back pain impinges on all aspects of the patient's life. Positive intervention is required  81-100% Bed-bound These patients are either bed-bound or exaggerating their symptoms  Bluford FORBES Zoe DELENA Karon DELENA, et al. Surgery versus conservative management of stable thoracolumbar fracture: the PRESTO feasibility RCT. [place] ([place]): [employer]; 2021 Nov. [facility] Technology Assessment, No. 25.62.) Appendix 3, Oswestry Disability Index category descriptors. Available from: Findjewelers.cz  Minimally Clinically Important Difference (MCID) = 12.8%  COGNITION: Overall cognitive status: Within functional limits for tasks assessed     SENSATION: WFL  MUSCLE LENGTH: Hamstrings: WNL  POSTURE: No Significant postural limitations  PALPATION: TTP R piriformis Felt good with PA mob on R SIJ for anterior inominate rotation Felt good with R LE distraction  LOWER EXTREMITY ROM:   L hip flexion slightly tighter  than R; R hip ext tighter/more limited than L  LOWER EXTREMITY MMT:    MMT Right eval Left eval  Hip flexion 3+ 3+  Hip extension 3+ 4  Hip abduction 3+ 5  Hip adduction    Hip internal rotation    Hip external rotation    Knee flexion 5 5  Knee extension 5 5  Ankle dorsiflexion    Ankle plantarflexion    Ankle inversion    Ankle eversion     (Blank rows = not tested)  LUMBAR SPECIAL TESTS:  Straight leg raise test: Positive and FABER test: Positive  GAIT: Distance walked: Into clinic Assistive device utilized: None Level of assistance: Complete Independence Comments: Normal reciprocal pattern  TREATMENT DATE:   05/03/24                                 EXERCISE  LOG  Exercise Repetitions and Resistance Comments  Elliptical  Lvl 2 x 12 mins (forward/backward)   Rockerboard 4 mins   Side-stepping Airex x 4 mins   Tandem Gait Airex x 4 mins   Standing Hip Abduction 2# 2 sets of 15 reps    Standing Hip Extension 2# 2 sets of 15 reps   Penguins 2 sets of 30 secs    Blank cell = exercise not performed today   Modalities  Date:  Unattended Estim: Lumbar, IFC 80-150 Hz, 15 mins, Pain and Tone Hot Pack: Lumbar, 15 mins, Pain and Tone   04/21/24 Elliptical L1 x 6 min LEs Posterior inominate MET 5x5 Supine LTR 3x30 Supine figure 4 stretch x30 Supine bridge x10 Supine bridge with ball squeeze x10 Supine clamshell iso against belt 10x5 Supine PPT 10x5 Modalities: Premod e-stim x 15 min along lumbosacral paraspinals with moist heat  04/15/24 See HEP      PATIENT EDUCATION:  Education details: Exam findings, POC, initial HEP Person educated: Patient Education method: Explanation, Demonstration, and Handouts Education comprehension: verbalized understanding, returned demonstration, and needs further education  HOME EXERCISE PROGRAM: Access Code: [email or web address] URL: [email or web address] Date: 04/15/2024 Prepared by: Gellen April Marie Nonato  Exercises - Modified Thomas Stretch  - 1 x daily - 7 x weekly - 2 sets - 30 sec hold - Supine Bridge  - 1 x daily - 7 x weekly - 2 sets - 10 reps - 3-5 sec hold - Hooklying Isometric Clamshell  - 1 x daily - 7 x weekly - 2 sets - 10 reps - 3-5 sec hold  ASSESSMENT:  CLINICAL IMPRESSION: Pt arrives for today's treatment session reporting 4/10 right low back pain.  Pt states that she got a full body massage on Saturday and is feeling much better since then, but does have some muscle soreness today which she was told to expect.  Pt able to tolerate increased time on the elliptical today with fatigue noted.  Pt able to tolerate increased reps with previously performed exercises as well.  Pt  declined STW today due to increased muscle soreness from Saturday's massage.  Normal responses to estim and MH noted upon removal.  Pt denied any pain at completion of today's treatment session.    OBJECTIVE IMPAIRMENTS: decreased activity tolerance, decreased coordination, decreased endurance, decreased mobility, decreased ROM, decreased strength, increased fascial restrictions, increased muscle spasms, improper body mechanics, and pain.     GOALS: Goals reviewed with patient? Yes  SHORT TERM GOALS: Target date: 05/06/2024   Pt will be  ind with HEP Baseline: Goal status: INITIAL  2.  Pt will report reduced pain by >/=50% Baseline:  Goal status: INITIAL    LONG TERM GOALS: Target date: 05/27/2024   Pt will be ind with management and progression of HEP Baseline:  Goal status: INITIAL  2.  Pt will have improved Modified Oswestry to </=7/50 to demo MCID Baseline: 19 Goal status: INITIAL  3.  Pt will report improved overall pain by >/=75% Baseline:  Goal status: INITIAL  4.  Pt will demo improved R hip ext and abd to at least 4/5 for increased standing stability Baseline:  Goal status: INITIAL    PLAN:  PT FREQUENCY: 1-2x/week  PT DURATION: 6 weeks  PLANNED INTERVENTIONS: 97164- PT Re-evaluation, 97750- Physical Performance Testing, 97110-Therapeutic exercises, 97530- Therapeutic activity, [id number]- Neuromuscular re-education, 97535- Self Care, 02859- Manual therapy, [id number]- Gait training, [phone]- Aquatic Therapy, [id number]- Electrical stimulation (unattended), [id number]- Ultrasound, [id number]- Traction (mechanical), [id number] (1-2 muscles), 20561 (3+ muscles)- Dry Needling, Patient/Family education, Taping, Joint mobilization, Spinal mobilization, Cryotherapy, and Moist heat.  [employer] no Ionto**  PLAN FOR NEXT SESSION: Assess response to HEP and modify accordingly. Decrease posterior SI rotation. Strengthen glutes and piriformis. Manual therapy/joint mobs as indicated.    Delon DELENA Gosling, PTA 05/03/2024, 12:03 PM

## 2024-05-06 ENCOUNTER — Ambulatory Visit

## 2024-05-06 DIAGNOSIS — M6281 Muscle weakness (generalized): Secondary | ICD-10-CM

## 2024-05-06 DIAGNOSIS — M25551 Pain in right hip: Secondary | ICD-10-CM | POA: Diagnosis not present

## 2024-05-06 DIAGNOSIS — M62838 Other muscle spasm: Secondary | ICD-10-CM

## 2024-05-06 DIAGNOSIS — M5459 Other low back pain: Secondary | ICD-10-CM

## 2024-05-06 NOTE — Therapy (Signed)
 OUTPATIENT PHYSICAL THERAPY TREATMENT   Patient Name: Maria Velasquez MRN: [id number] DOB:June 14, 1976, 48 y.o., female Today's Date: 05/06/2024  END OF SESSION:  PT End of Session - 05/06/24 1104     Visit Number 6    Number of Visits 12    Date for Recertification  05/27/24    Authorization Type Aetna    PT Start Time 1100    PT Stop Time 1157    PT Time Calculation (min) 57 min    Activity Tolerance Patient tolerated treatment well           Past Medical History:  Diagnosis Date   Cancer (HCC)    Vaginal   GERD (gastroesophageal reflux disease)    Headache    otc med prn   Heartburn    History of cervical dysplasia    History of uterine fibroid    Lipoma    Vaginal dysplasia    Past Surgical History:  Procedure Laterality Date   BIOPSY  02/09/2021   Procedure: BIOPSY;  Surgeon: Cindie Carlin POUR, DO;  [facility]: [facility];  Service: Endoscopy;;   CO2 LASER APPLICATION N/A 10/26/2020   Procedure: CO2 LASER APPLICATION OF VAGINA;  Surgeon: Viktoria Comer SAUNDERS, MD;  Location: [facility];  Service: Gynecology;  Laterality: N/A;   COLONOSCOPY WITH PROPOFOL  N/A 02/09/2021   Procedure: COLONOSCOPY WITH PROPOFOL ;  Surgeon: Cindie Carlin POUR, DO;  [facility]: [facility];  Service: Endoscopy;  Laterality: N/A;  10:00am   DILATION AND CURETTAGE OF UTERUS  08/05/2009  @[facility]    missed abortion   ESOPHAGOGASTRODUODENOSCOPY (EGD) WITH PROPOFOL  N/A 02/09/2021   Procedure: ESOPHAGOGASTRODUODENOSCOPY (EGD) WITH PROPOFOL ;  Surgeon: Cindie Carlin POUR, DO;  [facility];  Service: Endoscopy;  Laterality: N/A;   LAPAROSCOPIC SALPINGO OOPHERECTOMY Right 08-28-2018  @[facility]    ovarian torsion   LAPAROSCOPIC TUBAL LIGATION W/ FILCHIE CLIPS Bilateral 03-20-2010  @[facility]    LUMBAR DISC SURGERY  01-30-2007  @[facility]    L4-- L5   LUMBAR LAMINECTOMY N/A 04/07/2023   Procedure: RIGHT LUMBAR 4-5 MICRODISCECTOMY;  Surgeon: Barbarann Oneil BROCKS, MD;  Location: [facility];  Service: Orthopedics;   Laterality: N/A;  Needs RNFA   ROBOTIC ASSISTED BILATERAL SALPINGO OOPHERECTOMY Bilateral 11/16/2014   Procedure: ROBOTIC ASSISTED BILATERAL SALPINGECTOMY;  Surgeon: Rosaline Luna, MD;  Location: [facility];  Service: Gynecology;  Laterality: Bilateral;   ROBOTIC ASSISTED TOTAL HYSTERECTOMY N/A 11/16/2014   w/ BS   VULVA MARYBETH BIOPSY N/A 10/26/2020   Procedure: VAGINAL BIOPSY;  Surgeon: Viktoria Comer SAUNDERS, MD;  Location: [facility];  Service: Gynecology;  Laterality: N/A;   WISDOM TOOTH EXTRACTION  1997   Patient Active Problem List   Diagnosis Date Noted   Pain in right ankle and joints of right foot 06/12/2023   S/P lumbar microdiscectomy 04/07/2023   Radiculopathy, lumbar region, right L5 01/01/2023   Pain of upper abdomen 01/01/2021   Gastroesophageal reflux disease 01/01/2021   Bacterial vaginosis 10/05/2020   Vaginal dysplasia 10/05/2020    PCP: Dow Longs, PA-C  REFERRING PROVIDER: Dow Longs, PA-C  REFERRING DIAG: Low Back Pain  Rationale for Evaluation and Treatment: Rehabilitation  THERAPY DIAG:  Pain in right hip  Other low back pain  Muscle weakness (generalized)  Other muscle spasm  ONSET DATE: Slowly over the Summer  SUBJECTIVE:  SUBJECTIVE STATEMENT: Pt report 7/10 right low back pain today.   PERTINENT HISTORY:  Microdiscectomy L4/L5 04/07/23  PAIN:  Are you having pain? Yes: NPRS scale: 7/10 Pain location: R low back into posterior hip Pain description: Aching, sore Aggravating factors: Worse at the end of the day or lay on right side, prolonged sitting/standing Relieving factors: ibuprofen , ice, heat, exercise, meds  PRECAUTIONS: No lifting > 5 lbs  RED FLAGS: None   WEIGHT BEARING RESTRICTIONS: No  FALLS:  Has patient fallen in last  6 months? No  LIVING ENVIRONMENT: Lives with: lives with their family Lives in: House/apartment Stairs: No Has following equipment at home: None  OCCUPATION: Desk work  PLOF: Independent  PATIENT GOALS: Decrease pain, sleep longer than 8 hours, improve movement, less difficulty with home/work activities, stand/sit longer  NEXT MD VISIT: n/a  OBJECTIVE:  Note: Objective measures were completed at Evaluation unless otherwise noted.  DIAGNOSTIC FINDINGS:  No new imaging  PATIENT SURVEYS:  Modified Oswestry:  MODIFIED OSWESTRY DISABILITY SCALE  Date: 04/15/24 Score  Pain intensity 3 =  Pain medication provides me with moderate relief from pain.  2. Personal care (washing, dressing, etc.) 1 =  I can take care of myself normally, but it increases my pain.  3. Lifting 4 = I can lift only very light weights  4. Walking 0 = Pain does not prevent me from walking any distance  5. Sitting 2 =  Pain prevents me from sitting more than 1 hour.  6. Standing 3 =  Pain prevents me from standing more than 1/2 hour.  7. Sleeping 1 = I can sleep well only by using pain medication.  8. Social Life 2 = Pain prevents me from participating in more energetic activities (eg. sports, dancing).  9. Traveling 1 =  I can travel anywhere, but it increases my pain.  10. Employment/ Homemaking 2 = I can perform most of my homemaking/job duties, but pain prevents me from performing more physically stressful activities (eg, lifting, vacuuming).  Total 19/50   Interpretation of scores: Score Category Description  0-20% Minimal Disability The patient can cope with most living activities. Usually no treatment is indicated apart from advice on lifting, sitting and exercise  21-40% Moderate Disability The patient experiences more pain and difficulty with sitting, lifting and standing. Travel and social life are more difficult and they may be disabled from work. Personal care, sexual activity and sleeping are not  grossly affected, and the patient can usually be managed by conservative means  41-60% Severe Disability Pain remains the main problem in this group, but activities of daily living are affected. These patients require a detailed investigation  61-80% Crippled Back pain impinges on all aspects of the patient's life. Positive intervention is required  81-100% Bed-bound These patients are either bed-bound or exaggerating their symptoms  Bluford FORBES Zoe DELENA Karon DELENA, et al. Surgery versus conservative management of stable thoracolumbar fracture: the PRESTO feasibility RCT. [place] ([place]): [employer]; 2021 Nov. [facility] Technology Assessment, No. 25.62.) Appendix 3, Oswestry Disability Index category descriptors. Available from: Findjewelers.cz  Minimally Clinically Important Difference (MCID) = 12.8%  COGNITION: Overall cognitive status: Within functional limits for tasks assessed     SENSATION: WFL  MUSCLE LENGTH: Hamstrings: WNL  POSTURE: No Significant postural limitations  PALPATION: TTP R piriformis Felt good with PA mob on R SIJ for anterior inominate rotation Felt good with R LE distraction  LOWER EXTREMITY ROM:   L hip flexion slightly tighter  than R; R hip ext tighter/more limited than L  LOWER EXTREMITY MMT:    MMT Right eval Left eval  Hip flexion 3+ 3+  Hip extension 3+ 4  Hip abduction 3+ 5  Hip adduction    Hip internal rotation    Hip external rotation    Knee flexion 5 5  Knee extension 5 5  Ankle dorsiflexion    Ankle plantarflexion    Ankle inversion    Ankle eversion     (Blank rows = not tested)  LUMBAR SPECIAL TESTS:  Straight leg raise test: Positive and FABER test: Positive  GAIT: Distance walked: Into clinic Assistive device utilized: None Level of assistance: Complete Independence Comments: Normal reciprocal pattern  TREATMENT DATE:   05/06/24                                 EXERCISE  LOG  Exercise Repetitions and Resistance Comments  Elliptical  Lvl 2 x 12 mins (forward/backward)   Rockerboard 5 mins   LTR With PTA over pressure   SKTC With PTA over pressure   Side-stepping    Tandem Gait    Standing Hip Abduction    Standing Hip Extension    Penguins     Blank cell = exercise not performed today   Manual Therapy Soft Tissue Mobilization: Right lumbar and hip, STW/M to right lumbar and hip musculature to decrease pain and tone with pt in left side-lying for comfort   Modalities  Date:  Unattended Estim: Lumbar, IFC 80-150 Hz, 15 mins, Pain and Tone Hot Pack: Lumbar, 15 mins, Pain and Tone   04/21/24 Elliptical L1 x 6 min LEs Posterior inominate MET 5x5 Supine LTR 3x30 Supine figure 4 stretch x30 Supine bridge x10 Supine bridge with ball squeeze x10 Supine clamshell iso against belt 10x5 Supine PPT 10x5 Modalities: Premod e-stim x 15 min along lumbosacral paraspinals with moist heat  04/15/24 See HEP      PATIENT EDUCATION:  Education details: Exam findings, POC, initial HEP Person educated: Patient Education method: Explanation, Demonstration, and Handouts Education comprehension: verbalized understanding, returned demonstration, and needs further education  HOME EXERCISE PROGRAM: Access Code: [email or web address] URL: [email or web address] Date: 04/15/2024 Prepared by: Gellen April Marie Nonato  Exercises - Modified Thomas Stretch  - 1 x daily - 7 x weekly - 2 sets - 30 sec hold - Supine Bridge  - 1 x daily - 7 x weekly - 2 sets - 10 reps - 3-5 sec hold - Hooklying Isometric Clamshell  - 1 x daily - 7 x weekly - 2 sets - 10 reps - 3-5 sec hold  ASSESSMENT:  CLINICAL IMPRESSION: Pt arrives for today's treatment session reporting 7/10 right low back pain.  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  Stretches performed with PTA over pressure  with good results.  Pt reports further relief from STW/M to right lumbar and hip musculature.  Normal responses to estim and MH noted upon removal.  Pt reported 4/10 right low back and hip pain at completion of today's treatment session.   OBJECTIVE IMPAIRMENTS: decreased activity tolerance, decreased coordination, decreased endurance, decreased mobility, decreased ROM, decreased strength, increased fascial restrictions, increased muscle spasms, improper body mechanics, and pain.     GOALS: Goals reviewed with patient? Yes  SHORT TERM GOALS: Target date: 05/06/2024   Pt will be ind with  HEP Baseline: Goal status: MET  2.  Pt will report reduced pain by >/=50% Baseline:  Goal status: MET    LONG TERM GOALS: Target date: 05/27/2024   Pt will be ind with management and progression of HEP Baseline:  Goal status: IN PROGRESS  2.  Pt will have improved Modified Oswestry to </=7/50 to demo MCID Baseline: 19 Goal status: IN PROGRESS  3.  Pt will report improved overall pain by >/=75% Baseline:  Goal status: IN PROGRESS  4.  Pt will demo improved R hip ext and abd to at least 4/5 for increased standing stability Baseline:  Goal status: IN PROGRESS    PLAN:  PT FREQUENCY: 1-2x/week  PT DURATION: 6 weeks  PLANNED INTERVENTIONS: 97164- PT Re-evaluation, 97750- Physical Performance Testing, 97110-Therapeutic exercises, 97530- Therapeutic activity, [id number]- Neuromuscular re-education, 97535- Self Care, [id number]- Manual therapy, [id number]- Gait training, [phone]- Aquatic Therapy, [phone]- Electrical stimulation (unattended), [id number]- Ultrasound, [id number]- Traction (mechanical), [id number] (1-2 muscles), [id number] (3+ muscles)- Dry Needling, Patient/Family education, Taping, Joint mobilization, Spinal mobilization, Cryotherapy, and Moist heat.  [employer] no Ionto**  PLAN FOR NEXT SESSION: Assess response to HEP and modify accordingly. Decrease posterior SI rotation. Strengthen glutes and piriformis. Manual  therapy/joint mobs as indicated.    Delon DELENA Gosling, PTA 05/06/2024, 12:42 PM

## 2024-05-10 ENCOUNTER — Ambulatory Visit

## 2024-05-12 ENCOUNTER — Ambulatory Visit

## 2024-05-12 DIAGNOSIS — M62838 Other muscle spasm: Secondary | ICD-10-CM

## 2024-05-12 DIAGNOSIS — M6281 Muscle weakness (generalized): Secondary | ICD-10-CM

## 2024-05-12 DIAGNOSIS — M25551 Pain in right hip: Secondary | ICD-10-CM

## 2024-05-12 DIAGNOSIS — M5459 Other low back pain: Secondary | ICD-10-CM

## 2024-05-12 NOTE — Therapy (Signed)
 OUTPATIENT PHYSICAL THERAPY TREATMENT   Patient Name: Maria Velasquez MRN: [id number] DOB:1975/06/30, 48 y.o., female Today's Date: 05/12/2024  END OF SESSION:  PT End of Session - 05/12/24 1109     Visit Number 7    Number of Visits 12    Date for Recertification  05/27/24    Authorization Type Aetna    PT Start Time 1100    PT Stop Time 1158    PT Time Calculation (min) 58 min    Activity Tolerance Patient tolerated treatment well           Past Medical History:  Diagnosis Date   Cancer (HCC)    Vaginal   GERD (gastroesophageal reflux disease)    Headache    otc med prn   Heartburn    History of cervical dysplasia    History of uterine fibroid    Lipoma    Vaginal dysplasia    Past Surgical History:  Procedure Laterality Date   BIOPSY  02/09/2021   Procedure: BIOPSY;  Surgeon: Cindie Carlin POUR, DO;  [facility];  Service: Endoscopy;;   CO2 LASER APPLICATION N/A 10/26/2020   Procedure: CO2 LASER APPLICATION OF VAGINA;  Surgeon: Viktoria Comer SAUNDERS, MD;  Location: [facility];  Service: Gynecology;  Laterality: N/A;   COLONOSCOPY WITH PROPOFOL  N/A 02/09/2021   Procedure: COLONOSCOPY WITH PROPOFOL ;  Surgeon: Cindie Carlin POUR, DO;  [facility]: [facility];  Service: Endoscopy;  Laterality: N/A;  10:00am   DILATION AND CURETTAGE OF UTERUS  08/05/2009  @[facility]    missed abortion   ESOPHAGOGASTRODUODENOSCOPY (EGD) WITH PROPOFOL  N/A 02/09/2021   Procedure: ESOPHAGOGASTRODUODENOSCOPY (EGD) WITH PROPOFOL ;  Surgeon: Cindie Carlin POUR, DO;  [facility];  Service: Endoscopy;  Laterality: N/A;   LAPAROSCOPIC SALPINGO OOPHERECTOMY Right 08-28-2018  @[facility]    ovarian torsion   LAPAROSCOPIC TUBAL LIGATION W/ FILCHIE CLIPS Bilateral 03-20-2010  @[facility]    LUMBAR DISC SURGERY  01-30-2007  @[facility]    L4-- L5   LUMBAR LAMINECTOMY N/A 04/07/2023   Procedure: RIGHT LUMBAR 4-5 MICRODISCECTOMY;  Surgeon: Barbarann Oneil BROCKS, MD;  Location: [facility];  Service: Orthopedics;   Laterality: N/A;  Needs RNFA   ROBOTIC ASSISTED BILATERAL SALPINGO OOPHERECTOMY Bilateral 11/16/2014   Procedure: ROBOTIC ASSISTED BILATERAL SALPINGECTOMY;  Surgeon: Rosaline Luna, MD;  Location: [facility];  Service: Gynecology;  Laterality: Bilateral;   ROBOTIC ASSISTED TOTAL HYSTERECTOMY N/A 11/16/2014   w/ BS   VULVA MARYBETH BIOPSY N/A 10/26/2020   Procedure: VAGINAL BIOPSY;  Surgeon: Viktoria Comer SAUNDERS, MD;  Location: [facility];  Service: Gynecology;  Laterality: N/A;   WISDOM TOOTH EXTRACTION  1997   Patient Active Problem List   Diagnosis Date Noted   Pain in right ankle and joints of right foot 06/12/2023   S/P lumbar microdiscectomy 04/07/2023   Radiculopathy, lumbar region, right L5 01/01/2023   Pain of upper abdomen 01/01/2021   Gastroesophageal reflux disease 01/01/2021   Bacterial vaginosis 10/05/2020   Vaginal dysplasia 10/05/2020    PCP: Dow Longs, PA-C  REFERRING PROVIDER: Dow Longs, PA-C  REFERRING DIAG: Low Back Pain  Rationale for Evaluation and Treatment: Rehabilitation  THERAPY DIAG:  Pain in right hip  Other low back pain  Muscle weakness (generalized)  Other muscle spasm  ONSET DATE: Slowly over the Summer  SUBJECTIVE:  SUBJECTIVE STATEMENT: Pt report 3/10 left low back pain.   PERTINENT HISTORY:  Microdiscectomy L4/L5 04/07/23  PAIN:  Are you having pain? Yes: NPRS scale: 3/10 Pain location: L low back Pain description: Aching, sore Aggravating factors: Worse at the end of the day or lay on right side, prolonged sitting/standing Relieving factors: ibuprofen , ice, heat, exercise, meds  PRECAUTIONS: No lifting > 5 lbs  RED FLAGS: None   WEIGHT BEARING RESTRICTIONS: No  FALLS:  Has patient fallen in last 6 months? No  LIVING  ENVIRONMENT: Lives with: lives with their family Lives in: House/apartment Stairs: No Has following equipment at home: None  OCCUPATION: Desk work  PLOF: Independent  PATIENT GOALS: Decrease pain, sleep longer than 8 hours, improve movement, less difficulty with home/work activities, stand/sit longer  NEXT MD VISIT: n/a  OBJECTIVE:  Note: Objective measures were completed at Evaluation unless otherwise noted.  DIAGNOSTIC FINDINGS:  No new imaging  PATIENT SURVEYS:  Modified Oswestry:  MODIFIED OSWESTRY DISABILITY SCALE  Date: 04/15/24 Score  Pain intensity 3 =  Pain medication provides me with moderate relief from pain.  2. Personal care (washing, dressing, etc.) 1 =  I can take care of myself normally, but it increases my pain.  3. Lifting 4 = I can lift only very light weights  4. Walking 0 = Pain does not prevent me from walking any distance  5. Sitting 2 =  Pain prevents me from sitting more than 1 hour.  6. Standing 3 =  Pain prevents me from standing more than 1/2 hour.  7. Sleeping 1 = I can sleep well only by using pain medication.  8. Social Life 2 = Pain prevents me from participating in more energetic activities (eg. sports, dancing).  9. Traveling 1 =  I can travel anywhere, but it increases my pain.  10. Employment/ Homemaking 2 = I can perform most of my homemaking/job duties, but pain prevents me from performing more physically stressful activities (eg, lifting, vacuuming).  Total 19/50   Interpretation of scores: Score Category Description  0-20% Minimal Disability The patient can cope with most living activities. Usually no treatment is indicated apart from advice on lifting, sitting and exercise  21-40% Moderate Disability The patient experiences more pain and difficulty with sitting, lifting and standing. Travel and social life are more difficult and they may be disabled from work. Personal care, sexual activity and sleeping are not grossly affected, and the  patient can usually be managed by conservative means  41-60% Severe Disability Pain remains the main problem in this group, but activities of daily living are affected. These patients require a detailed investigation  61-80% Crippled Back pain impinges on all aspects of the patient's life. Positive intervention is required  81-100% Bed-bound These patients are either bed-bound or exaggerating their symptoms  Bluford FORBES Zoe DELENA Karon DELENA, et al. Surgery versus conservative management of stable thoracolumbar fracture: the PRESTO feasibility RCT. [place] ([place]): [employer]; 2021 Nov. [facility] Technology Assessment, No. 25.62.) Appendix 3, Oswestry Disability Index category descriptors. Available from: Findjewelers.cz  Minimally Clinically Important Difference (MCID) = 12.8%  COGNITION: Overall cognitive status: Within functional limits for tasks assessed     SENSATION: WFL  MUSCLE LENGTH: Hamstrings: WNL  POSTURE: No Significant postural limitations  PALPATION: TTP R piriformis Felt good with PA mob on R SIJ for anterior inominate rotation Felt good with R LE distraction  LOWER EXTREMITY ROM:   L hip flexion slightly tighter than R; R hip  ext tighter/more limited than L  LOWER EXTREMITY MMT:    MMT Right eval Left eval  Hip flexion 3+ 3+  Hip extension 3+ 4  Hip abduction 3+ 5  Hip adduction    Hip internal rotation    Hip external rotation    Knee flexion 5 5  Knee extension 5 5  Ankle dorsiflexion    Ankle plantarflexion    Ankle inversion    Ankle eversion     (Blank rows = not tested)  LUMBAR SPECIAL TESTS:  Straight leg raise test: Positive and FABER test: Positive  GAIT: Distance walked: Into clinic Assistive device utilized: None Level of assistance: Complete Independence Comments: Normal reciprocal pattern  TREATMENT DATE:   05/12/24                                 EXERCISE LOG  Exercise Repetitions and  Resistance Comments  Elliptical  Lvl 2 x 14 mins (forward/backward)   Rockerboard 5 mins   LTR    SKTC    Side-stepping    Tandem Gait    Standing Hip Abduction 2# x 30 reps bil   Standing Hip Extension 2# x 30 reps bil   Penguins     Blank cell = exercise not performed today   Manual Therapy Soft Tissue Mobilization: Left lumbar, STW/M to left lumbar and hip musculature to decrease pain and tone with pt in right side-lying for comfort   Modalities  Date:  Unattended Estim: Lumbar, IFC 80-150 Hz, 15 mins, Pain and Tone Hot Pack: Lumbar, 15 mins, Pain and Tone   04/21/24 Elliptical L1 x 6 min LEs Posterior inominate MET 5x5 Supine LTR 3x30 Supine figure 4 stretch x30 Supine bridge x10 Supine bridge with ball squeeze x10 Supine clamshell iso against belt 10x5 Supine PPT 10x5 Modalities: Premod e-stim x 15 min along lumbosacral paraspinals with moist heat  04/15/24 See HEP      PATIENT EDUCATION:  Education details: Exam findings, POC, initial HEP Person educated: Patient Education method: Explanation, Demonstration, and Handouts Education comprehension: verbalized understanding, returned demonstration, and needs further education  HOME EXERCISE PROGRAM: Access Code: [email or web address] URL: [email or web address] Date: 04/15/2024 Prepared by: Gellen April Marie Nonato  Exercises - Modified Thomas Stretch  - 1 x daily - 7 x weekly - 2 sets - 30 sec hold - Supine Bridge  - 1 x daily - 7 x weekly - 2 sets - 10 reps - 3-5 sec hold - Hooklying Isometric Clamshell  - 1 x daily - 7 x weekly - 2 sets - 10 reps - 3-5 sec hold  ASSESSMENT:  CLINICAL IMPRESSION: Pt arrives for today's treatment session reporting 3/10 left low back pain today.  Pt able to tolerate increased time with elliptical today without increased discomfort.  Pt also able to tolerate increased reps with standing hip exercises without discomfort, but does report increased fatigue.  STW/M performed  to left lumbar paraspinals to decrease pain and tone.  Normal responses to estim and MH noted upon removal.  Pt denied any pain at completion of today's treatment session.   OBJECTIVE IMPAIRMENTS: decreased activity tolerance, decreased coordination, decreased endurance, decreased mobility, decreased ROM, decreased strength, increased fascial restrictions, increased muscle spasms, improper body mechanics, and pain.     GOALS: Goals reviewed with patient? Yes  SHORT TERM GOALS: Target date: 05/06/2024   Pt will be ind with HEP Baseline: Goal status: MET  2.  Pt will report reduced pain by >/=50% Baseline:  Goal status: MET    LONG TERM GOALS: Target date: 05/27/2024   Pt will be ind with management and progression of HEP Baseline:  Goal status: IN PROGRESS  2.  Pt will have improved Modified Oswestry to </=7/50 to demo MCID Baseline: 19 Goal status: IN PROGRESS  3.  Pt will report improved overall pain by >/=75% Baseline:  Goal status: IN PROGRESS  4.  Pt will demo improved R hip ext and abd to at least 4/5 for increased standing stability Baseline:  Goal status: IN PROGRESS    PLAN:  PT FREQUENCY: 1-2x/week  PT DURATION: 6 weeks  PLANNED INTERVENTIONS: 97164- PT Re-evaluation, 97750- Physical Performance Testing, 97110-Therapeutic exercises, 97530- Therapeutic activity, [id number]- Neuromuscular re-education, 97535- Self Care, 02859- Manual therapy, [id number]- Gait training, [phone]- Aquatic Therapy, [phone]- Electrical stimulation (unattended), [id number]- Ultrasound, [id number]- Traction (mechanical), [id number] (1-2 muscles), 20561 (3+ muscles)- Dry Needling, Patient/Family education, Taping, Joint mobilization, Spinal mobilization, Cryotherapy, and Moist heat.  [employer] no Ionto**  PLAN FOR NEXT SESSION: Assess response to HEP and modify accordingly. Decrease posterior SI rotation. Strengthen glutes and piriformis. Manual therapy/joint mobs as indicated.    Delon DELENA Gosling,  PTA 05/12/2024, 12:00 PM

## 2024-05-19 ENCOUNTER — Ambulatory Visit

## 2024-05-19 ENCOUNTER — Ambulatory Visit: Admitting: Cardiology

## 2024-05-19 DIAGNOSIS — M5459 Other low back pain: Secondary | ICD-10-CM

## 2024-05-19 DIAGNOSIS — M25551 Pain in right hip: Secondary | ICD-10-CM | POA: Diagnosis not present

## 2024-05-19 DIAGNOSIS — M6281 Muscle weakness (generalized): Secondary | ICD-10-CM

## 2024-05-19 DIAGNOSIS — M62838 Other muscle spasm: Secondary | ICD-10-CM

## 2024-05-19 NOTE — Therapy (Signed)
 OUTPATIENT PHYSICAL THERAPY TREATMENT   Patient Name: Maria Velasquez MRN: [id number] DOB:1975/10/25, 48 y.o., female Today's Date: 05/19/2024  END OF SESSION:  PT End of Session - 05/19/24 1019     Visit Number 8    Number of Visits 12    Date for Recertification  05/27/24    Authorization Type [employer]etna    PT Start Time 1015    PT Stop Time 1115    PT Time Calculation (min) 60 min    Activity Tolerance Patient tolerated treatment well           Past Medical History:  Diagnosis Date   Cancer (HCC)    Vaginal   GERD (gastroesophageal reflux disease)    Headache    otc med prn   Heartburn    History of cervical dysplasia    History of uterine fibroid    Lipoma    Vaginal dysplasia    Past Surgical History:  Procedure Laterality Date   BIOPSY  02/09/2021   Procedure: BIOPSY;  Surgeon: Cindie Carlin POUR, DO;  [facility];  Service: Endoscopy;;   CO2 LASER APPLICATION N/A 10/26/2020   Procedure: CO2 LASER APPLICATION OF VAGINA;  Surgeon: Viktoria Comer SAUNDERS, MD;  Location: [facility];  Service: Gynecology;  Laterality: N/A;   COLONOSCOPY WITH PROPOFOL  N/A 02/09/2021   Procedure: COLONOSCOPY WITH PROPOFOL ;  Surgeon: Cindie Carlin POUR, DO;  [facility];  Service: Endoscopy;  Laterality: N/A;  10:00am   DILATION AND CURETTAGE OF UTERUS  08/05/2009  @[facility]    missed abortion   ESOPHAGOGASTRODUODENOSCOPY (EGD) WITH PROPOFOL  N/A 02/09/2021   Procedure: ESOPHAGOGASTRODUODENOSCOPY (EGD) WITH PROPOFOL ;  Surgeon: Cindie Carlin POUR, DO;  [facility];  Service: Endoscopy;  Laterality: N/A;   LAPAROSCOPIC SALPINGO OOPHERECTOMY Right 08-28-2018  @[facility]    ovarian torsion   LAPAROSCOPIC TUBAL LIGATION W/ FILCHIE CLIPS Bilateral 03-20-2010  @[facility]    LUMBAR DISC SURGERY  01-30-2007  @[facility]    L4-- L5   LUMBAR LAMINECTOMY N/A 04/07/2023   Procedure: RIGHT LUMBAR 4-5 MICRODISCECTOMY;  Surgeon: Barbarann Oneil BROCKS, MD;  Location: [facility];  Service: Orthopedics;   Laterality: N/A;  Needs RNFA   ROBOTIC ASSISTED BILATERAL SALPINGO OOPHERECTOMY Bilateral 11/16/2014   Procedure: ROBOTIC ASSISTED BILATERAL SALPINGECTOMY;  Surgeon: Rosaline Luna, MD;  Location: [facility];  Service: Gynecology;  Laterality: Bilateral;   ROBOTIC ASSISTED TOTAL HYSTERECTOMY N/A 11/16/2014   w/ BS   VULVA MARYBETH BIOPSY N/A 10/26/2020   Procedure: VAGINAL BIOPSY;  Surgeon: Viktoria Comer SAUNDERS, MD;  Location: [facility];  Service: Gynecology;  Laterality: N/A;   WISDOM TOOTH EXTRACTION  1997   Patient Active Problem List   Diagnosis Date Noted   Pain in right ankle and joints of right foot 06/12/2023   S/P lumbar microdiscectomy 04/07/2023   Radiculopathy, lumbar region, right L5 01/01/2023   Pain of upper abdomen 01/01/2021   Gastroesophageal reflux disease 01/01/2021   Bacterial vaginosis 10/05/2020   Vaginal dysplasia 10/05/2020    PCP: Dow Longs, PA-C  REFERRING PROVIDER: Dow Longs, PA-C  REFERRING DIAG: Low Back Pain  Rationale for Evaluation and Treatment: Rehabilitation  THERAPY DIAG:  Pain in right hip  Other low back pain  Muscle weakness (generalized)  Other muscle spasm  ONSET DATE: Slowly over the Summer  SUBJECTIVE:  SUBJECTIVE STATEMENT: Pt report 4/10 right and 2/10 left low back pain.   PERTINENT HISTORY:  Microdiscectomy L4/L5 04/07/23  PAIN:  Are you having pain? Yes: NPRS scale: 4/10 and 2/10 Pain location: R and L low back Pain description: Aching, sore Aggravating factors: Worse at the end of the day or lay on right side, prolonged sitting/standing Relieving factors: ibuprofen , ice, heat, exercise, meds  PRECAUTIONS: No lifting > 5 lbs  RED FLAGS: None   WEIGHT BEARING RESTRICTIONS: No  FALLS:  Has patient fallen in  last 6 months? No  LIVING ENVIRONMENT: Lives with: lives with their family Lives in: House/apartment Stairs: No Has following equipment at home: None  OCCUPATION: Desk work  PLOF: Independent  PATIENT GOALS: Decrease pain, sleep longer than 8 hours, improve movement, less difficulty with home/work activities, stand/sit longer  NEXT MD VISIT: n/a  OBJECTIVE:  Note: Objective measures were completed at Evaluation unless otherwise noted.  DIAGNOSTIC FINDINGS:  No new imaging  PATIENT SURVEYS:  Modified Oswestry:  MODIFIED OSWESTRY DISABILITY SCALE  Date: 04/15/24 Score  Pain intensity 3 =  Pain medication provides me with moderate relief from pain.  2. Personal care (washing, dressing, etc.) 1 =  I can take care of myself normally, but it increases my pain.  3. Lifting 4 = I can lift only very light weights  4. Walking 0 = Pain does not prevent me from walking any distance  5. Sitting 2 =  Pain prevents me from sitting more than 1 hour.  6. Standing 3 =  Pain prevents me from standing more than 1/2 hour.  7. Sleeping 1 = I can sleep well only by using pain medication.  8. Social Life 2 = Pain prevents me from participating in more energetic activities (eg. sports, dancing).  9. Traveling 1 =  I can travel anywhere, but it increases my pain.  10. Employment/ Homemaking 2 = I can perform most of my homemaking/job duties, but pain prevents me from performing more physically stressful activities (eg, lifting, vacuuming).  Total 19/50   Interpretation of scores: Score Category Description  0-20% Minimal Disability The patient can cope with most living activities. Usually no treatment is indicated apart from advice on lifting, sitting and exercise  21-40% Moderate Disability The patient experiences more pain and difficulty with sitting, lifting and standing. Travel and social life are more difficult and they may be disabled from work. Personal care, sexual activity and sleeping are  not grossly affected, and the patient can usually be managed by conservative means  41-60% Severe Disability Pain remains the main problem in this group, but activities of daily living are affected. These patients require a detailed investigation  61-80% Crippled Back pain impinges on all aspects of the patient's life. Positive intervention is required  81-100% Bed-bound These patients are either bed-bound or exaggerating their symptoms  Bluford FORBES Zoe DELENA Karon DELENA, et al. Surgery versus conservative management of stable thoracolumbar fracture: the PRESTO feasibility RCT. [place] ([place]): [employer]; 2021 Nov. [facility] Psychiatric Technology Assessment, No. 25.62.) Appendix 3, Oswestry Disability Index category descriptors. Available from: Findjewelers.cz  Minimally Clinically Important Difference (MCID) = 12.8%  COGNITION: Overall cognitive status: Within functional limits for tasks assessed     SENSATION: WFL  MUSCLE LENGTH: Hamstrings: WNL  POSTURE: No Significant postural limitations  PALPATION: TTP R piriformis Felt good with PA mob on R SIJ for anterior inominate rotation Felt good with R LE distraction  LOWER EXTREMITY ROM:   L hip  flexion slightly tighter than R; R hip ext tighter/more limited than L  LOWER EXTREMITY MMT:    MMT Right eval Left eval  Hip flexion 3+ 3+  Hip extension 3+ 4  Hip abduction 3+ 5  Hip adduction    Hip internal rotation    Hip external rotation    Knee flexion 5 5  Knee extension 5 5  Ankle dorsiflexion    Ankle plantarflexion    Ankle inversion    Ankle eversion     (Blank rows = not tested)  LUMBAR SPECIAL TESTS:  Straight leg raise test: Positive and FABER test: Positive  GAIT: Distance walked: Into clinic Assistive device utilized: None Level of assistance: Complete Independence Comments: Normal reciprocal pattern  TREATMENT DATE:   05/19/24                                 EXERCISE  LOG  Exercise Repetitions and Resistance Comments  Elliptical  Lvl 2 x 16 mins (forward/backward)   Rockerboard 5 mins   LTR    SKTC    Side-stepping    Tandem Gait    Standing Hip Abduction 3# x 30 reps bil   Standing Hip Extension 3# x 30 reps bil   Penguins     Blank cell = exercise not performed today   Manual Therapy Soft Tissue Mobilization: Left lumbar, STW/M to left lumbar and hip musculature to decrease pain and tone with pt in right side-lying for comfort   Modalities  Date:  Unattended Estim: Lumbar, IFC 80-150 Hz, 15 mins, Pain and Tone Hot Pack: Lumbar, 15 mins, Pain and Tone   04/21/24 Elliptical L1 x 6 min LEs Posterior inominate MET 5x5 Supine LTR 3x30 Supine figure 4 stretch x30 Supine bridge x10 Supine bridge with ball squeeze x10 Supine clamshell iso against belt 10x5 Supine PPT 10x5 Modalities: Premod e-stim x 15 min along lumbosacral paraspinals with moist heat  04/15/24 See HEP      PATIENT EDUCATION:  Education details: Exam findings, POC, initial HEP Person educated: Patient Education method: Explanation, Demonstration, and Handouts Education comprehension: verbalized understanding, returned demonstration, and needs further education  HOME EXERCISE PROGRAM: Access Code: [email or web address] URL: [email or web address] Date: 04/15/2024 Prepared by: Gellen April Earnie Starring  Exercises - Modified Thomas Stretch  - 1 x daily - 7 x weekly - 2 sets - 30 sec hold - Supine Bridge  - 1 x daily - 7 x weekly - 2 sets - 10 reps - 3-5 sec hold - Hooklying Isometric Clamshell  - 1 x daily - 7 x weekly - 2 sets - 10 reps - 3-5 sec hold  ASSESSMENT:  CLINICAL IMPRESSION: Pt arrives for today's treatment session reporting 4/10 right hip and 2/10 left low back pain today.  Pt able to tolerate increased time on the elliptical today without increase in discomfort.  Pt also able to tolerate increased weight with standing hip exercises today without  pain.  STW/M performed to right piriformis region to decrease pain and tone.  Normal responses to estim and MH noted upon removal.  Pt denied any pain at completion of today's treatment session.   OBJECTIVE IMPAIRMENTS: decreased activity tolerance, decreased coordination, decreased endurance, decreased mobility, decreased ROM, decreased strength, increased fascial restrictions, increased muscle spasms, improper body mechanics, and pain.     GOALS: Goals reviewed with patient? Yes  SHORT TERM GOALS: Target date: 05/06/2024   Pt will be  ind with HEP Baseline: Goal status: MET  2.  Pt will report reduced pain by >/=50% Baseline:  Goal status: MET    LONG TERM GOALS: Target date: 05/27/2024   Pt will be ind with management and progression of HEP Baseline:  Goal status: IN PROGRESS  2.  Pt will have improved Modified Oswestry to </=7/50 to demo MCID Baseline: 19 Goal status: IN PROGRESS  3.  Pt will report improved overall pain by >/=75% Baseline:  Goal status: IN PROGRESS  4.  Pt will demo improved R hip ext and abd to at least 4/5 for increased standing stability Baseline:  Goal status: IN PROGRESS    PLAN:  PT FREQUENCY: 1-2x/week  PT DURATION: 6 weeks  PLANNED INTERVENTIONS: 97164- PT Re-evaluation, 97750- Physical Performance Testing, 97110-Therapeutic exercises, 97530- Therapeutic activity, [id number]- Neuromuscular re-education, 97535- Self Care, [id number]- Manual therapy, [id number]- Gait training, [phone]- Aquatic Therapy, [phone]- Electrical stimulation (unattended), [id number]- Ultrasound, [id number]- Traction (mechanical), [id number] (1-2 muscles), 20561 (3+ muscles)- Dry Needling, Patient/Family education, Taping, Joint mobilization, Spinal mobilization, Cryotherapy, and Moist heat.  [employer] no Ionto**  PLAN FOR NEXT SESSION: Assess response to HEP and modify accordingly. Decrease posterior SI rotation. Strengthen glutes and piriformis. Manual therapy/joint mobs as indicated.    Delon DELENA Gosling, PTA 05/19/2024, 11:54 AM

## 2024-05-26 ENCOUNTER — Ambulatory Visit

## 2024-05-28 ENCOUNTER — Ambulatory Visit: Attending: Sports Medicine | Admitting: *Deleted

## 2024-06-02 ENCOUNTER — Ambulatory Visit

## 2024-06-09 ENCOUNTER — Ambulatory Visit

## 2024-07-01 ENCOUNTER — Ambulatory Visit: Admitting: Gastroenterology

## 2024-07-01 VITALS — BP 114/76 | HR 83 | Temp 98.5°F | Ht 63.0 in | Wt 172.8 lb

## 2024-07-01 DIAGNOSIS — R1013 Epigastric pain: Secondary | ICD-10-CM | POA: Insufficient documentation

## 2024-07-01 DIAGNOSIS — R1012 Left upper quadrant pain: Secondary | ICD-10-CM | POA: Diagnosis not present

## 2024-07-01 MED ORDER — PANTOPRAZOLE SODIUM 40 MG PO TBEC: 40.0000 mg | DELAYED_RELEASE_TABLET | Freq: Two times a day (BID) | ORAL | 3 refills | Status: AC

## 2024-07-01 MED ORDER — SUCRALFATE 1 G PO TABS: 1.0000 g | ORAL_TABLET | Freq: Three times a day (TID) | ORAL | 1 refills | Status: AC

## 2024-07-01 NOTE — Patient Instructions (Signed)
 Let's stop omeprazole . Instead, I have sent in pantoprazole  to take twice a day, 30 minutes before meals as it is best absorbed this way.  I also sent in carafate  tablets. You can crush these and mix in a water  to make a slurry or take whole, three times a day and at bedtime. This is just to coat your stomach and will be for a short course.  Recommend limiting/avoiding Ibuprofen , Aleve, Motrin , etc if at all possible.  Please have labs done when you see Erin. We may need to do an upper endoscopy or CT scan.  We will see you in 3 months regardless!  It was a pleasure to see you today. I want to create trusting relationships with patients and provide genuine, compassionate, and quality care. I truly value your feedback, so please be on the lookout for a survey regarding your visit with me today. I appreciate your time in completing this!         Therisa MICAEL Stager, PhD, ANP-BC [facility]

## 2024-07-01 NOTE — Progress Notes (Signed)
 "   Gastroenterology Office Note    Referring Provider: Dow Longs, PA-C Primary Care Physician:  Dow Longs, PA-C  Primary GI: Dr Cindie   Chief Complaint   Chief Complaint  Patient presents with   Follow-up    Follow up on Left side pain. Her left side hurts a lot eating carbs. BM's are very frequent     History of Present Illness   Maria Velasquez is a 49 y.o. female presenting today with a history of LLQ abdominal pain, generalized abdominal pain, chronic GERD, last seen in 2022 and here to re-establish care for further refills. Continued abdominal pain noted.   LUQ pain if eating bread, carbs, worsened when feeling full. If eating bland foods, water , no problem. Can't drink sweet tea without pain. No issues with diet drinks. Mom with history of diverticulosis/diverticulitis. Feels fine 30 minutes to an hour later. No vomiting. Sometimes will feel nauseated. Takes omeprazole  40 mg daily. If misses this, will tell she has indigestion.   Takes Ibuprofen  frequently. Takes daily, 1000 mg to 2000 mg a day. Ibuprofen  is only thing that seems to ache back pain, arthritis.   No constipation. BM 2-3 times per day. Soft.   Colonoscopy Aug 2022: non-bleeding internal hemorrhoids, normal colon.  EGD Aug 2022: gastritis s/p biopsy, normal duodenal bulb and second portion of duodenum. Path: mild chronic gastritis, negative H.pylori.     Past Medical History:  Diagnosis Date   Cancer (HCC)    Vaginal   GERD (gastroesophageal reflux disease)    Headache    otc med prn   Heartburn    History of cervical dysplasia    History of uterine fibroid    Lipoma    Vaginal dysplasia     Past Surgical History:  Procedure Laterality Date   BIOPSY  02/09/2021   Procedure: BIOPSY;  Surgeon: Cindie Carlin POUR, DO;  [facility];  Service: Endoscopy;;   CO2 LASER APPLICATION N/A 10/26/2020   Procedure: CO2 LASER APPLICATION OF VAGINA;  Surgeon: Viktoria Comer SAUNDERS, MD;   Location: [facility];  Service: Gynecology;  Laterality: N/A;   COLONOSCOPY WITH PROPOFOL  N/A 02/09/2021   Procedure: COLONOSCOPY WITH PROPOFOL ;  Surgeon: Cindie Carlin POUR, DO;  [facility];  Service: Endoscopy;  Laterality: N/A;  10:00am   DILATION AND CURETTAGE OF UTERUS  08/05/2009  @[facility]    missed abortion   ESOPHAGOGASTRODUODENOSCOPY (EGD) WITH PROPOFOL  N/A 02/09/2021   Procedure: ESOPHAGOGASTRODUODENOSCOPY (EGD) WITH PROPOFOL ;  Surgeon: Cindie Carlin POUR, DO;  [facility];  Service: Endoscopy;  Laterality: N/A;   LAPAROSCOPIC SALPINGO OOPHERECTOMY Right 08-28-2018  @[facility]    ovarian torsion   LAPAROSCOPIC TUBAL LIGATION W/ FILCHIE CLIPS Bilateral 03-20-2010  @[facility]    LUMBAR DISC SURGERY  01-30-2007  @[facility]    L4-- L5   LUMBAR LAMINECTOMY N/A 04/07/2023   Procedure: RIGHT LUMBAR 4-5 MICRODISCECTOMY;  Surgeon: Barbarann Oneil BROCKS, MD;  Location: [facility];  Service: Orthopedics;  Laterality: N/A;  Needs RNFA   ROBOTIC ASSISTED BILATERAL SALPINGO OOPHERECTOMY Bilateral 11/16/2014   Procedure: ROBOTIC ASSISTED BILATERAL SALPINGECTOMY;  Surgeon: Rosaline Luna, MD;  Location: [facility];  Service: Gynecology;  Laterality: Bilateral;   ROBOTIC ASSISTED TOTAL HYSTERECTOMY N/A 11/16/2014   w/ BS   VULVA MARYBETH BIOPSY N/A 10/26/2020   Procedure: VAGINAL BIOPSY;  Surgeon: Viktoria Comer SAUNDERS, MD;  Location: [facility];  Service: Gynecology;  Laterality: N/A;   WISDOM TOOTH EXTRACTION  1997    Current Outpatient  Medications  Medication Sig Dispense Refill   ALPRAZolam  (XANAX ) 0.5 MG tablet Take 0.5 mg by mouth at bedtime as needed for anxiety or sleep.     estradiol  (ESTRACE ) 0.1 MG/GM vaginal cream Place 1 g vaginally every 14 (fourteen) days.     ibuprofen  (ADVIL ) 400 MG tablet Take 400 mg by mouth every 6 (six) hours as needed.     methocarbamol  (ROBAXIN ) 500 MG tablet Take 1 tablet (500 mg total) by mouth every 6 (six) hours as needed for muscle spasms. 40  tablet 0   omeprazole  (PRILOSEC) 40 MG capsule TAKE ONE CAPSULE BY MOUTH DAILY 30 MINUTES BEFORE breakfast 30 capsule 11   phentermine  (ADIPEX-P ) 37.5 MG tablet Take 37.5 mg by mouth daily. (Patient taking differently: Take 37.5 mg by mouth daily. Takes it occasionally)     No current facility-administered medications for this visit.    Allergies as of 07/01/2024 - Review Complete 07/01/2024  Allergen Reaction Noted   Ciprofloxacin  10/04/2020   Oxycodone Hives, Itching, and Other (See Comments) 11/02/2014   Prednisone Other (See Comments) 08/28/2018    Family History  Problem Relation Age of Onset   Breast cancer Mother    Pancreatic cancer Maternal Grandfather        early 46s   Colon cancer Paternal Grandfather        mid 33s    Social History   Socioeconomic History   Marital status: Married    Spouse name: Not on file   Number of children: Not on file   Years of education: Not on file   Highest education level: Not on file  Occupational History   Not on file  Tobacco Use   Smoking status: Never   Smokeless tobacco: Never  Vaping Use   Vaping status: Never Used  Substance and Sexual Activity   Alcohol use: Yes    Comment: occasional wine   Drug use: Never   Sexual activity: Not Currently    Birth control/protection: Surgical    Comment: hyst  Other Topics Concern   Not on file  Social History Narrative   Not on file   Social Drivers of Health   Tobacco Use: Low Risk (05/19/2024)   Patient History    Smoking Tobacco Use: Never    Smokeless Tobacco Use: Never    Passive Exposure: Not on file  Financial Resource Strain: Not on file  Food Insecurity: Not on file  Transportation Needs: No Transportation Needs (10/04/2021)   Received from [facility]   PRAPARE - Transportation    Lack of Transportation (Medical): No    Lack of Transportation (Non-Medical): No  Physical Activity: Not on file  Stress: Not on file  Social Connections: Not on file   Intimate Partner Violence: Not on file  Depression (EYV7-0): Not on file  Alcohol Screen: Not on file  Housing: Not on file  Utilities: Not on file  Health Literacy: Low Risk (10/04/2021)   Received from [facility] Literacy    How often do you need to have someone help you when you read instructions, pamphlets, or other written material from your doctor or pharmacy?: Never     Review of Systems   Gen: Denies any fever, chills, fatigue, weight loss, lack of appetite.  CV: Denies chest pain, heart palpitations, peripheral edema, syncope.  Resp: Denies shortness of breath at rest or with exertion. Denies wheezing or cough.  GI: Denies dysphagia or odynophagia. Denies jaundice, hematemesis, fecal  incontinence. GU : Denies urinary burning, urinary frequency, urinary hesitancy MS: Denies joint pain, muscle weakness, cramps, or limitation of movement.  Derm: Denies rash, itching, dry skin Psych: Denies depression, anxiety, memory loss, and confusion Heme: Denies bruising, bleeding, and enlarged lymph nodes.   Physical Exam   BP 114/76   Pulse 83   Temp 98.5 F (36.9 C)   Ht 5' 3 (1.6 m)   Wt 172 lb 12.8 oz (78.4 kg)   LMP 10/19/2014   BMI 30.61 kg/m  General:   Alert and oriented. Pleasant and cooperative. Well-nourished and well-developed.  Head:  Normocephalic and atraumatic. Eyes:  Without icterus Ears:  Normal auditory acuity. Lungs:  Clear to auscultation bilaterally.  Heart:  S1, S2 present without murmurs appreciated.  Abdomen:  +BS, soft, non-tender and non-distended. No HSM noted. No guarding or rebound. No masses appreciated.  Rectal:  Deferred  Msk:  Symmetrical without gross deformities. Normal posture. Extremities:  Without edema. Neurologic:  Alert and  oriented x4;  grossly normal neurologically. Skin:  Intact without significant lesions or rashes. Psych:  Alert and cooperative. Normal mood and affect.   Assessment   Maria Velasquez is a 49  y.o. female presenting today with a history of LLQ abdominal pain, generalized abdominal pain, chronic GERD, last seen in 2022 and here to re-establish care for further refills. Continued abdominal pain reported.  LUQ abdominal pain: noted with breads, exacerbated with larger meals, associated nausea. Notably has had increased NSAID use for arthritis and suspect gastritis, unable to rule out PUD in setting of NSAIDs. EGD 2022 with gastritis. Labs as ordered below. Imaging vs EGD to be decided.     PLAN   CBC, CMP, celiac panel, alpha gal panel  Stop omeprazole . Start pantoprazole  BID dosing  Course of Carafate   Avoid NSAIDs  Will likely need EGD vs CT  3 month follow-up regardless  Therisa MICAEL Stager, PhD, ANP-BC [facility]   ADDENDUM: labs done with PCP. IgE low at 3, otherwise normal. Hgb 15, WBC 6.2, platelets normal. Negative celiac disease. Normal CMP.   I recommend EGD with Dr Cindie in the near future.  "

## 2024-07-11 LAB — COMPREHENSIVE METABOLIC PANEL WITH GFR: EGFR: 88

## 2024-07-15 ENCOUNTER — Telehealth: Payer: Self-pay

## 2024-07-15 NOTE — Telephone Encounter (Signed)
 I spoke with the pt and she advised me that she had her labs done at her PCP and they have been scanned to media. The pt was advised you left early today and tomorrow you will not be in the office. Pt states that she checks her MyChart frequently and you can send her results to her that way.

## 2024-07-19 NOTE — Telephone Encounter (Signed)
 Labs unrevealing. Negative celiac panel, alpha gal. Normal CBC and CMP.   Recommend EGD with Dr. Cindie. She has had a hysterectomy, so no pregnancy screen needed. Make sure not to take phentermine  as per protocol. ASA 2.

## 2024-07-21 ENCOUNTER — Encounter: Payer: Self-pay | Admitting: *Deleted

## 2024-07-21 NOTE — Telephone Encounter (Signed)
 Pt has been scheduled for 08/10/24. Instructions sent via mychart.

## 2024-07-21 NOTE — Telephone Encounter (Signed)
 LMOVM to return call.

## 2024-08-06 ENCOUNTER — Encounter (HOSPITAL_COMMUNITY)

## 2024-08-10 ENCOUNTER — Encounter (HOSPITAL_COMMUNITY): Admission: RE | Payer: Self-pay | Source: Home / Self Care

## 2024-08-10 ENCOUNTER — Ambulatory Visit (HOSPITAL_COMMUNITY): Admission: RE | Admit: 2024-08-10 | Source: Home / Self Care | Admitting: Internal Medicine
